# Patient Record
Sex: Female | Born: 1985 | Race: Black or African American | Hispanic: No | Marital: Single | Smoking: Current every day smoker
Health system: Southern US, Community
[De-identification: ages and names within clinical notes are randomized; demographics above are authoritative.]

## PROBLEM LIST (undated history)

## (undated) ENCOUNTER — Inpatient Hospital Stay (HOSPITAL_COMMUNITY): Payer: Self-pay

## (undated) DIAGNOSIS — B9689 Other specified bacterial agents as the cause of diseases classified elsewhere: Secondary | ICD-10-CM

## (undated) DIAGNOSIS — N760 Acute vaginitis: Secondary | ICD-10-CM

## (undated) DIAGNOSIS — R51 Headache: Secondary | ICD-10-CM

## (undated) DIAGNOSIS — A599 Trichomoniasis, unspecified: Secondary | ICD-10-CM

## (undated) DIAGNOSIS — D649 Anemia, unspecified: Secondary | ICD-10-CM

## (undated) DIAGNOSIS — A749 Chlamydial infection, unspecified: Secondary | ICD-10-CM

## (undated) DIAGNOSIS — B999 Unspecified infectious disease: Secondary | ICD-10-CM

## (undated) DIAGNOSIS — J309 Allergic rhinitis, unspecified: Secondary | ICD-10-CM

## (undated) HISTORY — PX: WISDOM TOOTH EXTRACTION: SHX21

---
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
## 2005-03-05 ENCOUNTER — Ambulatory Visit: Payer: Self-pay | Admitting: Family Medicine

## 2005-05-24 ENCOUNTER — Ambulatory Visit: Payer: Self-pay | Admitting: Family Medicine

## 2005-08-30 ENCOUNTER — Ambulatory Visit: Payer: Self-pay | Admitting: Family Medicine

## 2005-11-29 ENCOUNTER — Ambulatory Visit: Payer: Self-pay | Admitting: Family Medicine

## 2006-01-31 ENCOUNTER — Ambulatory Visit: Payer: Self-pay | Admitting: Family Medicine

## 2006-02-18 ENCOUNTER — Ambulatory Visit: Payer: Self-pay | Admitting: Sports Medicine

## 2006-04-19 ENCOUNTER — Encounter (INDEPENDENT_AMBULATORY_CARE_PROVIDER_SITE_OTHER): Payer: Self-pay | Admitting: *Deleted

## 2006-04-19 LAB — CONVERTED CEMR LAB

## 2006-05-05 ENCOUNTER — Ambulatory Visit: Payer: Self-pay | Admitting: Family Medicine

## 2006-05-06 ENCOUNTER — Ambulatory Visit: Payer: Self-pay | Admitting: Family Medicine

## 2006-07-18 ENCOUNTER — Encounter (INDEPENDENT_AMBULATORY_CARE_PROVIDER_SITE_OTHER): Payer: Self-pay | Admitting: *Deleted

## 2006-07-23 ENCOUNTER — Ambulatory Visit: Payer: Self-pay | Admitting: Family Medicine

## 2006-07-29 ENCOUNTER — Telehealth: Payer: Self-pay | Admitting: *Deleted

## 2006-08-06 ENCOUNTER — Telehealth: Payer: Self-pay | Admitting: *Deleted

## 2006-08-27 ENCOUNTER — Encounter (INDEPENDENT_AMBULATORY_CARE_PROVIDER_SITE_OTHER): Payer: Self-pay | Admitting: Family Medicine

## 2006-08-27 ENCOUNTER — Ambulatory Visit: Payer: Self-pay | Admitting: Family Medicine

## 2006-08-27 LAB — CONVERTED CEMR LAB
Chlamydia, DNA Probe: NEGATIVE
Hepatitis B Surface Ag: NEGATIVE
Whiff Test: POSITIVE

## 2006-10-16 ENCOUNTER — Ambulatory Visit: Payer: Self-pay | Admitting: Family Medicine

## 2006-10-17 ENCOUNTER — Telehealth: Payer: Self-pay | Admitting: *Deleted

## 2006-10-17 ENCOUNTER — Emergency Department (HOSPITAL_COMMUNITY): Admission: AC | Admit: 2006-10-17 | Discharge: 2006-10-17 | Payer: Self-pay

## 2007-01-07 ENCOUNTER — Ambulatory Visit: Payer: Self-pay | Admitting: Family Medicine

## 2007-01-16 ENCOUNTER — Telehealth (INDEPENDENT_AMBULATORY_CARE_PROVIDER_SITE_OTHER): Payer: Self-pay | Admitting: *Deleted

## 2007-01-16 ENCOUNTER — Ambulatory Visit: Payer: Self-pay | Admitting: Family Medicine

## 2007-01-16 LAB — CONVERTED CEMR LAB: Whiff Test: POSITIVE

## 2007-03-26 ENCOUNTER — Ambulatory Visit: Payer: Self-pay | Admitting: Family Medicine

## 2007-04-07 ENCOUNTER — Encounter (INDEPENDENT_AMBULATORY_CARE_PROVIDER_SITE_OTHER): Payer: Self-pay | Admitting: Family Medicine

## 2007-04-07 ENCOUNTER — Other Ambulatory Visit: Admission: RE | Admit: 2007-04-07 | Discharge: 2007-04-07 | Payer: Self-pay | Admitting: Family Medicine

## 2007-04-07 ENCOUNTER — Ambulatory Visit: Payer: Self-pay | Admitting: Family Medicine

## 2007-04-07 LAB — CONVERTED CEMR LAB: Whiff Test: POSITIVE

## 2007-05-04 ENCOUNTER — Ambulatory Visit: Payer: Self-pay | Admitting: Family Medicine

## 2007-06-08 ENCOUNTER — Ambulatory Visit: Payer: Self-pay | Admitting: Family Medicine

## 2007-06-08 ENCOUNTER — Telehealth: Payer: Self-pay | Admitting: *Deleted

## 2007-06-08 LAB — CONVERTED CEMR LAB: Beta hcg, urine, semiquantitative: NEGATIVE

## 2007-06-12 ENCOUNTER — Ambulatory Visit: Payer: Self-pay | Admitting: Family Medicine

## 2007-06-12 ENCOUNTER — Encounter: Payer: Self-pay | Admitting: Family Medicine

## 2007-06-12 LAB — CONVERTED CEMR LAB
Hepatitis B Surface Ag: NEGATIVE
Whiff Test: POSITIVE

## 2007-07-15 ENCOUNTER — Telehealth: Payer: Self-pay | Admitting: *Deleted

## 2007-07-16 ENCOUNTER — Encounter (INDEPENDENT_AMBULATORY_CARE_PROVIDER_SITE_OTHER): Payer: Self-pay | Admitting: Family Medicine

## 2007-07-16 ENCOUNTER — Ambulatory Visit: Payer: Self-pay | Admitting: Family Medicine

## 2007-07-16 LAB — CONVERTED CEMR LAB: Chlamydia, DNA Probe: POSITIVE — AB

## 2007-07-17 ENCOUNTER — Encounter: Payer: Self-pay | Admitting: *Deleted

## 2007-07-29 ENCOUNTER — Ambulatory Visit: Payer: Self-pay | Admitting: Family Medicine

## 2007-09-21 ENCOUNTER — Encounter (INDEPENDENT_AMBULATORY_CARE_PROVIDER_SITE_OTHER): Payer: Self-pay | Admitting: *Deleted

## 2007-09-29 ENCOUNTER — Telehealth: Payer: Self-pay | Admitting: *Deleted

## 2007-10-01 ENCOUNTER — Ambulatory Visit: Payer: Self-pay | Admitting: Family Medicine

## 2007-10-26 ENCOUNTER — Ambulatory Visit: Payer: Self-pay | Admitting: Sports Medicine

## 2007-10-26 LAB — CONVERTED CEMR LAB: Beta hcg, urine, semiquantitative: NEGATIVE

## 2007-11-16 ENCOUNTER — Encounter: Payer: Self-pay | Admitting: *Deleted

## 2007-11-23 ENCOUNTER — Encounter (INDEPENDENT_AMBULATORY_CARE_PROVIDER_SITE_OTHER): Payer: Self-pay | Admitting: *Deleted

## 2007-12-02 ENCOUNTER — Encounter: Payer: Self-pay | Admitting: Family Medicine

## 2007-12-02 ENCOUNTER — Ambulatory Visit: Payer: Self-pay | Admitting: Family Medicine

## 2007-12-08 ENCOUNTER — Telehealth: Payer: Self-pay | Admitting: Family Medicine

## 2008-01-18 ENCOUNTER — Ambulatory Visit: Payer: Self-pay | Admitting: Family Medicine

## 2008-01-26 ENCOUNTER — Telehealth: Payer: Self-pay | Admitting: *Deleted

## 2008-01-27 ENCOUNTER — Ambulatory Visit: Payer: Self-pay | Admitting: Family Medicine

## 2008-01-27 ENCOUNTER — Encounter (INDEPENDENT_AMBULATORY_CARE_PROVIDER_SITE_OTHER): Payer: Self-pay | Admitting: Family Medicine

## 2008-01-27 LAB — CONVERTED CEMR LAB: Beta hcg, urine, semiquantitative: NEGATIVE

## 2008-01-28 ENCOUNTER — Telehealth: Payer: Self-pay | Admitting: *Deleted

## 2008-01-29 ENCOUNTER — Ambulatory Visit: Payer: Self-pay | Admitting: Family Medicine

## 2008-02-18 ENCOUNTER — Emergency Department (HOSPITAL_COMMUNITY): Admission: EM | Admit: 2008-02-18 | Discharge: 2008-02-18 | Payer: Self-pay | Admitting: Emergency Medicine

## 2008-04-11 ENCOUNTER — Emergency Department (HOSPITAL_COMMUNITY): Admission: EM | Admit: 2008-04-11 | Discharge: 2008-04-11 | Payer: Self-pay | Admitting: Emergency Medicine

## 2008-04-25 ENCOUNTER — Telehealth: Payer: Self-pay | Admitting: *Deleted

## 2008-04-25 ENCOUNTER — Encounter (INDEPENDENT_AMBULATORY_CARE_PROVIDER_SITE_OTHER): Payer: Self-pay | Admitting: *Deleted

## 2008-05-06 ENCOUNTER — Ambulatory Visit: Payer: Self-pay | Admitting: Family Medicine

## 2008-05-06 ENCOUNTER — Encounter: Payer: Self-pay | Admitting: Family Medicine

## 2008-05-06 LAB — CONVERTED CEMR LAB: GC Probe Amp, Genital: NEGATIVE

## 2008-05-09 ENCOUNTER — Encounter: Payer: Self-pay | Admitting: Family Medicine

## 2008-05-11 ENCOUNTER — Emergency Department (HOSPITAL_COMMUNITY): Admission: EM | Admit: 2008-05-11 | Discharge: 2008-05-11 | Payer: Self-pay | Admitting: Emergency Medicine

## 2008-07-15 ENCOUNTER — Telehealth: Payer: Self-pay | Admitting: *Deleted

## 2008-07-26 ENCOUNTER — Emergency Department (HOSPITAL_COMMUNITY): Admission: EM | Admit: 2008-07-26 | Discharge: 2008-07-26 | Payer: Self-pay | Admitting: Emergency Medicine

## 2008-08-05 ENCOUNTER — Encounter (INDEPENDENT_AMBULATORY_CARE_PROVIDER_SITE_OTHER): Payer: Self-pay | Admitting: Family Medicine

## 2008-08-17 ENCOUNTER — Ambulatory Visit: Payer: Self-pay | Admitting: Family Medicine

## 2008-08-17 ENCOUNTER — Encounter: Payer: Self-pay | Admitting: *Deleted

## 2008-08-17 ENCOUNTER — Encounter (INDEPENDENT_AMBULATORY_CARE_PROVIDER_SITE_OTHER): Payer: Self-pay | Admitting: Family Medicine

## 2008-08-17 LAB — CONVERTED CEMR LAB
Basophils Relative: 0 % (ref 0–1)
Eosinophils Absolute: 0.4 10*3/uL (ref 0.0–0.7)
Lymphs Abs: 2.7 10*3/uL (ref 0.7–4.0)
MCHC: 34.9 g/dL (ref 30.0–36.0)
MCV: 78.8 fL (ref 78.0–100.0)
Neutrophils Relative %: 67 % (ref 43–77)
Platelets: 220 10*3/uL (ref 150–400)
RDW: 13.8 % (ref 11.5–15.5)
WBC: 12 10*3/uL — ABNORMAL HIGH (ref 4.0–10.5)

## 2008-08-18 ENCOUNTER — Encounter (INDEPENDENT_AMBULATORY_CARE_PROVIDER_SITE_OTHER): Payer: Self-pay | Admitting: Family Medicine

## 2008-08-18 ENCOUNTER — Inpatient Hospital Stay (HOSPITAL_COMMUNITY): Admission: AD | Admit: 2008-08-18 | Discharge: 2008-08-18 | Payer: Self-pay | Admitting: Obstetrics & Gynecology

## 2008-08-19 ENCOUNTER — Inpatient Hospital Stay (HOSPITAL_COMMUNITY): Admission: AD | Admit: 2008-08-19 | Discharge: 2008-08-19 | Payer: Self-pay | Admitting: Obstetrics & Gynecology

## 2008-08-22 ENCOUNTER — Telehealth: Payer: Self-pay | Admitting: *Deleted

## 2008-08-25 ENCOUNTER — Emergency Department (HOSPITAL_COMMUNITY): Admission: EM | Admit: 2008-08-25 | Discharge: 2008-08-25 | Payer: Self-pay | Admitting: Emergency Medicine

## 2008-08-25 ENCOUNTER — Telehealth: Payer: Self-pay | Admitting: *Deleted

## 2008-08-26 ENCOUNTER — Ambulatory Visit (HOSPITAL_COMMUNITY): Admission: RE | Admit: 2008-08-26 | Discharge: 2008-08-26 | Payer: Self-pay | Admitting: Family Medicine

## 2008-08-26 ENCOUNTER — Encounter (INDEPENDENT_AMBULATORY_CARE_PROVIDER_SITE_OTHER): Payer: Self-pay | Admitting: Family Medicine

## 2008-08-29 ENCOUNTER — Encounter: Payer: Self-pay | Admitting: *Deleted

## 2008-08-30 ENCOUNTER — Ambulatory Visit: Payer: Self-pay | Admitting: Family Medicine

## 2008-09-08 ENCOUNTER — Inpatient Hospital Stay (HOSPITAL_COMMUNITY): Admission: AD | Admit: 2008-09-08 | Discharge: 2008-09-08 | Payer: Self-pay | Admitting: Family Medicine

## 2008-09-08 ENCOUNTER — Ambulatory Visit: Payer: Self-pay | Admitting: Advanced Practice Midwife

## 2008-09-12 ENCOUNTER — Ambulatory Visit: Payer: Self-pay | Admitting: Family Medicine

## 2008-09-12 ENCOUNTER — Encounter: Payer: Self-pay | Admitting: Family Medicine

## 2008-09-12 ENCOUNTER — Other Ambulatory Visit: Admission: RE | Admit: 2008-09-12 | Discharge: 2008-09-12 | Payer: Self-pay | Admitting: Family Medicine

## 2008-09-12 DIAGNOSIS — O99320 Drug use complicating pregnancy, unspecified trimester: Secondary | ICD-10-CM

## 2008-09-12 DIAGNOSIS — F1910 Other psychoactive substance abuse, uncomplicated: Secondary | ICD-10-CM

## 2008-09-12 DIAGNOSIS — F17200 Nicotine dependence, unspecified, uncomplicated: Secondary | ICD-10-CM

## 2008-09-12 HISTORY — DX: Other psychoactive substance abuse, uncomplicated: F19.10

## 2008-09-12 HISTORY — DX: Drug use complicating pregnancy, unspecified trimester: O99.320

## 2008-09-12 LAB — CONVERTED CEMR LAB
BUN: 8 mg/dL (ref 6–23)
Bilirubin Urine: NEGATIVE
Calcium: 9.8 mg/dL (ref 8.4–10.5)
Creatinine, Ser: 0.72 mg/dL (ref 0.40–1.20)
Potassium: 3.8 meq/L (ref 3.5–5.3)
TSH: 0.07 microintl units/mL — ABNORMAL LOW (ref 0.350–4.500)
Urobilinogen, UA: 1
pH: 6

## 2008-09-13 ENCOUNTER — Encounter: Payer: Self-pay | Admitting: Family Medicine

## 2008-09-14 ENCOUNTER — Ambulatory Visit: Payer: Self-pay | Admitting: Family Medicine

## 2008-09-14 ENCOUNTER — Encounter: Payer: Self-pay | Admitting: Family Medicine

## 2008-09-14 LAB — CONVERTED CEMR LAB: Free T4: 1.71 ng/dL (ref 0.80–1.80)

## 2008-09-19 ENCOUNTER — Ambulatory Visit: Payer: Self-pay | Admitting: Family Medicine

## 2008-09-19 ENCOUNTER — Encounter: Payer: Self-pay | Admitting: Family Medicine

## 2008-09-27 ENCOUNTER — Telehealth (INDEPENDENT_AMBULATORY_CARE_PROVIDER_SITE_OTHER): Payer: Self-pay | Admitting: Family Medicine

## 2008-09-30 ENCOUNTER — Inpatient Hospital Stay (HOSPITAL_COMMUNITY): Admission: AD | Admit: 2008-09-30 | Discharge: 2008-10-01 | Payer: Self-pay | Admitting: Obstetrics & Gynecology

## 2008-10-05 ENCOUNTER — Encounter (INDEPENDENT_AMBULATORY_CARE_PROVIDER_SITE_OTHER): Payer: Self-pay | Admitting: *Deleted

## 2008-10-31 ENCOUNTER — Ambulatory Visit: Payer: Self-pay | Admitting: Family Medicine

## 2008-10-31 ENCOUNTER — Encounter: Payer: Self-pay | Admitting: Family Medicine

## 2008-10-31 LAB — CONVERTED CEMR LAB: GC Probe Amp, Genital: NEGATIVE

## 2008-11-02 ENCOUNTER — Encounter: Payer: Self-pay | Admitting: Family Medicine

## 2008-11-07 ENCOUNTER — Telehealth: Payer: Self-pay | Admitting: Family Medicine

## 2008-11-07 ENCOUNTER — Ambulatory Visit: Payer: Self-pay | Admitting: Family Medicine

## 2008-11-16 ENCOUNTER — Telehealth: Payer: Self-pay | Admitting: Family Medicine

## 2008-11-24 ENCOUNTER — Telehealth: Payer: Self-pay | Admitting: Family Medicine

## 2008-11-25 ENCOUNTER — Encounter: Payer: Self-pay | Admitting: Family Medicine

## 2008-11-25 ENCOUNTER — Telehealth: Payer: Self-pay | Admitting: Family Medicine

## 2008-11-25 ENCOUNTER — Ambulatory Visit: Payer: Self-pay | Admitting: Family Medicine

## 2008-11-25 DIAGNOSIS — R109 Unspecified abdominal pain: Secondary | ICD-10-CM

## 2008-12-05 ENCOUNTER — Ambulatory Visit: Payer: Self-pay | Admitting: Family Medicine

## 2008-12-09 ENCOUNTER — Encounter: Payer: Self-pay | Admitting: Family Medicine

## 2008-12-09 ENCOUNTER — Ambulatory Visit (HOSPITAL_COMMUNITY): Admission: RE | Admit: 2008-12-09 | Discharge: 2008-12-09 | Payer: Self-pay | Admitting: Family Medicine

## 2008-12-15 ENCOUNTER — Telehealth: Payer: Self-pay | Admitting: Family Medicine

## 2008-12-19 ENCOUNTER — Ambulatory Visit: Payer: Self-pay | Admitting: Family Medicine

## 2009-01-04 ENCOUNTER — Telehealth (INDEPENDENT_AMBULATORY_CARE_PROVIDER_SITE_OTHER): Payer: Self-pay | Admitting: *Deleted

## 2009-01-13 ENCOUNTER — Encounter: Payer: Self-pay | Admitting: *Deleted

## 2009-01-18 ENCOUNTER — Encounter: Payer: Self-pay | Admitting: *Deleted

## 2009-01-18 ENCOUNTER — Ambulatory Visit: Payer: Self-pay | Admitting: Family Medicine

## 2009-01-18 ENCOUNTER — Encounter: Payer: Self-pay | Admitting: Family Medicine

## 2009-01-18 LAB — CONVERTED CEMR LAB
HCT: 32 % — ABNORMAL LOW (ref 36.0–46.0)
Hemoglobin: 10.1 g/dL — ABNORMAL LOW (ref 12.0–15.0)
RDW: 14.9 % (ref 11.5–15.5)

## 2009-01-19 ENCOUNTER — Ambulatory Visit (HOSPITAL_COMMUNITY): Admission: RE | Admit: 2009-01-19 | Discharge: 2009-01-19 | Payer: Self-pay | Admitting: Family Medicine

## 2009-01-19 ENCOUNTER — Encounter: Payer: Self-pay | Admitting: Family Medicine

## 2009-01-21 ENCOUNTER — Inpatient Hospital Stay (HOSPITAL_COMMUNITY): Admission: AD | Admit: 2009-01-21 | Discharge: 2009-01-22 | Payer: Self-pay | Admitting: Family Medicine

## 2009-01-21 ENCOUNTER — Ambulatory Visit: Payer: Self-pay | Admitting: Advanced Practice Midwife

## 2009-02-03 ENCOUNTER — Telehealth: Payer: Self-pay | Admitting: Family Medicine

## 2009-02-04 ENCOUNTER — Inpatient Hospital Stay (HOSPITAL_COMMUNITY): Admission: AD | Admit: 2009-02-04 | Discharge: 2009-02-05 | Payer: Self-pay | Admitting: Obstetrics & Gynecology

## 2009-02-04 ENCOUNTER — Ambulatory Visit: Payer: Self-pay | Admitting: Obstetrics and Gynecology

## 2009-02-19 ENCOUNTER — Inpatient Hospital Stay (HOSPITAL_COMMUNITY): Admission: AD | Admit: 2009-02-19 | Discharge: 2009-02-19 | Payer: Self-pay | Admitting: Obstetrics and Gynecology

## 2009-02-19 ENCOUNTER — Ambulatory Visit: Payer: Self-pay | Admitting: Obstetrics and Gynecology

## 2009-02-23 ENCOUNTER — Telehealth: Payer: Self-pay | Admitting: Family Medicine

## 2009-02-24 ENCOUNTER — Ambulatory Visit: Payer: Self-pay | Admitting: Family Medicine

## 2009-02-24 ENCOUNTER — Encounter: Payer: Self-pay | Admitting: Family Medicine

## 2009-03-08 ENCOUNTER — Encounter: Payer: Self-pay | Admitting: Family Medicine

## 2009-03-08 ENCOUNTER — Ambulatory Visit: Payer: Self-pay | Admitting: Family Medicine

## 2009-03-10 ENCOUNTER — Inpatient Hospital Stay (HOSPITAL_COMMUNITY): Admission: AD | Admit: 2009-03-10 | Discharge: 2009-03-11 | Payer: Self-pay | Admitting: Obstetrics & Gynecology

## 2009-03-20 ENCOUNTER — Encounter: Payer: Self-pay | Admitting: Family Medicine

## 2009-03-20 ENCOUNTER — Ambulatory Visit: Payer: Self-pay | Admitting: Family Medicine

## 2009-03-20 DIAGNOSIS — A5901 Trichomonal vulvovaginitis: Secondary | ICD-10-CM | POA: Insufficient documentation

## 2009-03-20 LAB — CONVERTED CEMR LAB
ALT: 11 units/L (ref 0–35)
AST: 16 units/L (ref 0–37)
Albumin: 3.7 g/dL (ref 3.5–5.2)
Bilirubin Urine: NEGATIVE
Blood in Urine, dipstick: NEGATIVE
Calcium: 9.3 mg/dL (ref 8.4–10.5)
Chloride: 105 meq/L (ref 96–112)
GC Probe Amp, Genital: NEGATIVE
Glucose, Urine, Semiquant: NEGATIVE
Platelets: 191 10*3/uL (ref 150–400)
Potassium: 4 meq/L (ref 3.5–5.3)
Protein, U semiquant: NEGATIVE
RDW: 15 % (ref 11.5–15.5)
Sodium: 138 meq/L (ref 135–145)
WBC, UA: >20 cells/hpf
pH: 7

## 2009-03-21 ENCOUNTER — Encounter: Payer: Self-pay | Admitting: Family Medicine

## 2009-03-21 ENCOUNTER — Ambulatory Visit: Payer: Self-pay | Admitting: Physician Assistant

## 2009-03-21 ENCOUNTER — Inpatient Hospital Stay (HOSPITAL_COMMUNITY): Admission: AD | Admit: 2009-03-21 | Discharge: 2009-03-22 | Payer: Self-pay | Admitting: Obstetrics & Gynecology

## 2009-03-27 ENCOUNTER — Encounter: Payer: Self-pay | Admitting: Family Medicine

## 2009-03-27 ENCOUNTER — Ambulatory Visit: Payer: Self-pay | Admitting: Family Medicine

## 2009-04-03 ENCOUNTER — Inpatient Hospital Stay (HOSPITAL_COMMUNITY): Admission: AD | Admit: 2009-04-03 | Discharge: 2009-04-03 | Payer: Self-pay | Admitting: Obstetrics & Gynecology

## 2009-04-03 ENCOUNTER — Telehealth: Payer: Self-pay | Admitting: *Deleted

## 2009-04-04 ENCOUNTER — Encounter: Payer: Self-pay | Admitting: Family Medicine

## 2009-04-04 ENCOUNTER — Inpatient Hospital Stay (HOSPITAL_COMMUNITY): Admission: AD | Admit: 2009-04-04 | Discharge: 2009-04-06 | Payer: Self-pay | Admitting: Family Medicine

## 2009-04-04 ENCOUNTER — Ambulatory Visit: Payer: Self-pay | Admitting: Family Medicine

## 2009-04-17 ENCOUNTER — Inpatient Hospital Stay (HOSPITAL_COMMUNITY): Admission: AD | Admit: 2009-04-17 | Discharge: 2009-04-17 | Payer: Self-pay | Admitting: Obstetrics & Gynecology

## 2009-07-02 ENCOUNTER — Emergency Department (HOSPITAL_COMMUNITY): Admission: EM | Admit: 2009-07-02 | Discharge: 2009-07-02 | Payer: Self-pay | Admitting: Family Medicine

## 2009-07-10 ENCOUNTER — Ambulatory Visit: Payer: Self-pay | Admitting: Family Medicine

## 2009-07-19 ENCOUNTER — Telehealth: Payer: Self-pay | Admitting: *Deleted

## 2009-08-19 IMAGING — US US OB COMP +14 WK
2 of 3 series · 14 of 28 positions shown · non-contrast
Comparison: none

OBSTETRICAL ULTRASOUND:
 This ultrasound exam was performed in the [HOSPITAL] Ultrasound Department.  The OB US report was generated in the AS system, and faxed to the ordering physician.  This report is also available in [REDACTED] PACS.

[Series 1: us ob comp +14 wk · 0.21mm/px · 76 acquisitions, 13 frames shown (1 of 2)]
[im 4/76]
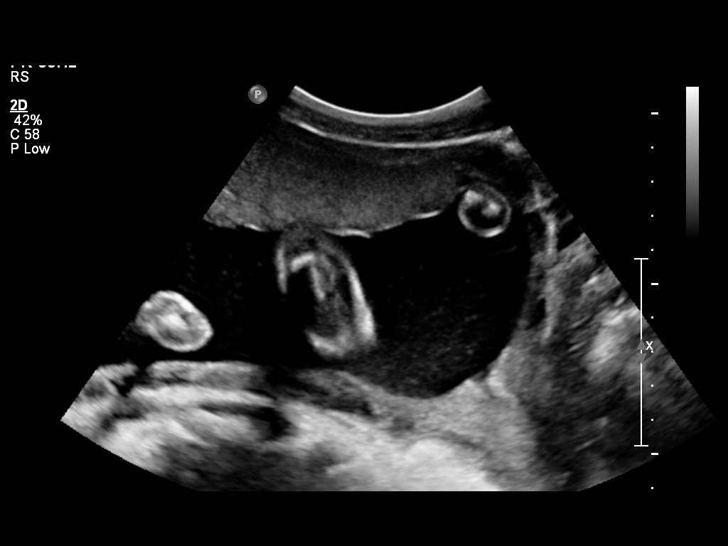
[im 10/76]
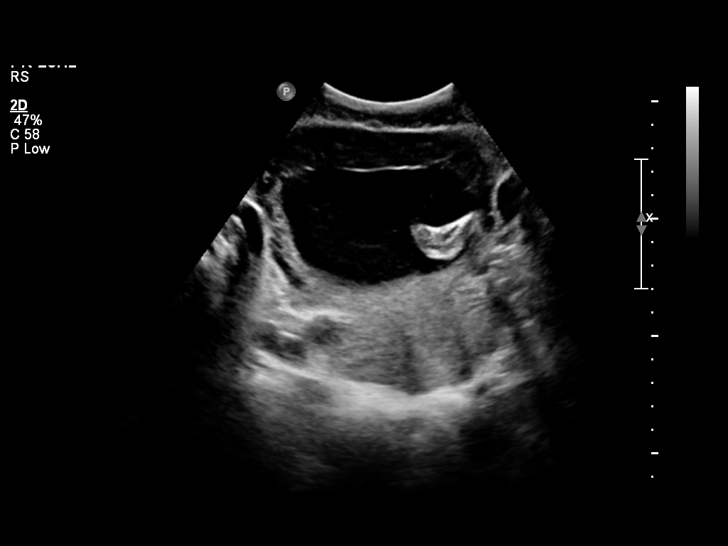
[im 16/76]
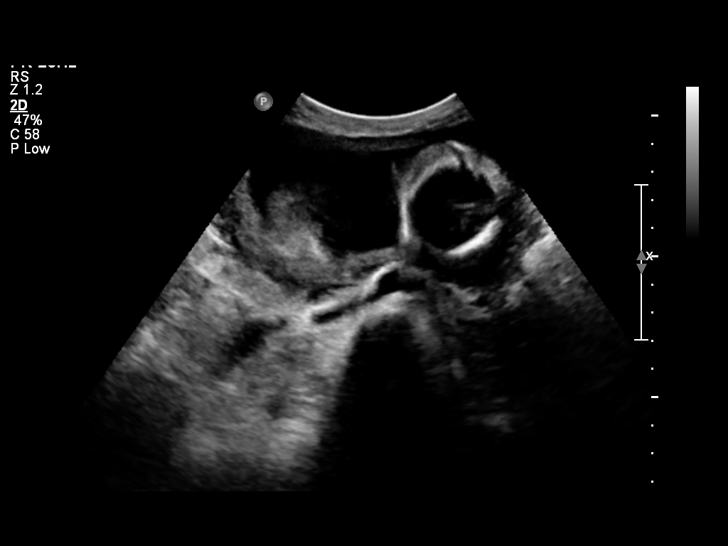
[im 22/76]
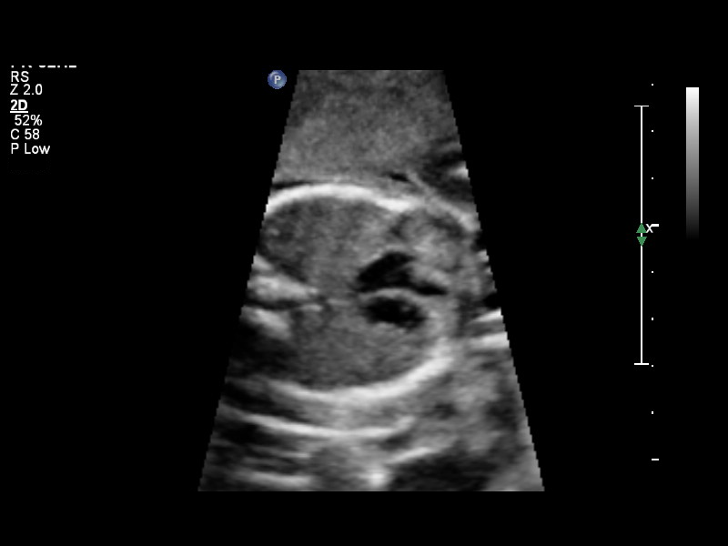
[im 28/76]
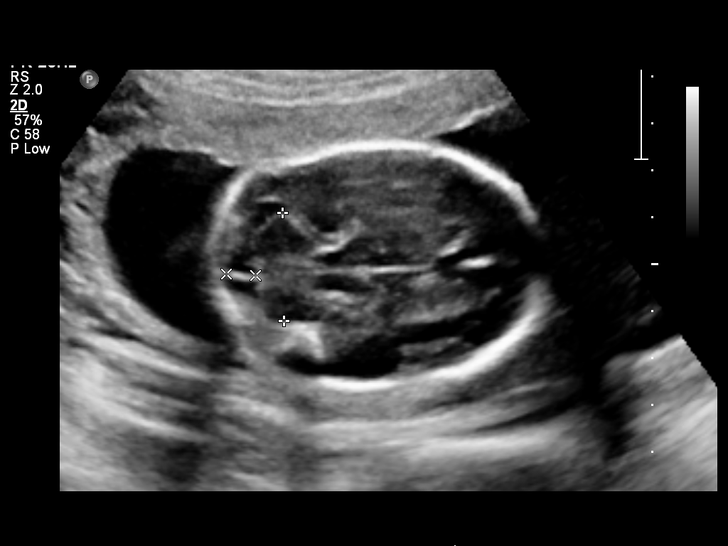
[im 34/76]
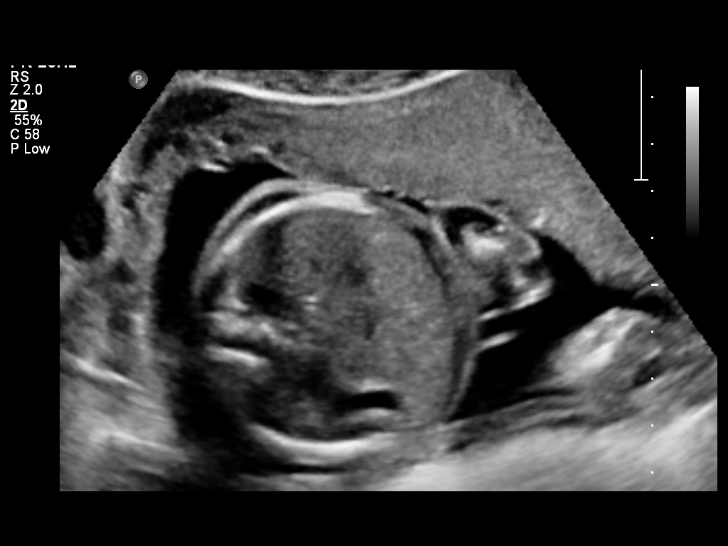
[im 40/76]
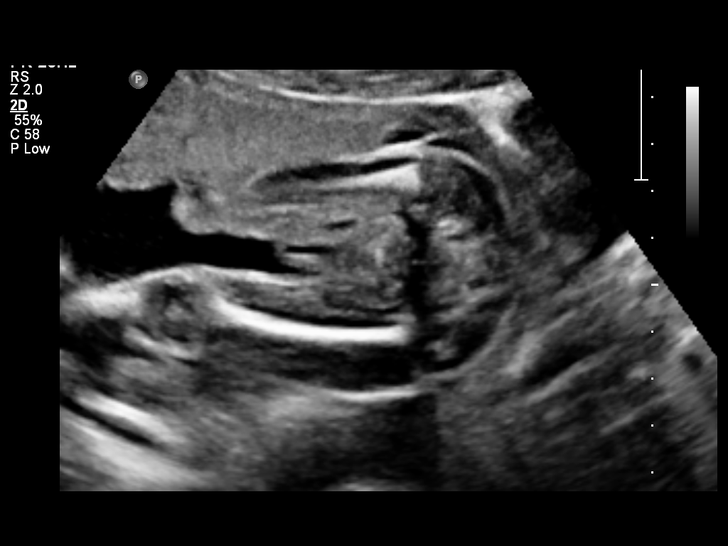
[im 46/76]
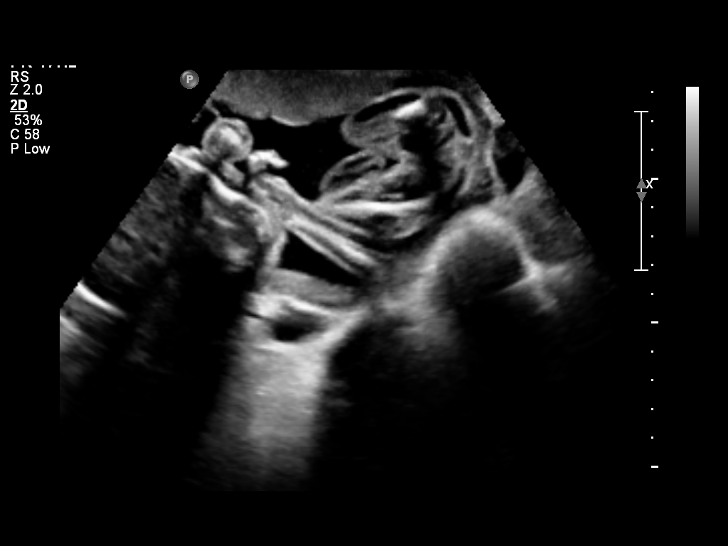
[im 52/76]
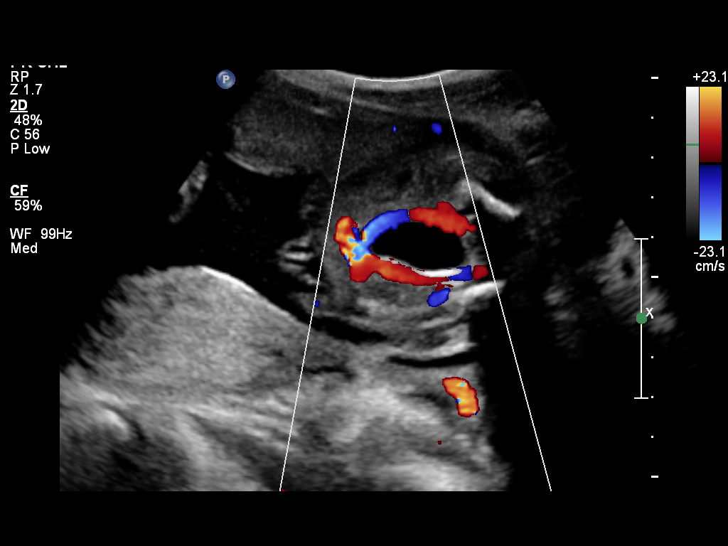
[im 58/76]
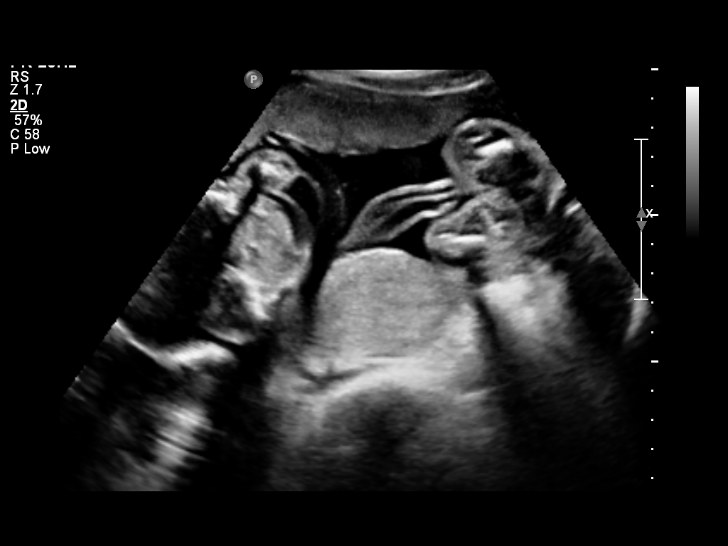
[im 64/76]
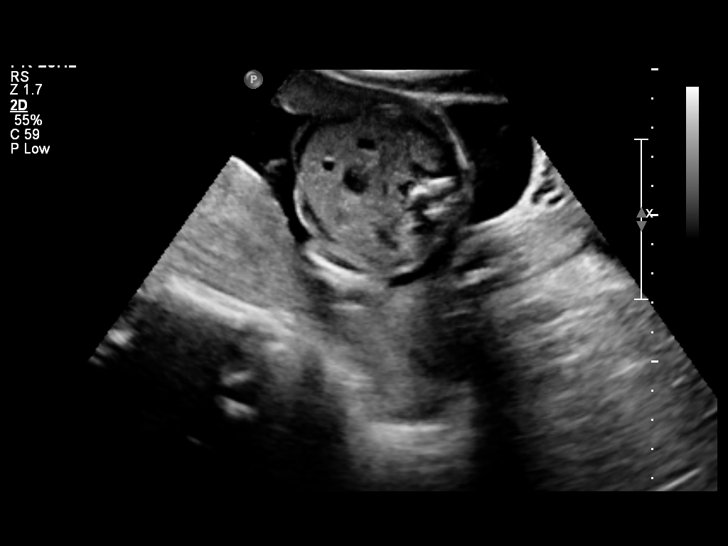
[im 70/76]
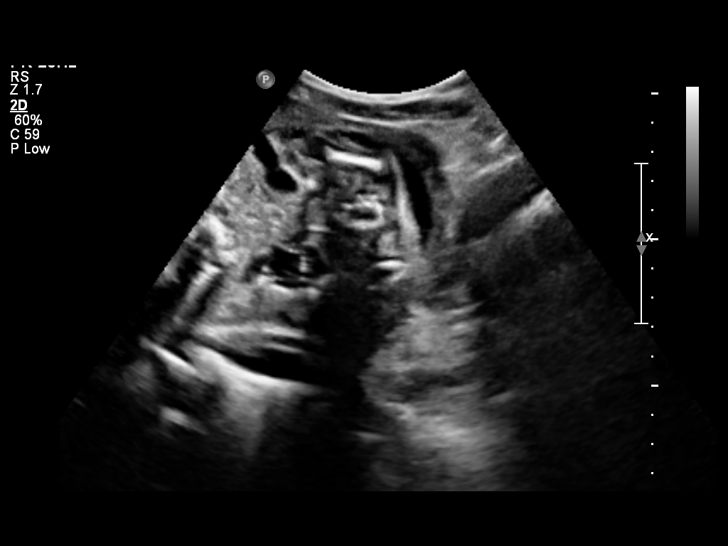
[im 76/76]
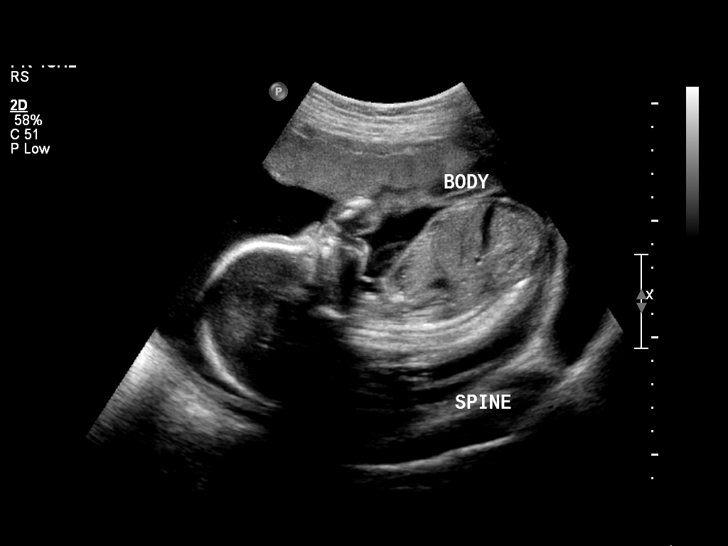

[Series 1: us ob comp +14 wk · 0.21mm/px · 1 of 3 slices shown (2 of 2)]
[im 1/3]
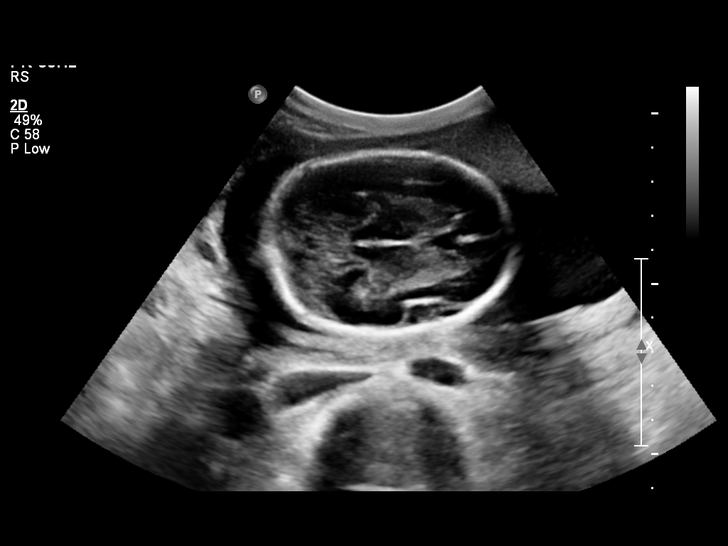

[14 of 28 positions shown; findings below may reference images not displayed]

IMPRESSION: See AS Obstetric US report.

## 2009-10-02 IMAGING — CR DG FOOT 2V*L*
2 series · 2 of 2 positions shown · non-contrast
Comparison: 09/02/2004.  Left ankle series from the same day.

CLINICAL DATA: 22-year-old female status post fall with pain and
swelling at the top the left foot.  29 weeks pregnant.

LEFT FOOT - 2 VIEW

[view not recorded (1 of 2)]
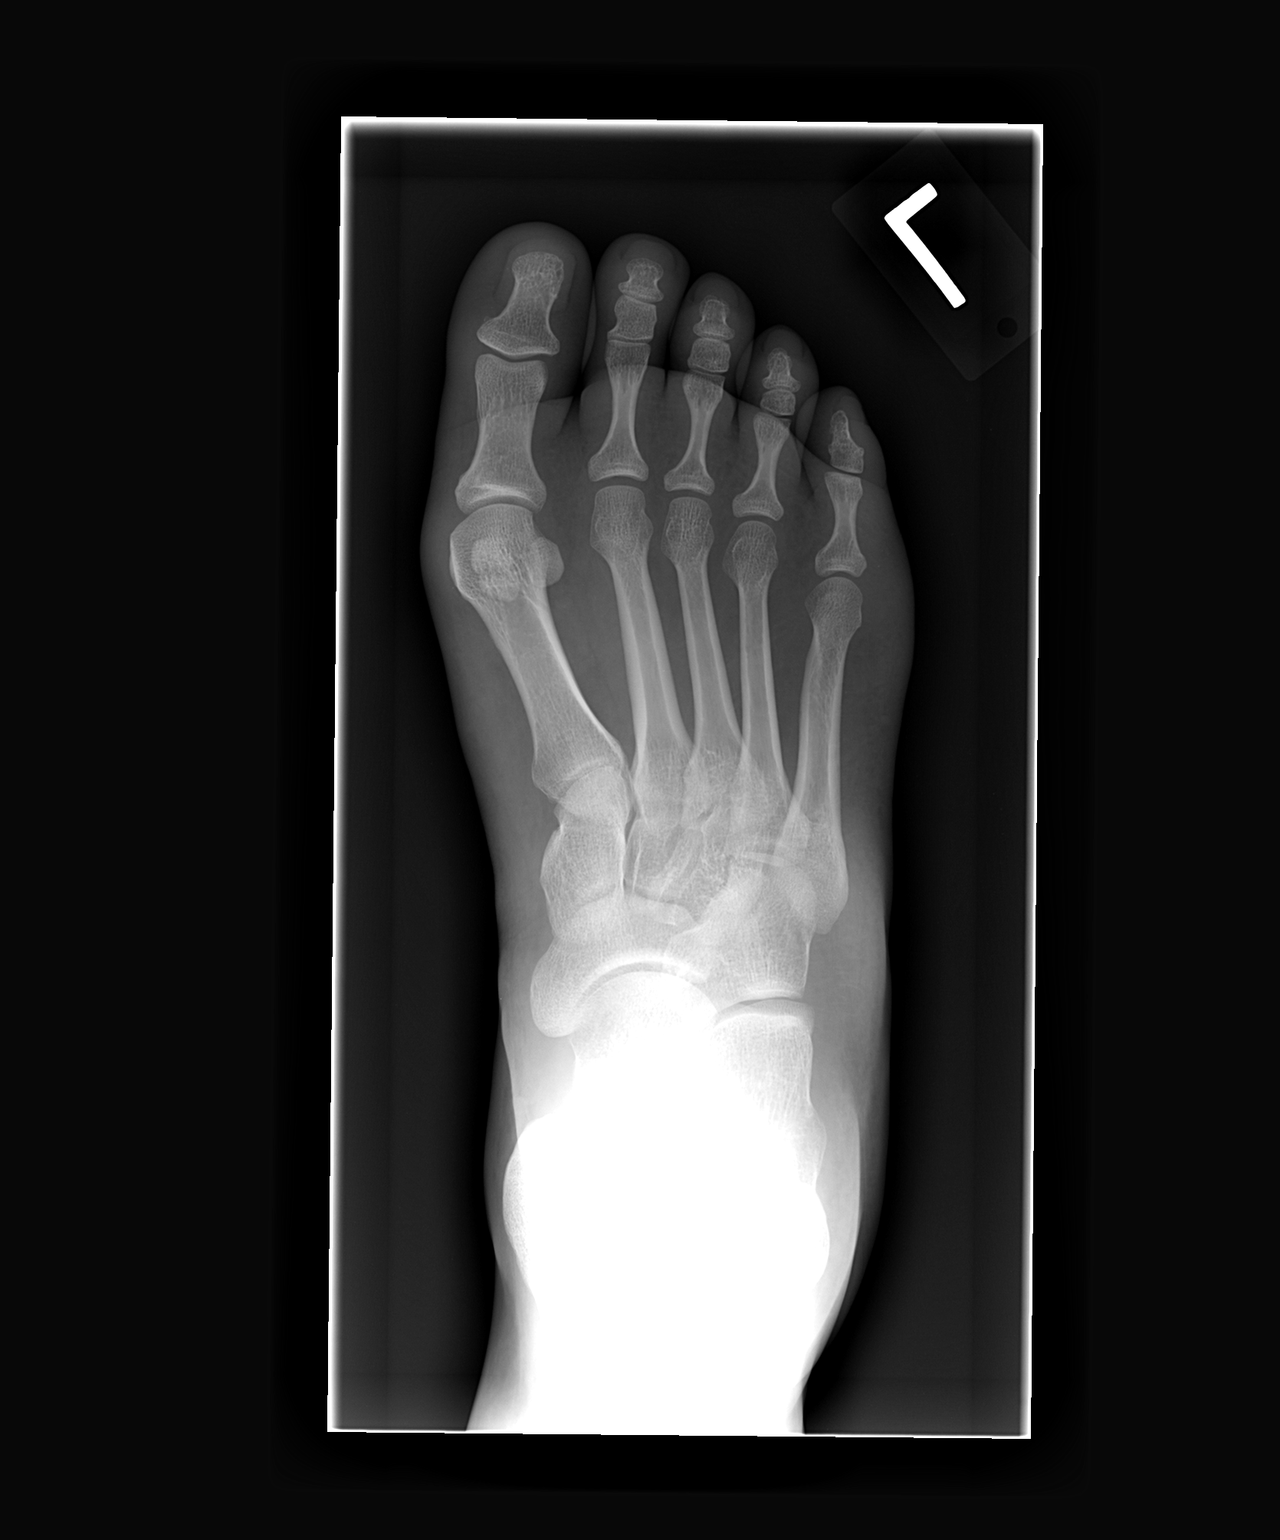

[view not recorded (2 of 2)]
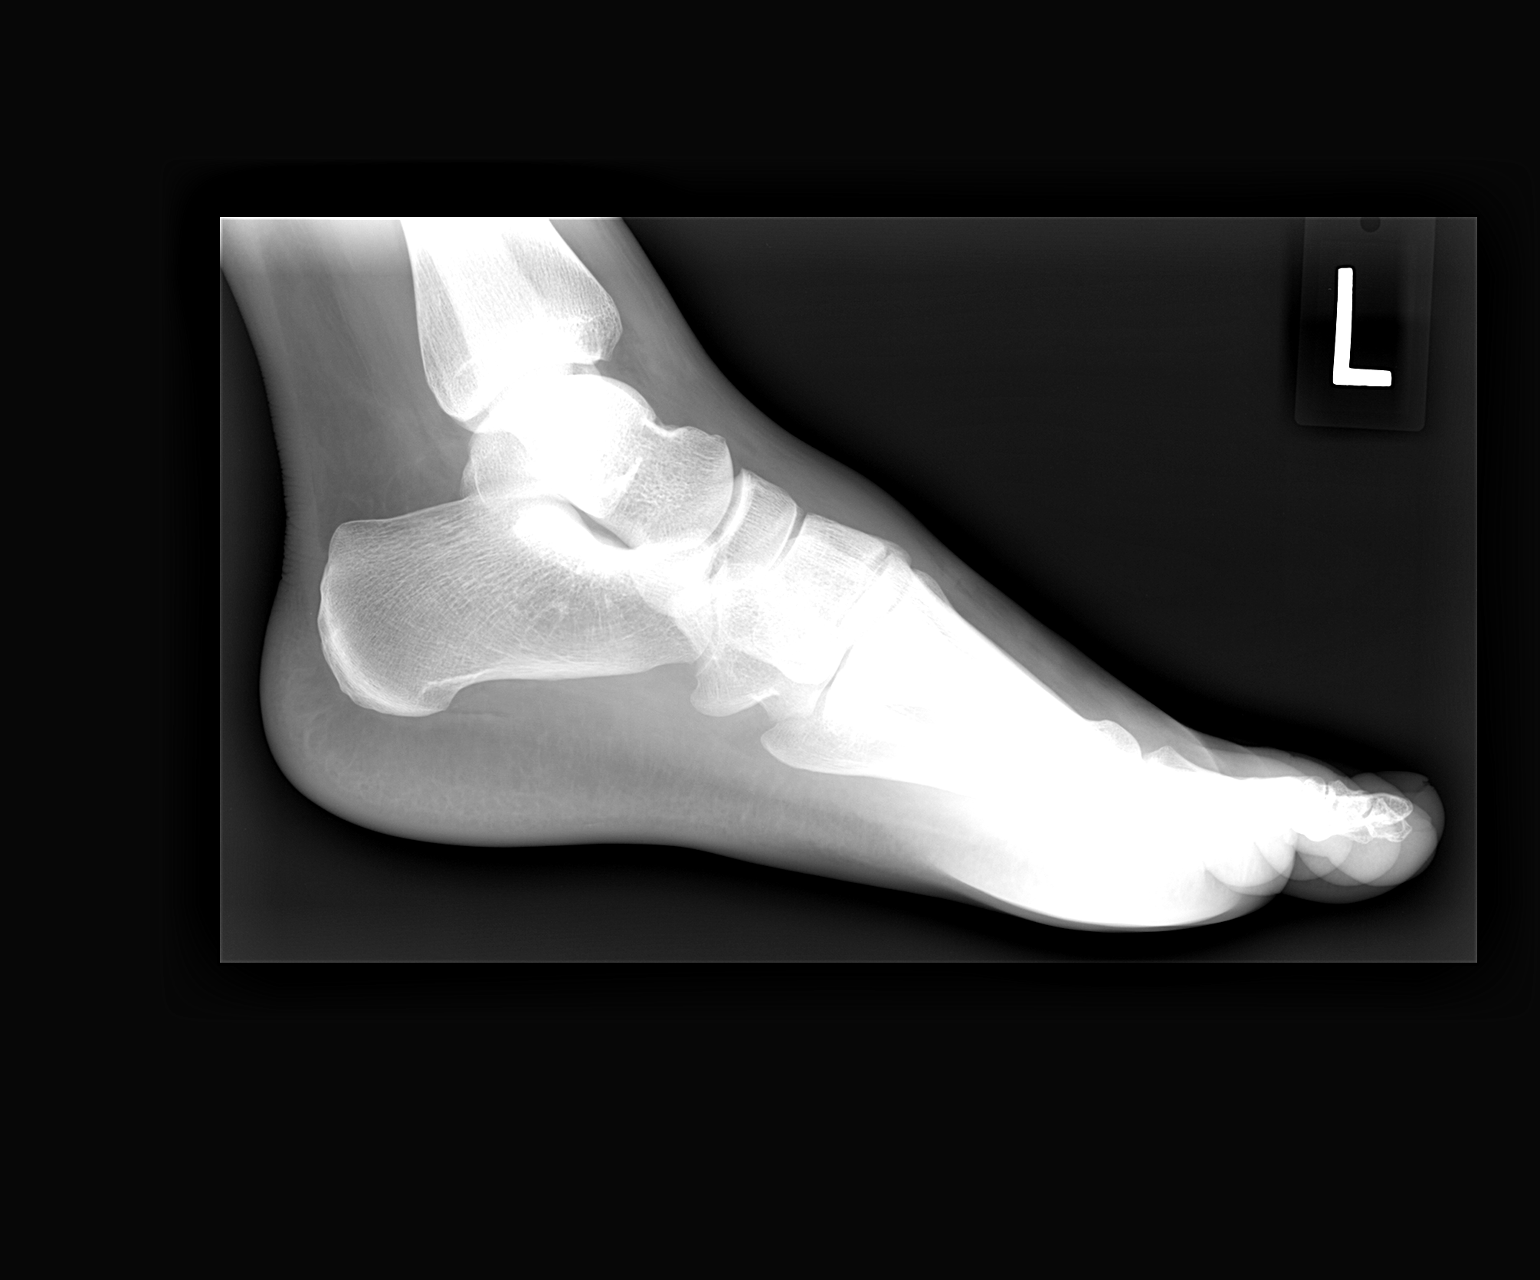

[2 of 2 positions shown; findings below may reference images not displayed]

FINDINGS: Bone mineralization is within normal limits.  Joint
spaces are normal.  There is soft tissue swelling at the dorsal
foot overlying the level of the tarsal bones.  No acute fracture
identified.  Calcaneus appears intact.
IMPRESSION: Soft tissue swelling. No acute fracture or dislocation identified
about the left foot.

## 2009-10-13 ENCOUNTER — Ambulatory Visit: Payer: Self-pay | Admitting: Family Medicine

## 2009-12-15 ENCOUNTER — Encounter: Payer: Self-pay | Admitting: *Deleted

## 2009-12-18 ENCOUNTER — Encounter: Payer: Self-pay | Admitting: *Deleted

## 2009-12-27 ENCOUNTER — Emergency Department (HOSPITAL_COMMUNITY): Admission: EM | Admit: 2009-12-27 | Discharge: 2009-12-28 | Payer: Self-pay | Admitting: Emergency Medicine

## 2009-12-28 ENCOUNTER — Ambulatory Visit: Payer: Self-pay | Admitting: Family Medicine

## 2010-03-26 ENCOUNTER — Ambulatory Visit: Payer: Self-pay | Admitting: Family Medicine

## 2010-03-26 ENCOUNTER — Encounter: Payer: Self-pay | Admitting: *Deleted

## 2010-04-18 ENCOUNTER — Encounter: Payer: Self-pay | Admitting: Family Medicine

## 2010-04-18 ENCOUNTER — Other Ambulatory Visit
Admission: RE | Admit: 2010-04-18 | Discharge: 2010-04-18 | Payer: Self-pay | Source: Home / Self Care | Admitting: Family Medicine

## 2010-04-18 ENCOUNTER — Ambulatory Visit: Payer: Self-pay | Admitting: Family Medicine

## 2010-04-18 DIAGNOSIS — N760 Acute vaginitis: Secondary | ICD-10-CM | POA: Insufficient documentation

## 2010-04-18 LAB — CONVERTED CEMR LAB
Chlamydia, DNA Probe: NEGATIVE
HIV: NONREACTIVE
Whiff Test: POSITIVE

## 2010-04-20 ENCOUNTER — Encounter: Payer: Self-pay | Admitting: Family Medicine

## 2010-06-10 ENCOUNTER — Encounter: Payer: Self-pay | Admitting: Family Medicine

## 2010-06-11 ENCOUNTER — Encounter: Payer: Self-pay | Admitting: Family Medicine

## 2010-06-13 ENCOUNTER — Ambulatory Visit: Admit: 2010-06-13 | Payer: Self-pay

## 2010-06-21 NOTE — Letter (Signed)
Summary: Probation Letter  [facility]  [address]   [place], [place] [address]   Phone: [phone]  Fax: [phone]    12/18/2009  Buffie IYLA BALZARINI [address] [place], [place]  [address]  Dear Ms. Rudin,  With the goal of better serving all our patients the [facility] is following each patient's missed appointments.  You have missed at least 3 appointments with our practice.If you cannot keep your appointment, we expect you to call at least 24 hours before your appointment time.  Missing appointments prevents other patients from seeing [place] and makes it difficult to provide you with the best possible medical care.      1.   If you miss one more appointment, we will only give you limited medical services. This means we will not call in medication refills, complete a form, or make a referral for you except when you are here for a scheduled office visit.    2.   If you miss 2 or more appointments in the next year, we will dismiss you from our practice.    Our office staff can be reached at [phone] Monday through Friday from 8:30 a.m.-5:00 p.m. and will be glad to schedule your appointment as necessary.    Thank you.   [facility]

## 2010-06-21 NOTE — Progress Notes (Signed)
Summary: triage  Phone Note Call from Patient Call back at Home Phone [phone]   Caller: Patient Summary of Call: Pt is pregnant and wondering if she can take Extra Strength Tylenol Initial call taken by: Clydell Hakim,  July 19, 2009 3:33 PM  Follow-up for Phone Call        Told her that ES Tylenol was ok. Follow-up by: Dennison Nancy RN,  July 19, 2009 4:10 PM

## 2010-06-21 NOTE — Letter (Signed)
Summary: Suspension Letter  [facility]  [address]   [place], [place] [address]   Phone: [phone]  Fax: [phone]    03/26/2010  Erika Porter [address] [name], [place]  [address]  Dear Ms. Parthasarathy,  You have missed 4 scheduled appointments with our practice.If you cannot keep your appointment, we expect you to call and cancel at least 24 hours before your appointment time.  As per our policy, we will now only give you limited medical services. means we will not call in a refill for you, or complete a form or make a referral except when you are here for a scheduled office visit.   If you miss 2 more appointments in the next year, we will dismiss you from our practice.  We hope this does not happen.  If you keep your appointments for the next year you will be returned to regular patient status.  We hope these changes will encourage you to keep your appointments so we may provide you the best medical care.   Our office staff can be reached at [phone] Monday through Friday from 8:30 a.m.-5:00 p.m. and will be glad to schedule your appointment as necessary.     Sincerely,   [facility]

## 2010-06-21 NOTE — Miscellaneous (Signed)
Summary: PT NEEDS APPT/TS  Clinical Lists Changes called pt x 3. sounds like a child picks up the phone, but does not talk or respond...will try again later. pt has been missing several appt's and need to be seen asap.Arlyss Repress CMA,  December 15, 2009 10:24 AM called pt again. same situation.Arlyss Repress CMA,  December 15, 2009 12:02 PM

## 2010-06-21 NOTE — Assessment & Plan Note (Signed)
Summary: depo inj,tcb  Nurse Visit   Vitals Entered By: Arlyss Repress CMA, (December 28, 2009 1:57 PM)  Allergies: No Known Drug Allergies  Medication Administration  Injection # 1:    Medication: Depo-Provera 150mg     Diagnosis: CONTRACEPTION COUNSELING/FAMILY PLANNING (ICD-V25.09)    Route: IM    Site: L thigh    Exp Date: 07/18/2012    Lot #: [id number]    Mfr: [email or web address]    Comments: NEXT DEPO DUE OCT 29 THROUGH NOV 01/2010. REMINDER CARD GIVEN TO PT.    Patient tolerated injection without complications    Given by: Arlyss Repress CMA, (December 28, 2009 2:00 PM)  Orders Added: 1)  Depo-Provera 150mg  [J1055] 2)  Est Level 1- [facility] [[id number]]   Medication Administration  Injection # 1:    Medication: Depo-Provera 150mg     Diagnosis: CONTRACEPTION COUNSELING/FAMILY PLANNING (ICD-V25.09)    Route: IM    Site: L thigh    Exp Date: 07/18/2012    Lot #: [id number]    Mfr: [email or web address]    Comments: NEXT DEPO DUE OCT 29 THROUGH NOV 01/2010. REMINDER CARD GIVEN TO PT.    Patient tolerated injection without complications    Given by: Arlyss Repress CMA, (December 28, 2009 2:00 PM)  Orders Added: 1)  Depo-Provera 150mg  [J1055] 2)  Est Level 1- [facility] [[id number]]

## 2010-06-21 NOTE — Assessment & Plan Note (Signed)
Summary: depo,df  Nurse Visit   Allergies: No Known Drug Allergies  Medication Administration  Injection # 1:    Medication: Depo-Provera 150mg     Diagnosis: CONTRACEPTION COUNSELING/FAMILY PLANNING (ICD-V25.09)    Route: IM    Site: L deltoid    Exp Date: 08/2011    Lot #: [id number]    Mfr: Pharmacia    Comments: next depo due August 12 thru January 12, 2010    Patient tolerated injection without complications    Given by: Theresia Lo RN (Oct 13, 2009 8:45 AM)  Orders Added: 1)  Depo-Provera 150mg  [J1055] 2)  Admin of Injection (IM/SQ) [82956]   Medication Administration  Injection # 1:    Medication: Depo-Provera 150mg     Diagnosis: CONTRACEPTION COUNSELING/FAMILY PLANNING (ICD-V25.09)    Route: IM    Site: L deltoid    Exp Date: 08/2011    Lot #: [id number]    Mfr: Pharmacia    Comments: next depo due August 12 thru January 12, 2010    Patient tolerated injection without complications    Given by: Theresia Lo RN (Oct 13, 2009 8:45 AM)  Orders Added: 1)  Depo-Provera 150mg  [J1055] 2)  Admin of Injection (IM/SQ) [[id number]]

## 2010-06-21 NOTE — Letter (Signed)
Summary: Results Follow-up Letter  [facility]  [address]   [place], [place] [address]   Phone: [phone]  Fax: [phone]    04/20/2010  [address] [name], [place]  [address]  Dear Ms. Erika Porter,   I have been trying to call you, but your phone number is disconnected.  The following are the results of your recent test(s):  Test     Result     Pap Smear    Normal  Vaginal fluid smear: you have a bacterial infection, called bacterial vaginosis.  This is not a sexually transmitted disease.  This can be treated with an antibiotic called Metronidazole 500mg  by mouth two times a day for 7 days. I have sent a prescription to your pharmacy.  HIV: negative  Syphilis: negative  Gonorrhea: negative  Chlamydia: negative    Sincerely,  Cat Ta MD [facility]           Appended Document: Results Follow-up Letter mailed

## 2010-06-21 NOTE — Assessment & Plan Note (Signed)
Summary: pp ck,df   Vital Signs:  Patient profile:   25 year old female Height:      66 inches Weight:      147.8 pounds BMI:     23.94 Temp:     98.1 degrees F oral Pulse rate:   123 / minute BP sitting:   113 / 74  (right arm) Cuff size:   regular  Vitals Entered By: Garen Grams LPN (July 10, 2009 3:09 PM) CC: postpartum check Is Patient Diabetic? No Pain Assessment Patient in pain? no        Primary Care Provider:  Lupita Raider MD  CC:  postpartum check.  History of Present Illness: 1. Postpartum check: -Contraception: received depo while in the hospital would like to continue that -Vaginal bleeding: none -Mood: not depressed -Other: diagnosed with trich 1 week ago by UC, hasn't filled Rx yet  Current Problems (verified): 1)  Vaginal Trichomoniasis  (ICD-131.01) 2)  Hypertension, Gestational  (ICD-642.90) 3)  Supervision, Normal Pregnancy, Multiparous  (ICD-V22.1) 4)  Pelvic Pain  (ICD-789.09) 5)  Late Vomiting Pregnancy Unspec As Episode Care  (ICD-643.20) 6)  Tobacco User  (ICD-305.1) 7)  Substance Abuse  (ICD-305.90) 8)  Hyperemesis Gravidarum  (ICD-643.10) 9)  Pregnant State, Incidental  (ICD-V22.2) 10)  Gonococcal Infection Lower Genitourinary Tract  (ICD-098.0) 11)  Bacterial Vaginitis  (ICD-616.10) 12)  Screening For Malignant Neoplasm of The Cervix  (ICD-V76.2) 13)  Communicable Disease, Exposure To  (ICD-V01.9) 14)  Contraception Counseling/family Planning  (ICD-V25.09)  Current Medications (verified): 1)  Zantac 75 75 Mg Tabs (Ranitidine Hcl) .... Take 1 Tab By Mouth Daily 2)  Metronidazole 500 Mg Tabs (Metronidazole) .... 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 Smokes MJ and cig daily.  Sexually active, does not always use condoms.  Denies etoh/other drugs.  Lives w/ mom, sons, and daughter.    Physical Exam  General:  Vital signs reviewed.  Normotensive.  Alert, well-nourished, and well-hydrated.   Head:  normocephalic and atraumatic.   Eyes:  vision grossly intact, pupils equal, pupils round, and pupils reactive to light.   Mouth:  pharynx pink and moist.   Neck:  supple and no masses.   Lungs:  normal respiratory effort, normal breath sounds, no dullness, no crackles, and no wheezes.   Heart:  normal rate, regular rhythm, and no murmur.   Abdomen:  soft and non-tender, non-distended, normal bowel sounds   Extremities:  No lower extremity edema Psych:  not anxious appearing and not depressed appearing.     Impression & Recommendations:  Problem # 1:  POSTPARTUM EXAMINATION (ICD-V24.2) Assessment New  Routine postpartum care.  Continue Depo.  Will need pap in April.  [facility]  Problem # 2:  VAGINAL TRICHOMONIASIS (ICD-131.01) Assessment: New  Encouraged her to be sure to fill Rx. for antibiotics.  Currently asymptomatic.  [facility]  Complete Medication List: 1)  Zantac 75 75 Mg Tabs (Ranitidine hcl) .... Take 1 tab by mouth daily 2)  Metronidazole 500 Mg Tabs (Metronidazole) .... Take 1 tab by mouth twice a day  Other Orders: U Preg-[facility] ([address]) Depo-Provera 150mg  (T2671)  Patient Instructions: 1)  It was good  to see you today 2)  It is important for you to go take the antibiotic that was prescribed for the Trich infection 3)  Continue coming in every 3 months for the Depo injection 4)  Please follow up with me 1 week after your treatment for Trich to make sure that it was cleared  Laboratory Results   Urine Tests  Date/Time Received: July 10, 2009 3:42 PM  Date/Time Reported: July 10, 2009 3:58 PM     Urine HCG: negative Comments: ...............test performed by......Marland KitchenBonnie A. [place], MLS (ASCP)cm      Medication Administration  Injection # 1:    Medication: Depo-Provera 150mg     Diagnosis: CONTRACEPTION COUNSELING/FAMILY PLANNING (ICD-V25.09)    Route: IM    Site: RUOQ gluteus    Exp Date: 09/18/2011    Lot #: [id number]    Mfr: greenstone    Comments: Next depo due May 9 - May 23.    Patient tolerated injection without complications    Given by: Jone Baseman CMA (July 10, 2009 4:02 PM)  Orders Added: 1)  U Preg-[facility] [81025] 2)  Depo-Provera 150mg  [J1055] 3)  Postpartum visit- [facility] [[id number]]

## 2010-06-21 NOTE — Assessment & Plan Note (Signed)
Summary: Vaginal discharge, STD   Vital Signs:  Patient profile:   25 year old female Height:      66 inches Weight:      148 pounds BMI:     23.97 Temp:     98.5 degrees F oral Pulse rate:   108 / minute BP sitting:   129 / 80  (right arm) Cuff size:   regular  Vitals Entered By: Tessie Fass CMA (April 18, 2010 4:11 PM) CC: Vaginal discharge, STD check  Is Patient Diabetic? No Pain Assessment Patient in pain? no        Primary Care Kalese Ensz:  Cat Ta MD  CC:  Vaginal discharge and STD check .  History of Present Illness: 25 y/o F with history of high risk sexual behavior is here for STD check and vaginal discharge.      She has a new sexual partner.  Last sexual intercourse was 5 days ago, Used condom, but it broke.  On Depo for contraception.     She developed vaginal discharge x 1 months now.  Sometimes malodorous. Sometimes she feel pruritis.  She has history of trich and is concerned about another infection.    No fever, chills, abd pain, abdnormal vaginal bleeding, nausea, vomiting, pain with intercourse.  Not on any medications except Depo.   Habits & Providers  Alcohol-Tobacco-Diet     Tobacco Status: current     Tobacco Counseling: to quit use of tobacco products  Allergies (verified): No Known Drug Allergies  Past History:  Past Surgical History: Last updated: 07/17/2006 HPV screening = high risk HPV - 05/15/2006  Family History: Last updated: 08/27/2006 father - unkknown mother- anemia, depression sisters - asthma brothers - healthy  Social History: Last updated: 04/18/2010 Lives in [place].  Lives with her 3 children (2 son, 1 daughter).    Tobacco: sporadically Sexually active, does not always use condoms.   Denies etoh/other drugs.    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  Lives with her 3 children (2 son, 1 daughter).    Tobacco: sporadically Sexually active, does not always use condoms.   Denies etoh/other drugs.    Employement: [employer] as Economist: 9th gradeSmoking Status:  current  Review of Systems       per hpi   Physical Exam  General:  Well-developed,well-nourished,in no acute distress; alert,appropriate and cooperative throughout examination. Vitals reviewed.  Lungs:  Normal respiratory effort, chest expands symmetrically. Lungs are clear to auscultation, no crackles or wheezes. Abdomen:  Bowel sounds positive,abdomen soft and non-tender without masses, organomegaly or hernias noted. Genitalia:  Normal introitus for age, no external lesions, Positive vaginal discharge, + malodor, mucosa pink and moist, no vaginal or cervical lesions, no vaginal atrophy, no friaility or hemorrhage, normal uterus size and position, no adnexal masses or tenderness Inguinal Nodes:  No significant adenopathy   Impression & Recommendations:  Problem # 1:  VAGINITIS (ICD-616.10) Assessment New Wet prep shows clue cells and +whiff.  Will treat with metronidazole 500mg  two times a day x 7 days.   The following medications were removed from the medication list:    Metronidazole 500 Mg Tabs (Metronidazole) .Marland Kitchen... Take 1 tab by mouth twice a day Her updated medication list for this problem includes:  Metronidazole 500 Mg Tabs (Metronidazole) .Marland Kitchen... 1 tab by mouth two times a day x 7 days  Orders: GC/Chlamydia-[facility] (87591/87491) Wet Prep- [facility] (40981) [facility]- Est Level  3 ([address])  Problem # 2:  CONTACT OR EXPOSURE TO OTHER VIRAL DISEASES (ICD-V01.79) Assessment: New Cervical check for GG/Chla.  Will also check for HIV and RPR.    Orders: HIV-[facility] ([id number]) RPR-[facility] [phone]) [facility]- Est Level  3 ([address])  Problem # 3:  Screening  Cervical Cancer (ICD-V76.2) History of high risk HPV 2007.  Negative paps since.  High risk sexual behaviors.   Complete Medication List: 1)  Depo-provera 150 Mg/ml Susp (Medroxyprogesterone acetate) .... As directed 2)  Metronidazole 500 Mg Tabs (Metronidazole) .Marland Kitchen.. 1 tab by mouth two times a day x 7 days  Other Orders: Pap Smear-[facility] ([id number]) Prescriptions: METRONIDAZOLE 500 MG TABS (METRONIDAZOLE) 1 tab by mouth two times a day x 7 days  #14 x 0   Entered and Authorized by:   Angeline Slim MD   Signed by:   Angeline Slim MD on 04/18/2010   Method used:   Electronically to        [facility] [address] [phone]* (retail)       [address]       [place], [place]  [address]       Ph: [phone]       Fax: [phone]   RxID:   [id number]    Orders Added: 1)  Pap Smear-[facility] [35573-22025] 2)  GC/Chlamydia-[facility] [87591/87491] 3)  Wet Prep- [facility] [87210] 4)  HIV-[facility] [42706-23762] 5)  RPR-[facility] [83151-76160] 6)  [facility]- Est Level  3 [73710]    Laboratory Results  Date/Time Received: April 18, 2010 4:38 PM  Date/Time Reported: April 18, 2010 4:43 PM   [employer] Source: vaginal WBC/hpf: 1-5 Bacteria/hpf: 3+  Cocci Clue cells/hpf: moderate  Positive whiff Yeast/hpf: none Trichomonas/hpf: none Comments: ...........test performed by...........Marland KitchenTerese Door, CMA

## 2010-06-21 NOTE — Assessment & Plan Note (Signed)
Summary: depo,df  Nurse Visit   Allergies: No Known Drug Allergies  Medication Administration  Injection # 1:    Medication: Depo-Provera 150mg     Diagnosis: CONTRACEPTION COUNSELING/FAMILY PLANNING (ICD-V25.09)    Route: IM    Site: L deltoid    Exp Date: 04/2012    Lot #: [id number]    Mfr: greenstone    Comments: next depo due Jan 23 thru Jun 25, 2009    Patient tolerated injection without complications    Given by: Theresia Lo RN (March 26, 2010 10:29 AM)  Orders Added: 1)  Depo-Provera 150mg  [J1055] 2)  Admin of Injection (IM/SQ) [04540]   Medication Administration  Injection # 1:    Medication: Depo-Provera 150mg     Diagnosis: CONTRACEPTION COUNSELING/FAMILY PLANNING (ICD-V25.09)    Route: IM    Site: L deltoid    Exp Date: 04/2012    Lot #: [id number]    Mfr: greenstone    Comments: next depo due Jan 23 thru Jun 25, 2009    Patient tolerated injection without complications    Given by: Theresia Lo RN (March 26, 2010 10:29 AM)  Orders Added: 1)  Depo-Provera 150mg  [J1055] 2)  Admin of Injection (IM/SQ) [47829]  patient given suspension letter and explained it to her. appointment scheduled for CPE 04/18/2010.  advised she will need to come in for CPE before next depo is due.Marland Kitchen Theresia Lo RN  March 26, 2010 10:37 AM

## 2010-07-11 ENCOUNTER — Telehealth: Payer: Self-pay | Admitting: Family Medicine

## 2010-07-11 ENCOUNTER — Emergency Department (HOSPITAL_COMMUNITY)
Admission: EM | Admit: 2010-07-11 | Discharge: 2010-07-11 | Disposition: A | Discharge status: Home / Self Care | Payer: Self-pay | Attending: Emergency Medicine | Admitting: Emergency Medicine

## 2010-07-11 DIAGNOSIS — N739 Female pelvic inflammatory disease, unspecified: Secondary | ICD-10-CM | POA: Insufficient documentation

## 2010-07-11 LAB — URINALYSIS, ROUTINE W REFLEX MICROSCOPIC
Protein, ur: NEGATIVE mg/dL
Specific Gravity, Urine: 1.027 (ref 1.005–1.030)
Urine Glucose, Fasting: NEGATIVE mg/dL
Urobilinogen, UA: 0.2 mg/dL (ref 0.0–1.0)

## 2010-07-11 LAB — URINE MICROSCOPIC-ADD ON

## 2010-07-11 NOTE — Telephone Encounter (Signed)
Pt reporting a one week h/o diarrhea/loose BMs and abdominal pain over the past 2 to 3 days.  States that the pain happens quickly and feels sharp, like a gas pain then subsides.  No recent changes in her diet.  The only recent changes are coming off her depo injections.  Advised her that she should be seen for this and she states that she can't afford it, her Medicaid ran out.  She has reapplied and should get her card in about a week.  Told her the only thing I could do at this point was route this note to her PCP for advise and we would call her back.

## 2010-07-11 NOTE — Telephone Encounter (Signed)
Pt can go to ER if she is having intractable abd pain.  I cannot prescribe anything without seeing pt.

## 2010-07-11 NOTE — Telephone Encounter (Signed)
Patient did go to ED after we spoke.  She was tested for STDs, all of which were negative.  They gave her 2 prescriptions for antibiotics and pain medication.  I instructed her to call us once her medicaid was reinstated to make an appt.  Pt agreable.

## 2010-07-11 NOTE — Telephone Encounter (Signed)
Having lower abd pain since 2-3 days - not sure what she needs to do.

## 2010-07-12 LAB — GC/CHLAMYDIA PROBE AMP, GENITAL
Chlamydia, DNA Probe: NEGATIVE
GC Probe Amp, Genital: NEGATIVE

## 2010-08-03 LAB — URINE MICROSCOPIC-ADD ON

## 2010-08-03 LAB — CBC
HCT: 38.7 % (ref 36.0–46.0)
Hemoglobin: 13.2 g/dL (ref 12.0–15.0)
RBC: 4.97 MIL/uL (ref 3.87–5.11)
RDW: 13.7 % (ref 11.5–15.5)
WBC: 9.8 10*3/uL (ref 4.0–10.5)

## 2010-08-03 LAB — URINALYSIS, ROUTINE W REFLEX MICROSCOPIC
Nitrite: NEGATIVE
Specific Gravity, Urine: 1.037 — ABNORMAL HIGH (ref 1.005–1.030)
Urobilinogen, UA: 1 mg/dL (ref 0.0–1.0)
pH: 5.5 (ref 5.0–8.0)

## 2010-08-03 LAB — URINE CULTURE

## 2010-08-03 LAB — DIFFERENTIAL
Basophils Relative: 0 % (ref 0–1)
Eosinophils Absolute: 0.1 10*3/uL (ref 0.0–0.7)
Monocytes Absolute: 0.3 10*3/uL (ref 0.1–1.0)
Monocytes Relative: 3 % (ref 3–12)
Neutrophils Relative %: 83 % — ABNORMAL HIGH (ref 43–77)

## 2010-08-03 LAB — POCT PREGNANCY, URINE: Preg Test, Ur: NEGATIVE

## 2010-08-03 LAB — COMPREHENSIVE METABOLIC PANEL
ALT: 26 U/L (ref 0–35)
Alkaline Phosphatase: 56 U/L (ref 39–117)
Chloride: 101 mEq/L (ref 96–112)
Glucose, Bld: 95 mg/dL (ref 70–99)
Potassium: 3.6 mEq/L (ref 3.5–5.1)
Sodium: 132 mEq/L — ABNORMAL LOW (ref 135–145)
Total Protein: 8 g/dL (ref 6.0–8.3)

## 2010-08-08 LAB — POCT URINALYSIS DIP (DEVICE)
Hgb urine dipstick: NEGATIVE
Protein, ur: NEGATIVE mg/dL
Specific Gravity, Urine: 1.02 (ref 1.005–1.030)
Urobilinogen, UA: 0.2 mg/dL (ref 0.0–1.0)

## 2010-08-08 LAB — GC/CHLAMYDIA PROBE AMP, GENITAL
Chlamydia, DNA Probe: NEGATIVE
GC Probe Amp, Genital: NEGATIVE

## 2010-08-08 LAB — WET PREP, GENITAL

## 2010-08-22 LAB — URINALYSIS, ROUTINE W REFLEX MICROSCOPIC
Bilirubin Urine: NEGATIVE
Hgb urine dipstick: NEGATIVE
Nitrite: NEGATIVE
Specific Gravity, Urine: 1.01 (ref 1.005–1.030)
pH: 7.5 (ref 5.0–8.0)

## 2010-08-22 LAB — URINE MICROSCOPIC-ADD ON

## 2010-08-22 LAB — RPR: RPR Ser Ql: NONREACTIVE

## 2010-08-22 LAB — CBC
HCT: 30 % — ABNORMAL LOW (ref 36.0–46.0)
Hemoglobin: 9.9 g/dL — ABNORMAL LOW (ref 12.0–15.0)
RBC: 4.34 MIL/uL (ref 3.87–5.11)
WBC: 10.4 10*3/uL (ref 4.0–10.5)
WBC: 9 10*3/uL (ref 4.0–10.5)

## 2010-08-23 LAB — URINALYSIS, ROUTINE W REFLEX MICROSCOPIC
Bilirubin Urine: NEGATIVE
Ketones, ur: NEGATIVE mg/dL
Ketones, ur: >80 mg/dL — AB
Nitrite: NEGATIVE
Nitrite: NEGATIVE
Protein, ur: NEGATIVE mg/dL
Specific Gravity, Urine: 1.025 (ref 1.005–1.030)
Urobilinogen, UA: 0.2 mg/dL (ref 0.0–1.0)
Urobilinogen, UA: 1 mg/dL (ref 0.0–1.0)
pH: 6 (ref 5.0–8.0)
pH: 7 (ref 5.0–8.0)

## 2010-08-23 LAB — RAPID URINE DRUG SCREEN, HOSP PERFORMED
Amphetamines: NOT DETECTED
Benzodiazepines: NOT DETECTED
Cocaine: NOT DETECTED
Tetrahydrocannabinol: POSITIVE — AB

## 2010-08-23 LAB — URINE MICROSCOPIC-ADD ON

## 2010-08-24 LAB — RAPID URINE DRUG SCREEN, HOSP PERFORMED
Amphetamines: NOT DETECTED
Barbiturates: NOT DETECTED
Benzodiazepines: NOT DETECTED
Opiates: NOT DETECTED

## 2010-08-26 ENCOUNTER — Inpatient Hospital Stay (INDEPENDENT_AMBULATORY_CARE_PROVIDER_SITE_OTHER)
Admission: RE | Admit: 2010-08-26 | Discharge: 2010-08-26 | Disposition: A | Discharge status: Home / Self Care | Payer: Self-pay | Source: Ambulatory Visit | Attending: Emergency Medicine | Admitting: Emergency Medicine

## 2010-08-26 DIAGNOSIS — N760 Acute vaginitis: Secondary | ICD-10-CM

## 2010-08-26 DIAGNOSIS — R197 Diarrhea, unspecified: Secondary | ICD-10-CM

## 2010-08-26 LAB — POCT URINALYSIS DIP (DEVICE)
Hgb urine dipstick: NEGATIVE
Nitrite: NEGATIVE
Protein, ur: NEGATIVE mg/dL
Urobilinogen, UA: 1 mg/dL (ref 0.0–1.0)
pH: 7 (ref 5.0–8.0)

## 2010-08-27 LAB — GC/CHLAMYDIA PROBE AMP, GENITAL
Chlamydia, DNA Probe: NEGATIVE
GC Probe Amp, Genital: NEGATIVE

## 2010-08-29 LAB — URINALYSIS, ROUTINE W REFLEX MICROSCOPIC
Glucose, UA: NEGATIVE mg/dL
Ketones, ur: 15 mg/dL — AB
Nitrite: NEGATIVE
Specific Gravity, Urine: >1.03 — ABNORMAL HIGH (ref 1.005–1.030)
Urobilinogen, UA: 0.2 mg/dL (ref 0.0–1.0)
pH: 6 (ref 5.0–8.0)

## 2010-08-29 LAB — URINE MICROSCOPIC-ADD ON

## 2010-08-29 LAB — URINE CULTURE: Colony Count: >=100000

## 2010-08-29 LAB — WET PREP, GENITAL

## 2010-08-29 LAB — GC/CHLAMYDIA PROBE AMP, GENITAL: Chlamydia, DNA Probe: POSITIVE — AB

## 2010-08-30 LAB — DIFFERENTIAL
Basophils Relative: 0 % (ref 0–1)
Eosinophils Absolute: 0.2 10*3/uL (ref 0.0–0.7)
Neutrophils Relative %: 56 % (ref 43–77)

## 2010-08-30 LAB — POCT I-STAT, CHEM 8
HCT: 38 % (ref 36.0–46.0)
Hemoglobin: 12.9 g/dL (ref 12.0–15.0)
Potassium: 3.2 mEq/L — ABNORMAL LOW (ref 3.5–5.1)
Sodium: 139 mEq/L (ref 135–145)

## 2010-08-30 LAB — RAPID URINE DRUG SCREEN, HOSP PERFORMED
Amphetamines: NOT DETECTED
Cocaine: POSITIVE — AB
Tetrahydrocannabinol: POSITIVE — AB

## 2010-08-30 LAB — POCT CARDIAC MARKERS
CKMB, poc: <1 ng/mL — ABNORMAL LOW (ref 1.0–8.0)
Myoglobin, poc: 94.2 ng/mL (ref 12–200)

## 2010-08-30 LAB — CBC
MCHC: 33.6 g/dL (ref 30.0–36.0)
MCV: 80.7 fL (ref 78.0–100.0)
Platelets: 160 10*3/uL (ref 150–400)
RDW: 14.2 % (ref 11.5–15.5)

## 2010-10-01 ENCOUNTER — Inpatient Hospital Stay (INDEPENDENT_AMBULATORY_CARE_PROVIDER_SITE_OTHER)
Admission: RE | Admit: 2010-10-01 | Discharge: 2010-10-01 | Disposition: A | Discharge status: Home / Self Care | Payer: Self-pay | Source: Ambulatory Visit | Attending: Family Medicine | Admitting: Family Medicine

## 2010-10-01 DIAGNOSIS — K5289 Other specified noninfective gastroenteritis and colitis: Secondary | ICD-10-CM

## 2010-10-01 LAB — POCT URINALYSIS DIP (DEVICE)
Glucose, UA: NEGATIVE mg/dL
Specific Gravity, Urine: >=1.03 (ref 1.005–1.030)
Urobilinogen, UA: 0.2 mg/dL (ref 0.0–1.0)

## 2010-10-01 LAB — CBC
MCV: 78 fL (ref 78.0–100.0)
Platelets: 228 10*3/uL (ref 150–400)
RBC: 4.55 MIL/uL (ref 3.87–5.11)
RDW: 13.6 % (ref 11.5–15.5)
WBC: 8.5 10*3/uL (ref 4.0–10.5)

## 2010-10-01 LAB — COMPREHENSIVE METABOLIC PANEL
ALT: 17 U/L (ref 0–35)
AST: 19 U/L (ref 0–37)
Albumin: 4.1 g/dL (ref 3.5–5.2)
Alkaline Phosphatase: 57 U/L (ref 39–117)
Chloride: 105 mEq/L (ref 96–112)
GFR calc Af Amer: >60 mL/min (ref >60)
Potassium: 3.6 mEq/L (ref 3.5–5.1)
Sodium: 139 mEq/L (ref 135–145)
Total Bilirubin: 0.3 mg/dL (ref 0.3–1.2)
Total Protein: 7.6 g/dL (ref 6.0–8.3)

## 2010-10-01 LAB — DIFFERENTIAL
Basophils Absolute: 0 10*3/uL (ref 0.0–0.1)
Basophils Relative: 0 % (ref 0–1)
Eosinophils Absolute: 0.1 10*3/uL (ref 0.0–0.7)
Lymphs Abs: 3.5 10*3/uL (ref 0.7–4.0)
Neutrophils Relative %: 50 % (ref 43–77)

## 2010-10-01 LAB — POCT PREGNANCY, URINE: Preg Test, Ur: NEGATIVE

## 2010-10-02 LAB — URINE CULTURE

## 2010-10-02 LAB — HIV ANTIBODY (ROUTINE TESTING W REFLEX): HIV: NONREACTIVE

## 2010-10-05 LAB — STOOL CULTURE

## 2010-10-05 NOTE — Discharge Summary (Signed)
Erika Porter, TOCCO                ACCOUNT NO.:  [id number]   MEDICAL RECORD NO.:  [id number]          PATIENT TYPE:  INP   LOCATION:  9142                          FACILITY:  [facility]   PHYSICIAN:  Conni Elliot, M.D.DATE OF BIRTH:  [birth year]   DATE OF ADMISSION:  01/03/2004  DATE OF DISCHARGE:  01/05/2004                                 DISCHARGE SUMMARY   DISCHARGE DIAGNOSES:  1.  Vaginal delivery of a female.  2.  Postpartum anemia, hemoglobin 9.6.   HOSPITAL COURSE:  Erika Porter is a 25 year old female G2 P1-0-0-1 who presented  at 40 weeks 1 day with contractions.  The patient was eventually admitted in  active labor and routine labs were drawn.  The patient was known GBS  negative.  She delivered at 1840 on January 03, 2004 after three pushes, a  female infant.  There was a nuchal cord x2 that was reduced.  The infant had  DeLee suction on the perineum and given to the NICU team because of thick  meconium fluid.  Apgars were 8 at one minute and 9 at five minutes.  The  placenta was delivered intact at 1850 with a three-vessel cord.  She did  have a small first degree laceration at the 11 o'clock position above the  urethra that did not need repair.  Her estimated blood loss was 300-500 mL.  Her uterus was firm with massage.  Mother and baby were doing well post  delivery and initially the patient wanted to try breastfeeding.  Postpartum  hemoglobin was 9.6 and she decided to use the Depo shot for birth control,  and was given this injection before discharge.  In addition, recommended to  social work that the patient be recommended for the [employer].  I  think this would be extremely helpful for her because she is only 25 years  old.   DISCHARGE MEDICATIONS:  1.  Ibuprofen 600 mg one p.o. q.6h. p.r.n. for pain.  2.  Continue her prenatal vitamins if the patient decides to continue      breastfeeding.   DIET WOUND CARE AND ACTIVITY:  Routine.   FOLLOW-UP CARE:  She is to  call the [facility] to make an  appointment to be seen in 6 weeks postpartum.   Discharge is to home.  The patient was ambulatory and was able to be  discharged with her infant.      CM/MEDQ  D:  02/09/2004  T:  02/10/2004  Job:  [id number]

## 2010-12-15 ENCOUNTER — Inpatient Hospital Stay (INDEPENDENT_AMBULATORY_CARE_PROVIDER_SITE_OTHER)
Admission: RE | Admit: 2010-12-15 | Discharge: 2010-12-15 | Disposition: A | Discharge status: Home / Self Care | Payer: Medicaid Other | Source: Ambulatory Visit | Attending: Family Medicine | Admitting: Family Medicine

## 2010-12-15 DIAGNOSIS — N946 Dysmenorrhea, unspecified: Secondary | ICD-10-CM

## 2010-12-15 DIAGNOSIS — N949 Unspecified condition associated with female genital organs and menstrual cycle: Secondary | ICD-10-CM

## 2010-12-15 LAB — POCT I-STAT, CHEM 8
BUN: 8 mg/dL (ref 6–23)
Creatinine, Ser: 0.7 mg/dL (ref 0.50–1.10)
Hemoglobin: 12.6 g/dL (ref 12.0–15.0)
Potassium: 4.2 mEq/L (ref 3.5–5.1)
Sodium: 140 mEq/L (ref 135–145)
TCO2: 23 mmol/L (ref 0–100)

## 2010-12-15 LAB — POCT URINALYSIS DIP (DEVICE)
Bilirubin Urine: NEGATIVE
Glucose, UA: NEGATIVE mg/dL
Ketones, ur: NEGATIVE mg/dL
Specific Gravity, Urine: 1.025 (ref 1.005–1.030)

## 2010-12-15 LAB — WET PREP, GENITAL
Clue Cells Wet Prep HPF POC: NONE SEEN
Trich, Wet Prep: NONE SEEN
Yeast Wet Prep HPF POC: NONE SEEN

## 2010-12-15 LAB — POCT PREGNANCY, URINE: Preg Test, Ur: NEGATIVE

## 2010-12-17 LAB — GC/CHLAMYDIA PROBE AMP, GENITAL
Chlamydia, DNA Probe: NEGATIVE
GC Probe Amp, Genital: NEGATIVE

## 2011-02-18 LAB — WET PREP, GENITAL: Clue Cells Wet Prep HPF POC: NONE SEEN

## 2011-02-18 LAB — GC/CHLAMYDIA PROBE AMP, GENITAL: Chlamydia, DNA Probe: NEGATIVE

## 2011-02-21 ENCOUNTER — Emergency Department (HOSPITAL_COMMUNITY)
Admission: EM | Admit: 2011-02-21 | Discharge: 2011-02-21 | Disposition: A | Discharge status: Home / Self Care | Payer: Medicaid Other | Attending: Emergency Medicine | Admitting: Emergency Medicine

## 2011-02-21 DIAGNOSIS — N898 Other specified noninflammatory disorders of vagina: Secondary | ICD-10-CM | POA: Insufficient documentation

## 2011-02-21 LAB — CBC
MCV: 77.3 fL — ABNORMAL LOW (ref 78.0–100.0)
Platelets: 224 10*3/uL (ref 150–400)
RBC: 4.4 MIL/uL (ref 3.87–5.11)
RDW: 13.5 % (ref 11.5–15.5)
WBC: 8.6 10*3/uL (ref 4.0–10.5)

## 2011-02-21 LAB — WET PREP, GENITAL
Trich, Wet Prep: NONE SEEN
WBC, Wet Prep HPF POC: NONE SEEN
Yeast Wet Prep HPF POC: NONE SEEN

## 2011-02-21 LAB — POCT I-STAT, CHEM 8
Creatinine, Ser: 0.9 mg/dL (ref 0.50–1.10)
Hemoglobin: 12.6 g/dL (ref 12.0–15.0)
Potassium: 3.8 mEq/L (ref 3.5–5.1)
Sodium: 140 mEq/L (ref 135–145)
TCO2: 19 mmol/L (ref 0–100)

## 2011-02-21 LAB — POCT PREGNANCY, URINE: Preg Test, Ur: NEGATIVE

## 2011-02-22 LAB — URINE MICROSCOPIC-ADD ON

## 2011-02-22 LAB — URINALYSIS, ROUTINE W REFLEX MICROSCOPIC
Bilirubin Urine: NEGATIVE
Glucose, UA: NEGATIVE mg/dL
Hgb urine dipstick: NEGATIVE
Ketones, ur: NEGATIVE mg/dL
Specific Gravity, Urine: 1.024 (ref 1.005–1.030)
pH: 8 (ref 5.0–8.0)

## 2011-02-22 LAB — POCT I-STAT, CHEM 8
BUN: 3 mg/dL — ABNORMAL LOW (ref 6–23)
Calcium, Ion: 1.06 mmol/L — ABNORMAL LOW (ref 1.12–1.32)
Chloride: 107 mEq/L (ref 96–112)
Glucose, Bld: 82 mg/dL (ref 70–99)

## 2011-03-15 ENCOUNTER — Encounter: Payer: Self-pay | Admitting: Family Medicine

## 2011-03-15 ENCOUNTER — Ambulatory Visit (INDEPENDENT_AMBULATORY_CARE_PROVIDER_SITE_OTHER): Payer: Medicaid Other | Admitting: Family Medicine

## 2011-03-15 VITALS — BP 104/67 | HR 73 | Temp 98.0°F | Ht 66.0 in | Wt 137.0 lb

## 2011-03-15 DIAGNOSIS — Z20828 Contact with and (suspected) exposure to other viral communicable diseases: Secondary | ICD-10-CM

## 2011-03-15 DIAGNOSIS — A599 Trichomoniasis, unspecified: Secondary | ICD-10-CM | POA: Insufficient documentation

## 2011-03-15 DIAGNOSIS — Z113 Encounter for screening for infections with a predominantly sexual mode of transmission: Secondary | ICD-10-CM

## 2011-03-15 DIAGNOSIS — Z23 Encounter for immunization: Secondary | ICD-10-CM

## 2011-03-15 DIAGNOSIS — Z309 Encounter for contraceptive management, unspecified: Secondary | ICD-10-CM | POA: Insufficient documentation

## 2011-03-15 LAB — POCT URINE PREGNANCY: Preg Test, Ur: NEGATIVE

## 2011-03-15 MED ORDER — MEDROXYPROGESTERONE ACETATE 150 MG/ML IM SUSP
150.0000 mg | Freq: Once | INTRAMUSCULAR | Status: AC
  Administered 2011-03-15: 150 mg via INTRAMUSCULAR

## 2011-03-15 NOTE — Assessment & Plan Note (Signed)
Patient is wanting to start Depo-Provera today. She has used this in the past and does not have any questions. Her last pregnancy test was 3 weeks ago. Upreg in our office today was negative, and she was given Depo shot. Pt will return in 3 months for next shot.

## 2011-03-15 NOTE — Assessment & Plan Note (Addendum)
Patient is concerned for STD. She has had one partner for the last 4 months. She was screened on 02/21/11 at urgent care, all of which were negative. She is not having any symptoms today and nothing that is concerning for her. We discussed the results from her test a few weeks ago. The only tests that were not done were RPR and HIV. I have offered these to her today, and she is interested in getting these. Will do these tests, but will not do a pelvic exam. If she begins having discharge or pain, she will let us know. I will call her if her RPR or HIV are abnormal.

## 2011-03-15 NOTE — Progress Notes (Signed)
  Subjective:    Patient ID: Erika Porter, female    DOB: 1986/02/03, 25 y.o.   MRN: [id number]  HPI  Pt is here for an std check today. Pt states she has only had one partner since July. She does not have any discharge, odor or abnormal vaginal bleeding. She does have some mild discomfort in her lower abdomen that has been there for >1 year, that has been evaluated by urgent care. She states her LMP was probably the first week of October, but she is unsure when. She is also requesting Depo today. Her last shot was reported to be one year ago. She states she is not pregnant. No N/V, diarrhea, constipation.   Of note, patient was seen on 02/21/11 at urgent care for the same complaint. At that time, she had a pelvic exam as well as GC, chlamydia, wet prep and upreg, all of which were negative. She was tested 6 months ago, 8 months ago and one year ago as well. She has a history of trich reported one year ago.  Review of Systems  Constitutional: Negative for fever and chills.  Respiratory: Negative for shortness of breath.   Cardiovascular: Negative for chest pain.  Gastrointestinal: Positive for abdominal pain. Negative for nausea, vomiting, diarrhea, constipation and blood in stool.  Genitourinary: Negative for urgency, vaginal bleeding, vaginal discharge, difficulty urinating and genital sores.  Skin: Negative for rash.  All other systems reviewed and are negative.        Objective:   Physical Exam  Constitutional: She is oriented to person, place, and time. She appears well-developed and well-nourished. No distress.  HENT:  Head: Normocephalic and atraumatic.  Cardiovascular: Normal rate, regular rhythm and normal heart sounds.   Pulmonary/Chest: Effort normal and breath sounds normal.  Abdominal: Soft. She exhibits no distension and no mass. There is no tenderness.  Musculoskeletal: She exhibits no edema.  Neurological: She is alert and oriented to person, place, and time.           Assessment & Plan:

## 2011-03-15 NOTE — Patient Instructions (Signed)
It was nice to meet you today.  I will call you with the results of your tests if they are abnormal. If you do not hear from me, then assume they are negative.  If you have any additional concerns or anything you want to talk to me about, please come back to the office.   Please remember to get your next Depo shot in 3 months.  Take care! Matricia Begnaud M. Aaidyn San, M.D.

## 2011-03-16 LAB — RPR

## 2011-06-11 ENCOUNTER — Ambulatory Visit (INDEPENDENT_AMBULATORY_CARE_PROVIDER_SITE_OTHER): Payer: Self-pay | Admitting: Family Medicine

## 2011-06-11 DIAGNOSIS — Z309 Encounter for contraceptive management, unspecified: Secondary | ICD-10-CM

## 2011-06-11 DIAGNOSIS — N760 Acute vaginitis: Secondary | ICD-10-CM

## 2011-06-11 DIAGNOSIS — W57XXXA Bitten or stung by nonvenomous insect and other nonvenomous arthropods, initial encounter: Secondary | ICD-10-CM | POA: Insufficient documentation

## 2011-06-11 DIAGNOSIS — Z113 Encounter for screening for infections with a predominantly sexual mode of transmission: Secondary | ICD-10-CM

## 2011-06-11 LAB — POCT WET PREP (WET MOUNT): Yeast Wet Prep HPF POC: NEGATIVE

## 2011-06-11 MED ORDER — MEDROXYPROGESTERONE ACETATE 150 MG/ML IM SUSP
150.0000 mg | Freq: Once | INTRAMUSCULAR | Status: AC
  Administered 2011-06-11: 150 mg via INTRAMUSCULAR

## 2011-06-11 MED ORDER — HYDROXYZINE HCL 25 MG PO TABS: 25.0000 mg | ORAL_TABLET | Freq: Three times a day (TID) | ORAL | Status: AC | PRN

## 2011-06-11 NOTE — Progress Notes (Signed)
Addended by: Garen Grams F on: 06/11/2011 04:43 PM   Modules accepted: Orders

## 2011-06-11 NOTE — Progress Notes (Signed)
  Subjective:    Patient ID: Erika Porter, female    DOB: 03-15-1986, 26 y.o.   MRN: [id number]  HPI Work in visit:  1. Requesting STD testing Concerned about boyfriend's unfaithfulness Also having vaginal irritation/itch for the past few days especially after intercourse Denies abnormal vaginal bleeding and dysuria (does have occasional "tingling" which is not an acute issue)  2. Rash  Very itchy spots over body Husband also has similar rash Not taking anything for it A neighbor's cats has fleas. She has been playing with its kittens recently. ROS: denies fever, feeling ill, nausea  Review of Systems Per HPI    Objective:   Physical Exam Gen: NAD GU: thick white discharge in posterior vault; vulva and cervix normal; no cervical motion tenderness; some tenderness with swab or vagina Skin: pinpoint, hyperpigmented, a few excoriated 1-2 mm papules in thigh, abdomen, and arms    Assessment & Plan:

## 2011-06-11 NOTE — Assessment & Plan Note (Addendum)
Patient requesting due to concern about partner's faithfulness.  Checking GC/Chlamydia and wet prep today. Patient recently tested for HIV and RPR (negative) and does not want to be tested for these today.  UPDATE: positive Trich and BV. Called and informed patient. Rx for metronidazole x 7 days. Will give Rx for partner (boyfriend) as well. Patient upset over the phone to get this diagnosis.

## 2011-06-11 NOTE — Patient Instructions (Signed)
I will call you with your lab results.   I think your bites are due to fleas.  Take the hydroxazine as needed for itch. Put antibiotic ointment over the spots to prevent infection. Avoid itching! Do not let that cat into your house and avoid contact with it.  Follow-up with your PCP as needed or for your next physical.

## 2011-06-11 NOTE — Assessment & Plan Note (Signed)
Rash consistent with flea bites especially given history of neighbor's cat with fleas and which she has been playing with.  Educated, told to avoid cat and kittens, antibiotic ointment and anti-histamine to prevent scratching and infection. Follow-up prn if rash not not improve with these measurements or gets worse or if she has systemic symptoms.

## 2011-06-12 ENCOUNTER — Encounter: Payer: Self-pay | Admitting: Family Medicine

## 2011-06-12 LAB — GC/CHLAMYDIA PROBE AMP, GENITAL: GC Probe Amp, Genital: NEGATIVE

## 2011-06-12 MED ORDER — METRONIDAZOLE 500 MG PO TABS: 500.0000 mg | ORAL_TABLET | Freq: Two times a day (BID) | ORAL | Status: DC

## 2011-06-12 NOTE — Progress Notes (Signed)
Addended by: Madolyn Frieze, Marylene Land J on: 06/12/2011 08:03 AM   Modules accepted: Orders

## 2011-07-03 ENCOUNTER — Ambulatory Visit (INDEPENDENT_AMBULATORY_CARE_PROVIDER_SITE_OTHER): Payer: Medicaid Other | Admitting: Family Medicine

## 2011-07-03 ENCOUNTER — Encounter: Payer: Self-pay | Admitting: Family Medicine

## 2011-07-03 VITALS — BP 119/68 | HR 70 | Ht 68.0 in | Wt 135.0 lb

## 2011-07-03 DIAGNOSIS — B86 Scabies: Secondary | ICD-10-CM

## 2011-07-03 MED ORDER — PERMETHRIN 5 % EX CREA: TOPICAL_CREAM | Freq: Once | CUTANEOUS | Status: AC

## 2011-07-03 NOTE — Patient Instructions (Signed)
Thank you for coming in today. Please apply the permethrin cream from the neck down overnight and wash it off in the morning. Use Benadryl (diphenhydramine) as needed for itching. Make sure to wash all of your clothing and sheets in hot water.  Scabies Scabies are small bugs (mites) that burrow under the skin and cause red bumps and severe itching. These bugs can only be seen with a microscope. Scabies are highly contagious. They can spread easily from person to person by direct contact. They are also spread through sharing clothing or linens that have the scabies mites living in them. It is not unusual for an entire family to become infected through shared towels, clothing, or bedding.   HOME CARE INSTRUCTIONS    Your caregiver may prescribe a cream or lotion to kill the mites. If this cream is prescribed; massage the cream into the entire area of the body from the neck to the bottom of both feet. Also massage the cream into the scalp and face if your child is less than 22 year old. Avoid the eyes and mouth.     Leave the cream on for 8 to12 hours. Do not wash your hands after application. Your child should bathe or shower after the 8 to 12 hour application period. Sometimes it is helpful to apply the cream to your child at right before bedtime.     One treatment is usually effective and will eliminate approximately 95% of infestations. For severe cases, your caregiver may decide to repeat the treatment in 1 week. Everyone in your household should be treated with one application of the cream.     New rashes or burrows should not appear after successful treatment within 24 to 48 hours; however the itching and rash may last for 2 to 4 weeks after successful treatment. If your symptoms persist longer than this, see your caregiver.     Your caregiver also may prescribe a medication to help with the itching or to help the rash go away more quickly.     Scabies can live on clothing or linens for up to 3  days. Your entire child's recently used clothing, towels, stuffed toys, and bed linens should be washed in hot water and then dried in a dryer for at least 20 minutes on high heat. Items that cannot be washed should be enclosed in a plastic bag for at least 3 days.     To help relieve itching, bathe your child in a cool bath or apply cool washcloths to the affected areas.     Your child may return to school after treatment with the prescribed cream.  SEEK MEDICAL CARE IF:    The itching persists longer than 4 weeks after treatment.     The rash spreads or becomes infected (the area has red blisters or yellow-tan crust).  Document Released: 05/06/2005 Document Revised: 01/16/2011 Document Reviewed: 09/14/2008 [facility] Patient Information 2012 [place], [place].

## 2011-07-03 NOTE — Progress Notes (Signed)
Erika Porter is a 26 y.o. female who presents to [facility] today for question scabies. Her boyfriend was recently treated for scabies in urgent care. She's noted itching for approximately one month and papules on her hands and arms. Denies any fevers chills trouble breathing feels well otherwise.   PMH reviewed. Significant for a history BV and trichomonas ROS as above otherwise neg Medications reviewed. Current Outpatient Prescriptions  Medication Sig Dispense Refill  . medroxyPROGESTERone (DEPO-PROVERA) 150 MG/ML injection Inject 150 mg into the muscle every 3 (three) months.        . metroNIDAZOLE (FLAGYL) 500 MG tablet Take 1 tablet (500 mg total) by mouth 2 (two) times daily. Take for 7 days for your infection.  14 tablet  0  . metroNIDAZOLE (FLAGYL) 500 MG tablet Take 1 tablet (500 mg total) by mouth 2 (two) times daily. For 7 days for your infection.  14 tablet  0  . permethrin (ACTICIN) 5 % cream Apply topically once.  60 g  0  . DISCONTD: metroNIDAZOLE (FLAGYL) 500 MG tablet Take 500 mg by mouth 2 (two) times daily. X 7 days         Exam:  BP 119/68  Pulse 70  Ht 5\' 8"  (1.727 m)  Wt 135 lb (61.236 kg)  BMI 20.53 kg/m2  LMP 06/04/2011 Gen: Well NAD SKIN: Multiple erythematous papules and interdigital web space and arms

## 2011-07-15 ENCOUNTER — Encounter (HOSPITAL_COMMUNITY): Payer: Self-pay

## 2011-07-15 ENCOUNTER — Emergency Department (INDEPENDENT_AMBULATORY_CARE_PROVIDER_SITE_OTHER)
Admission: EM | Admit: 2011-07-15 | Discharge: 2011-07-15 | Disposition: A | Discharge status: Home Health | Payer: Medicaid Other | Source: Home / Self Care | Attending: Emergency Medicine | Admitting: Emergency Medicine

## 2011-07-15 DIAGNOSIS — A499 Bacterial infection, unspecified: Secondary | ICD-10-CM

## 2011-07-15 DIAGNOSIS — N760 Acute vaginitis: Secondary | ICD-10-CM

## 2011-07-15 DIAGNOSIS — Z113 Encounter for screening for infections with a predominantly sexual mode of transmission: Secondary | ICD-10-CM

## 2011-07-15 DIAGNOSIS — B9689 Other specified bacterial agents as the cause of diseases classified elsewhere: Secondary | ICD-10-CM

## 2011-07-15 LAB — WET PREP, GENITAL

## 2011-07-15 MED ORDER — METRONIDAZOLE 500 MG PO TABS: 500.0000 mg | ORAL_TABLET | Freq: Two times a day (BID) | ORAL | Status: DC

## 2011-07-15 NOTE — Discharge Instructions (Signed)
Bacterial Vaginosis Bacterial vaginosis (BV) is a vaginal infection where the normal balance of bacteria in the vagina is disrupted. The normal balance is then replaced by an overgrowth of certain bacteria. There are several different kinds of bacteria that can cause BV. BV is the most common vaginal infection in women of childbearing age. CAUSES   The cause of BV is not fully understood. BV develops when there is an increase or imbalance of harmful bacteria.   Some activities or behaviors can upset the normal balance of bacteria in the vagina and put women at increased risk including:   Having a new sex partner or multiple sex partners.   Douching.   Using an intrauterine device (IUD) for contraception.   It is not clear what role sexual activity plays in the development of BV. However, women that have never had sexual intercourse are rarely infected with BV.  Women do not get BV from toilet seats, bedding, swimming pools or from touching objects around them.  SYMPTOMS   Grey vaginal discharge.   A fish-like odor with discharge, especially after sexual intercourse.   Itching or burning of the vagina and vulva.   Burning or pain with urination.   Some women have no signs or symptoms at all.  DIAGNOSIS  Your caregiver must examine the vagina for signs of BV. Your caregiver will perform lab tests and look at the sample of vaginal fluid through a microscope. They will look for bacteria and abnormal cells (clue cells), a pH test higher than 4.5, and a positive amine test all associated with BV.  RISKS AND COMPLICATIONS   Pelvic inflammatory disease (PID).   Infections following gynecology surgery.   Developing HIV.   Developing herpes virus.  TREATMENT  Sometimes BV will clear up without treatment. However, all women with symptoms of BV should be treated to avoid complications, especially if gynecology surgery is planned. Female partners generally do not need to be treated. However,  BV may spread between female sex partners so treatment is helpful in preventing a recurrence of BV.   BV may be treated with antibiotics. The antibiotics come in either pill or vaginal cream forms. Either can be used with nonpregnant or pregnant women, but the recommended dosages differ. These antibiotics are not harmful to the baby.   BV can recur after treatment. If this happens, a second round of antibiotics will often be prescribed.   Treatment is important for pregnant women. If not treated, BV can cause a premature delivery, especially for a pregnant woman who had a premature birth in the past. All pregnant women who have symptoms of BV should be checked and treated.   For chronic reoccurrence of BV, treatment with a type of prescribed gel vaginally twice a week is helpful.  HOME CARE INSTRUCTIONS   Finish all medication as directed by your caregiver.   Do not have sex until treatment is completed.   Tell your sexual partner that you have a vaginal infection. They should see their caregiver and be treated if they have problems, such as a mild rash or itching.   Practice safe sex. Use condoms. Only have 1 sex partner.  PREVENTION  Basic prevention steps can help reduce the risk of upsetting the natural balance of bacteria in the vagina and developing BV:  Do not have sexual intercourse (be abstinent).   Do not douche.   Use all of the medicine prescribed for treatment of BV, even if the signs and symptoms go away.     Tell your sex partner if you have BV. That way, they can be treated, if needed, to prevent reoccurrence.  SEEK MEDICAL CARE IF:   Your symptoms are not improving after 3 days of treatment.   You have increased discharge, pain, or fever.  MAKE SURE YOU:   Understand these instructions.   Will watch your condition.   Will get help right away if you are not doing well or get worse.  FOR MORE INFORMATION  Division of STD Prevention (DSTDP), Centers for Disease  Control and Prevention: www.cdc.gov/std American Social Health Association (ASHA): www.ashastd.org  Document Released: 05/06/2005 Document Revised: 01/16/2011 Document Reviewed: 10/27/2008 [employer] Patient Information 2012 [employer]. 

## 2011-07-15 NOTE — ED Notes (Signed)
Pt states she was seen at [facility] 2 weeks ago and diagnosed with trich.  States unable to get rx filled.  Now has external vaginal swelling and itching.

## 2011-07-15 NOTE — ED Provider Notes (Signed)
History     CSN: [id number]  Arrival date & time 07/15/11  4098   None     Chief Complaint  Patient presents with  . Vaginitis    (Consider location/radiation/quality/duration/timing/severity/associated sxs/prior treatment) Patient is a 26 y.o. female presenting with vaginal itching. The history is provided by the patient. No language interpreter was used.  Vaginal Itching This is a new problem. The problem occurs constantly. She has tried a cold compress for the symptoms. The treatment provided no relief.   Pt complains of swelling to labia.  Pt was diagnosed with trich but did not fill rx.   History reviewed. No pertinent past medical history.  History reviewed. No pertinent past surgical history.  No family history on file.  History  Substance Use Topics  . Smoking status: Current Everyday Smoker  . Smokeless tobacco: Not on file  . Alcohol Use: No    OB History    Grav Para Term Preterm Abortions TAB SAB Ect Mult Living                  Review of Systems  Genitourinary: Positive for vaginal discharge and vaginal pain.  All other systems reviewed and are negative.    Allergies  Review of patient's allergies indicates no known allergies.  Home Medications   Current Outpatient Rx  Name Route Sig Dispense Refill  . MEDROXYPROGESTERONE ACETATE 150 MG/ML IM SUSP Intramuscular Inject 150 mg into the muscle every 3 (three) months.      Marland Kitchen METRONIDAZOLE 500 MG PO TABS Oral Take 1 tablet (500 mg total) by mouth 2 (two) times daily. Take for 7 days for your infection. 14 tablet 0  . METRONIDAZOLE 500 MG PO TABS Oral Take 1 tablet (500 mg total) by mouth 2 (two) times daily. For 7 days for your infection. 14 tablet 0    BP 111/69  Pulse 70  Temp(Src) 98 F (36.7 C) (Oral)  Resp 16  SpO2 100%  Physical Exam  Vitals reviewed. Constitutional: She appears well-developed and well-nourished.  HENT:  Head: Normocephalic and atraumatic.  Right Ear: External ear  normal.  Left Ear: External ear normal.  Nose: Nose normal.  Mouth/Throat: Oropharynx is clear and moist.  Eyes: Pupils are equal, round, and reactive to light.  Neck: Normal range of motion.  Cardiovascular: Normal rate.   Pulmonary/Chest: Effort normal.  Abdominal: Soft.  Genitourinary: Vaginal discharge found.  Musculoskeletal: Normal range of motion.  Neurological: She is alert.  Skin: Skin is warm.  Psychiatric: She has a normal mood and affect.    ED Course  Procedures (including critical care time)   Labs Reviewed  GC/CHLAMYDIA PROBE AMP, GENITAL  WET PREP, GENITAL   No results found.   No diagnosis found.    MDM  Pt given rx for flagyl,          Langston Masker, [place] 07/15/11 1112  [place], [place] 07/15/11 1121

## 2011-07-15 NOTE — ED Provider Notes (Signed)
Medical screening examination/treatment/procedure(s) were performed by non-physician practitioner and as supervising physician I was immediately available for consultation/collaboration.  Leslee Home, M.D.   Roque Lias, MD 07/15/11 1130

## 2011-07-16 ENCOUNTER — Telehealth (HOSPITAL_COMMUNITY): Payer: Self-pay | Admitting: *Deleted

## 2011-07-16 LAB — GC/CHLAMYDIA PROBE AMP, GENITAL
Chlamydia, DNA Probe: NEGATIVE
GC Probe Amp, Genital: NEGATIVE

## 2011-07-16 NOTE — ED Notes (Signed)
GC/Chlamydia neg., Wet prep: Mod. clue cells, many trich, many WBC's. Pt. adequately treated with Flagyl. I called pt. and verified x 2. Pt. given results and told she was adeq. treated for bacterial vaginosis and trich with the Flagyl. Pt. instructed to notify herr partner to be treated, no sex until you have finished your medication and your partner has been treated and to practice safe sex. Pt. voiced understanding. Erika Porter 07/16/2011

## 2011-08-19 ENCOUNTER — Emergency Department (HOSPITAL_COMMUNITY): Admission: EM | Admit: 2011-08-19 | Discharge: 2011-08-19 | Payer: Self-pay

## 2011-08-20 ENCOUNTER — Emergency Department (INDEPENDENT_AMBULATORY_CARE_PROVIDER_SITE_OTHER)
Admission: EM | Admit: 2011-08-20 | Discharge: 2011-08-20 | Disposition: A | Discharge status: Home / Self Care | Payer: Medicaid Other | Source: Home / Self Care | Attending: Emergency Medicine | Admitting: Emergency Medicine

## 2011-08-20 ENCOUNTER — Encounter (HOSPITAL_COMMUNITY): Payer: Self-pay | Admitting: Emergency Medicine

## 2011-08-20 DIAGNOSIS — K2970 Gastritis, unspecified, without bleeding: Secondary | ICD-10-CM

## 2011-08-20 DIAGNOSIS — K2990 Gastroduodenitis, unspecified, without bleeding: Secondary | ICD-10-CM

## 2011-08-20 DIAGNOSIS — A5901 Trichomonal vulvovaginitis: Secondary | ICD-10-CM

## 2011-08-20 LAB — POCT URINALYSIS DIP (DEVICE)
Ketones, ur: NEGATIVE mg/dL
Protein, ur: 30 mg/dL — AB
Specific Gravity, Urine: >=1.03 (ref 1.005–1.030)
pH: 6 (ref 5.0–8.0)

## 2011-08-20 MED ORDER — ONDANSETRON HCL 4 MG/2ML IJ SOLN
INTRAMUSCULAR | Status: AC
  Filled 2011-08-20: qty 2

## 2011-08-20 MED ORDER — ONDANSETRON HCL 4 MG/2ML IJ SOLN
4.0000 mg | Freq: Once | INTRAMUSCULAR | Status: AC
  Administered 2011-08-20: 4 mg via INTRAMUSCULAR

## 2011-08-20 MED ORDER — SUCRALFATE 1 GM/10ML PO SUSP: 1.0000 g | Freq: Four times a day (QID) | ORAL | Status: DC

## 2011-08-20 MED ORDER — ONDANSETRON 8 MG PO TBDP: 8.0000 mg | ORAL_TABLET | Freq: Three times a day (TID) | ORAL | Status: AC | PRN

## 2011-08-20 MED ORDER — METRONIDAZOLE 500 MG PO TABS: 500.0000 mg | ORAL_TABLET | Freq: Two times a day (BID) | ORAL | Status: DC

## 2011-08-20 MED ORDER — OMEPRAZOLE 20 MG PO CPDR: 20.0000 mg | DELAYED_RELEASE_CAPSULE | Freq: Every day | ORAL | Status: DC

## 2011-08-20 NOTE — ED Provider Notes (Signed)
Chief Complaint  Patient presents with  . Emesis    History of Present Illness:   Erika Porter is a 26 year old female who has had nausea and vomiting since last night. Her vomitus is somewhat brownish in color. She denies any bright red blood in the vomitus. She's also had dry heaves. She had generalized abdominal pain for the past month. She denies any localized abdominal pain and her appetite had been good up until last night. Last night she drank 5 drinks in total. She states she has drank this much before not having trouble with it. She denies diarrhea, fever, or chills. She denies any amenorrhea. She is sexually active and does use condoms for birth control although not all the time. She was here 2 weeks ago with some vaginal discharge and some pelvic pain. She was diagnosed as having trichomonas and given metronidazole 500 mg one twice a day for one week. She only took the pills for a couple days. She thinks someone else might take her pills. She did not complete the course. Right now she isn't having any vaginal discharge or itching, although she is requesting another course of metronidazole.  Review of Systems:  Other than noted above, the patient denies any of the following symptoms: Systemic:  No fevers, chills, sweats, weight loss or gain, fatigue, or tiredness. ENT:  No nasal congestion, rhinorrhea, or sore throat. Lungs:  No cough, wheezing, or shortness of breath. Cardiac:  No chest pain, syncope, or presyncope. GI:  No abdominal pain, nausea, vomiting, anorexia, diarrhea, constipation, blood in stool or vomitus. GU:  No dysuria, frequency, or urgency.  PMFSH:  Past medical history, family history, social history, meds, and allergies were reviewed.  Physical Exam:   Vital signs:  BP 128/72  Pulse 121  Temp(Src) 98.4 F (36.9 C) (Oral)  Resp 16  SpO2 97%  LMP 08/18/2011 General:  Alert and oriented.  In no distress.  Skin warm and dry.  Good skin turgor, brisk capillary refill. Time  she appears completely normal and is talking on her cell phone, laughing, and conversant with her boyfriend. Other times she has uncontrollable retching. ENT:  No scleral icterus, moist mucous membranes, no oral lesions, pharynx clear. Lungs:  Breath sounds clear and equal bilaterally.  No wheezes, rales, or rhonchi. Heart:  Rhythm regular, without extrasystoles.  No gallops or murmers. Abdomen:  Abdomen is soft, flat, and nondistended. There is no tenderness to palpation, guarding, or rebound. No organomegaly or mass. Bowel sounds are hyperactive. Skin: Clear, warm, and dry.  Good turgor.  Brisk capillary refill.  Labs:   Results for orders placed during the hospital encounter of 08/20/11  POCT URINALYSIS DIP (DEVICE)      Component Value Range   Glucose, UA NEGATIVE  NEGATIVE (mg/dL)   Bilirubin Urine NEGATIVE  NEGATIVE    Ketones, ur NEGATIVE  NEGATIVE (mg/dL)   Specific Gravity, Urine >=1.030  1.005 - 1.030    Hgb urine dipstick TRACE (*) NEGATIVE    pH 6.0  5.0 - 8.0    Protein, ur 30 (*) NEGATIVE (mg/dL)   Urobilinogen, UA 0.2  0.0 - 1.0 (mg/dL)   Nitrite NEGATIVE  NEGATIVE    Leukocytes, UA TRACE (*) NEGATIVE   POCT PREGNANCY, URINE      Component Value Range   Preg Test, Ur NEGATIVE  NEGATIVE      Course in Urgent Care Center:   She was given Zofran 4 mg IM, and although this helped somewhat, the retching  did not completely go away. I offered her referral to a hospital, but she feels she would be better off at home with medications.  Assessment:   Diagnoses that have been ruled out:  None  Diagnoses that are still under consideration:  None  Final diagnoses:  Gastritis - probably due to heavy alcohol intake.   Trichomonas vaginitis    Plan:   1.  The following meds were prescribed:   New Prescriptions   METRONIDAZOLE (FLAGYL) 500 MG TABLET    Take 1 tablet (500 mg total) by mouth 2 (two) times daily.   OMEPRAZOLE (PRILOSEC) 20 MG CAPSULE    Take 1 capsule (20 mg  total) by mouth daily.   ONDANSETRON (ZOFRAN ODT) 8 MG DISINTEGRATING TABLET    Take 1 tablet (8 mg total) by mouth every 8 (eight) hours as needed for nausea.   SUCRALFATE (CARAFATE) 1 GM/10ML SUSPENSION    Take 10 mLs (1 g total) by mouth 4 (four) times daily.   2.  The patient was instructed in symptomatic care and handouts were given. 3.  The patient was told to return if becoming worse in any way, if no better in 2 or 3 days, and given some red flag symptoms that would indicate earlier return. 4.  The patient was told to take only sips of clear liquids for the next 24 hours and then advance to a b.r.a.t. Diet. 5.  The patient was told not to drink any alcohol for at least a week, and after that only in moderation.      Reuben Likes, MD 08/20/11 1331

## 2011-08-20 NOTE — ED Notes (Signed)
MD at bedside. 

## 2011-08-20 NOTE — Discharge Instructions (Signed)

## 2011-08-20 NOTE — ED Notes (Signed)
Discharge pending urine results

## 2011-08-20 NOTE — ED Notes (Signed)
Patient is vomiting, minimal clear/blood streaked emesis.  Patient begging for water "so i can get this up".  Patient reports vomiting during the night.  Patient admits to going out and drinking many "blue mototrcycles", "they just kept coming, and coming".  Patient reports recently being treated for scabies and recently being treated for trich.  Patient came last night to see if trich was cleared, but lobby full and planned to return this am.

## 2011-08-20 NOTE — ED Notes (Signed)
Family at bedside. 

## 2011-08-28 ENCOUNTER — Ambulatory Visit (INDEPENDENT_AMBULATORY_CARE_PROVIDER_SITE_OTHER): Payer: Medicaid Other | Admitting: *Deleted

## 2011-08-28 DIAGNOSIS — Z309 Encounter for contraceptive management, unspecified: Secondary | ICD-10-CM

## 2011-08-28 MED ORDER — MEDROXYPROGESTERONE ACETATE 150 MG/ML IM SUSP
150.0000 mg | Freq: Once | INTRAMUSCULAR | Status: AC
  Administered 2011-08-28: 150 mg via INTRAMUSCULAR

## 2011-09-10 ENCOUNTER — Emergency Department (INDEPENDENT_AMBULATORY_CARE_PROVIDER_SITE_OTHER)
Admission: EM | Admit: 2011-09-10 | Discharge: 2011-09-10 | Disposition: A | Discharge status: Home / Self Care | Payer: Medicaid Other | Source: Home / Self Care | Attending: Emergency Medicine | Admitting: Emergency Medicine

## 2011-09-10 ENCOUNTER — Encounter (HOSPITAL_COMMUNITY): Payer: Self-pay | Admitting: *Deleted

## 2011-09-10 DIAGNOSIS — Z01419 Encounter for gynecological examination (general) (routine) without abnormal findings: Secondary | ICD-10-CM

## 2011-09-10 HISTORY — DX: Trichomoniasis, unspecified: A59.9

## 2011-09-10 HISTORY — DX: Other specified bacterial agents as the cause of diseases classified elsewhere: B96.89

## 2011-09-10 HISTORY — DX: Chlamydial infection, unspecified: A74.9

## 2011-09-10 HISTORY — DX: Other specified bacterial agents as the cause of diseases classified elsewhere: N76.0

## 2011-09-10 LAB — WET PREP, GENITAL
Clue Cells Wet Prep HPF POC: NONE SEEN
Trich, Wet Prep: NONE SEEN
Yeast Wet Prep HPF POC: NONE SEEN

## 2011-09-10 NOTE — ED Notes (Signed)
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

## 2011-09-10 NOTE — ED Notes (Signed)
Pt  Reports  She  Was  Seen     Recently  For  Trich    She  Reports  She  Was  Treated  But  Wants  To  Be  Checked      -  She  denys  Any  Symptoms

## 2011-09-10 NOTE — ED Provider Notes (Signed)
History     CSN: [id number]  Arrival date & time 09/10/11  1638   First MD Initiated Contact with Patient 09/10/11 1706      Chief Complaint  Patient presents with  . Follow-up    (Consider location/radiation/quality/duration/timing/severity/associated sxs/prior treatment) HPI Comments: Patient is requesting a pelvic exam to make sure that she doesn't have Trichomonas. Patient is reporting intermittent, lower, nonradiating, crampy pain that last several minutes and then resolves. States that she had this pain when she was diagnosed with trichomonas 5 weeks ago. Pain is getting better, but has not completely resolved. Patient did not finish the Flagyl with the first diagnosis. She presented here 3 weeks ago requesting refills of Flagyl, and was sent home with another course. Patient states that she has been taking this intermittently, as she keeps "forgetting to take it" on a regular basis. Patient is currently on menses. Patient reports no vaginal discharge blisters, itching, and he urinary urgency, frequency, dysuria. No back pain. No nausea, vomiting, fevers. Patient's reports having unprotected sexual intercourse with her female partner who gave her the trichomonas after she was evaluated here 3 weeks ago. Denies any other sexual contact. Patient has a history of trichomoniasis, BV, Chlamydia. No history of gonorrhea, herpes, HIV, syphilis.  ROS as noted in HPI. All other ROS negative.   The history is provided by the patient. No language interpreter was used.    Past Medical History  Diagnosis Date  . Chlamydia   . Trichomonas   . BV (bacterial vaginosis)     History reviewed. No pertinent past surgical history.  History reviewed. No pertinent family history.  History  Substance Use Topics  . Smoking status: Current Everyday Smoker  . Smokeless tobacco: Not on file  . Alcohol Use: Yes    OB History    Grav Para Term Preterm Abortions TAB SAB Ect Mult Living                  Review of Systems  Allergies  Review of patient's allergies indicates no known allergies.  Home Medications   Current Outpatient Rx  Name Route Sig Dispense Refill  . MEDROXYPROGESTERONE ACETATE 150 MG/ML IM SUSP Intramuscular Inject 150 mg into the muscle every 3 (three) months.      Marland Kitchen METRONIDAZOLE 500 MG PO TABS Oral Take 1 tablet (500 mg total) by mouth 2 (two) times daily. 14 tablet 0  . OMEPRAZOLE 20 MG PO CPDR Oral Take 1 capsule (20 mg total) by mouth daily. 15 capsule 0  . SUCRALFATE 1 GM/10ML PO SUSP Oral Take 10 mLs (1 g total) by mouth 4 (four) times daily. 420 mL 0    BP 122/83  Pulse 83  Temp(Src) 98.2 F (36.8 C) (Oral)  Resp 16  SpO2 99%  LMP 09/10/2011  Physical Exam  Nursing note and vitals reviewed. Constitutional: She is oriented to person, place, and time. She appears well-developed and well-nourished. No distress.  HENT:  Head: Normocephalic and atraumatic.  Eyes: Conjunctivae and EOM are normal.  Neck: Normal range of motion.  Cardiovascular: Normal rate.   Pulmonary/Chest: Effort normal.  Abdominal: She exhibits no distension.  Genitourinary: Vagina normal and uterus normal. Pelvic exam was performed with patient prone. There is no rash on the right labia. There is no rash on the left labia. Right adnexum displays no mass. Left adnexum displays no mass. No erythema or tenderness around the vagina.       Scant brownish vaginal discharge consistent with  menstrual bleeding. No odor. Vaginal walls, cervical os normal. No adnexal tenderness. No CMT. Chaperone present during exam.  Musculoskeletal: Normal range of motion.  Neurological: She is alert and oriented to person, place, and time.  Skin: Skin is warm and dry.  Psychiatric: She has a normal mood and affect. Her behavior is normal. Judgment and thought content normal.    ED Course  Procedures (including critical care time)   Labs Reviewed  GC/CHLAMYDIA PROBE AMP, GENITAL  WET PREP, GENITAL    No results found.   1. Normal vaginal exam       MDM  Previous labs , records reviewed. Patient was seen 3 weeks ago on 4/2 for nausea and vomiting. Was complaining of generalized abdominal pain for a month. She was requesting another course of Flagyl. Was diagnosed with trichomoniasis 5 weeks ago, started on Flagyl, which she took for only a couple of days. She did not complete the course. She was sent home with another course of Flagyl, Prilosec, Zofran, and Carafate. Patient states that the nausea has improved, and is no longer taking the Prilosec and Carafate.  Will have patient finish the current prescription of Flagyl. as patient is currently asymptomatic, will not prescribed her another course of Flagyl until we know her lab results. Advised patient that she will need to come in for treatment if her gonorrhea comes back positive. Patient will give [place] a working phone number. She is to refrain from sexual intercourse until she knows her lab results, and her partner/partners are treated. Patient agrees with plan.   Luiz Blare, MD 09/10/11 Mikle Bosworth

## 2011-09-10 NOTE — Discharge Instructions (Signed)
Finish the Flagyl. Give [place] a working phone number so that we can contact you if needed. We will call in another prescription for Flagyl or an appropriate antibiotic, based on your lab results. You will have to come in and get treated if you're labs come back positive for gonorrhea. Your exam was normal today, so you may have cleared the trichomonas infection. Refrain from sexual contact until you know your results and your partner(s) are treated. Return if you get worse, have a fever >100.4, or for any concerns.   Go to www.goodrx.com to look up your medications. This will give you a list of where you can find your prescriptions at the most affordable prices.

## 2011-09-11 NOTE — ED Notes (Signed)
Wet prep - WBCs, many.  Pt adequately treated with flagyl.

## 2011-11-18 ENCOUNTER — Encounter: Payer: Self-pay | Admitting: Family Medicine

## 2011-11-18 ENCOUNTER — Other Ambulatory Visit (HOSPITAL_COMMUNITY)
Admission: RE | Admit: 2011-11-18 | Discharge: 2011-11-18 | Disposition: A | Discharge status: Home / Self Care | Payer: Medicaid Other | Source: Ambulatory Visit | Attending: Family Medicine | Admitting: Family Medicine

## 2011-11-18 ENCOUNTER — Ambulatory Visit (INDEPENDENT_AMBULATORY_CARE_PROVIDER_SITE_OTHER): Payer: Medicaid Other | Admitting: Family Medicine

## 2011-11-18 VITALS — BP 110/68 | HR 60 | Temp 98.2°F | Ht 66.0 in | Wt 131.0 lb

## 2011-11-18 DIAGNOSIS — Z113 Encounter for screening for infections with a predominantly sexual mode of transmission: Secondary | ICD-10-CM | POA: Insufficient documentation

## 2011-11-18 DIAGNOSIS — J309 Allergic rhinitis, unspecified: Secondary | ICD-10-CM

## 2011-11-18 DIAGNOSIS — Z7251 High risk heterosexual behavior: Secondary | ICD-10-CM

## 2011-11-18 DIAGNOSIS — Z309 Encounter for contraceptive management, unspecified: Secondary | ICD-10-CM

## 2011-11-18 HISTORY — DX: Allergic rhinitis, unspecified: J30.9

## 2011-11-18 LAB — POCT WET PREP (WET MOUNT): Clue Cells Wet Prep Whiff POC: POSITIVE

## 2011-11-18 MED ORDER — MOMETASONE FUROATE 50 MCG/ACT NA SUSP: 2.0000 | Freq: Every day | NASAL | Status: DC

## 2011-11-18 MED ORDER — MEDROXYPROGESTERONE ACETATE 150 MG/ML IM SUSP
150.0000 mg | Freq: Once | INTRAMUSCULAR | Status: AC
  Administered 2011-11-18: 150 mg via INTRAMUSCULAR

## 2011-11-18 NOTE — Progress Notes (Signed)
  Subjective:    Patient ID: CONTESSA PREUSS, female    DOB: [birth year], 26 y.o.   MRN: [id number]  HPI Patient comes in today for her 3 month Depo-Provera shot. She like to be checked for STDs vaginally only. She reports one partner who she is broken up with. They've recently gone back together and she is unsure her if he has been with anyone else. She desires treatment for her fetal allergies are to been acting up. She has not been on antihistamine and has previously had success with nasal spray.  Review of Systems  Constitutional: Negative for fever and chills.  HENT: Positive for congestion, rhinorrhea and sneezing.   Respiratory: Negative for shortness of breath.   Cardiovascular: Negative for chest pain.  Gastrointestinal: Negative for nausea and vomiting.  Genitourinary: Positive for vaginal bleeding (with Depo-Provera).  Musculoskeletal: Negative for back pain.       Objective:   Physical Exam  Vitals reviewed. Constitutional: She appears well-developed and well-nourished.  HENT:  Head: Normocephalic and atraumatic.  Neck: Normal range of motion.  Cardiovascular: Normal rate.   Pulmonary/Chest: Effort normal.  Abdominal: Soft. There is no tenderness.  Genitourinary: Uterus normal. Vaginal discharge (bloody) found.  Musculoskeletal: She exhibits no edema.       Assessment & Plan:

## 2011-11-18 NOTE — Assessment & Plan Note (Signed)
Check wet prep, GC, chlamydia today.

## 2011-11-18 NOTE — Assessment & Plan Note (Signed)
Continue Depo Provera 

## 2011-11-18 NOTE — Assessment & Plan Note (Signed)
Start Nasonex

## 2011-11-18 NOTE — Patient Instructions (Signed)
Allergic Rhinitis Allergic rhinitis is when the mucous membranes in the nose respond to allergens. Allergens are particles in the air that cause your body to have an allergic reaction. This causes you to release allergic antibodies. Through a chain of events, these eventually cause you to release histamine into the blood stream (hence the use of antihistamines). Although meant to be protective to the body, it is this release that causes your discomfort, such as frequent sneezing, congestion and an itchy runny nose.  CAUSES  The pollen allergens may come from grasses, trees, and weeds. This is seasonal allergic rhinitis, or "hay fever." Other allergens cause year-round allergic rhinitis (perennial allergic rhinitis) such as house dust mite allergen, pet dander and mold spores.  SYMPTOMS   Nasal stuffiness (congestion).   Runny, itchy nose with sneezing and tearing of the eyes.   There is often an itching of the mouth, eyes and ears.  It cannot be cured, but it can be controlled with medications. DIAGNOSIS  If you are unable to determine the offending allergen, skin or blood testing may find it. TREATMENT   Avoid the allergen.   Medications and allergy shots (immunotherapy) can help.   Hay fever may often be treated with antihistamines in pill or nasal spray forms. Antihistamines block the effects of histamine. There are over-the-counter medicines that may help with nasal congestion and swelling around the eyes. Check with your caregiver before taking or giving this medicine.  If the treatment above does not work, there are many new medications your caregiver can prescribe. Stronger medications may be used if initial measures are ineffective. Desensitizing injections can be used if medications and avoidance fails. Desensitization is when a patient is given ongoing shots until the body becomes less sensitive to the allergen. Make sure you follow up with your caregiver if problems continue. SEEK  MEDICAL CARE IF:   You develop fever (more than 100.5 F (38.1 C).   You develop a cough that does not stop easily (persistent).   You have shortness of breath.   You start wheezing.   Symptoms interfere with normal daily activities.  Document Released: 01/29/2001 Document Revised: 04/25/2011 Document Reviewed: 08/10/2008 [facility] Patient Information 2012 [place], [place].Safer Sex Your caregiver wants you to have this information about the infections that can be transmitted from sexual contact and how to prevent them. The idea behind safer sex is that you can be sexually active, and at the same time reduce the risk of giving or getting a sexually transmitted disease (STD). Every person should be aware of how to prevent him or herself and his or her sex partner from getting an STD. CAUSES OF STDS STDs are transmitted by sharing body fluids, which contain viruses and bacteria. The following fluids all transmit infections during sexual intercourse and sex acts:  Semen.   Saliva.   Urine.   Blood.   Vaginal mucus.  Examples of STDs include:  Chlamydia.   Gonorrhea.   Genital herpes.   Hepatitis B.   Human immunodeficiency virus or acquired immunodeficiency syndrome (HIV or AIDS).   Syphilis.   Trichomonas.   Pubic lice.   Human papillomavirus (HPV), which may include:   Genital warts.   Cervical dysplasia.   Cervical cancer (can develop with certain types of HPV).  SYMPTOMS  Sexual diseases often cause few or no symptoms until they are advanced, so a person can be infected and spread the infection without knowing it. Some STDs respond to treatment very well. Others, like  HIV and herpes, cannot be cured, but are treated to reduce their effects. Specific symptoms include:  Abnormal vaginal discharge.   Irritation or itching in and around the vagina, and in the pubic hair.   Pain during sexual intercourse.   Bleeding during sexual intercourse.   Pelvic or  abdominal pain.   Fever.   Growths in and around the vagina.   An ulcer in or around the vagina.   Swollen glands in the groin area.  DIAGNOSIS   Blood tests.   Pap test.   Culture test of abnormal vaginal discharge.   A test that applies a solution and examines the cervix with a lighted magnifying scope (colposcopy).   A test that examines the pelvis with a lighted tube, through a small incision (laparoscopy).  TREATMENT  The treatment will depend on the cause of the STD.  Antibiotic treatment by injection, oral, creams, or suppositories in the vagina.   Over-the-counter medicated shampoo, to get rid of pubic lice.   Removing or treating growths with medicine, freezing, burning (electrocautery), or surgery.   Surgery treatment for HPV of the cervix.   Supportive medicines for herpes, HIV, AIDS, and hepatitis.  Being careful cannot eliminate all risk of infection, but sex can be made much safer. Safe sexual practices include body massage and gentle touching. Masturbation is safe, as long as body fluids do not contact skin that has sores or cuts. Dry kissing and oral sex on a man wearing a latex condom or on a woman wearing a female condom is also safe. Slightly less safe is intercourse while the man wears a latex condom or wet kissing. It is also safer to have one sex partner that you know is not having sex with anyone else. LENGTH OF ILLNESS An STD might be treated and cured in a week, sometimes a month, or more. And it can linger with symptoms for many years. STDs can also cause damage to the female organs. This can cause chronic pain, infertility, and recurrence of the STD, especially herpes, hepatitis, HIV, and HPV. HOME CARE INSTRUCTIONS AND PREVENTION  Alcohol and recreational drugs are often the reason given for not practicing safer sex. These substances affect your judgment. Alcohol and recreational drugs can also impair your immune system, making you more vulnerable to  disease.   Do not engage in risky and dangerous sexual practices, including:   Vaginal or anal sex without a condom.   Oral sex on a man without a condom.   Oral sex on a woman without a female condom.   Using saliva to lubricate a condom.   Any other sexual contact in which body fluids or blood from one partner contact the other partner.   You should use only latex condoms for men and water soluble lubricants. Petroleum based lubricants or oils used to lubricate a condom will weaken the condom and increase the chance that it will break.   Think very carefully before having sex with anyone who is high risk for STDs and HIV. This includes IV drug users, people with multiple sexual partners, or people who have had an STD, or a positive hepatitis or HIV blood test.   Remember that even if your partner has had only one previous partner, their previous partner might have had multiple partners. If so, you are at high risk of being exposed to an STD. You and your sex partner should be the only sex partners with each other, with no one else involved.  A vaccine is available for hepatitis B and HPV through your caregiver or the Public Health Department. Everyone should be vaccinated with these vaccines.   Avoid risky sex practices. Sex acts that can break the skin make you more likely to get an STD.  SEEK MEDICAL CARE IF:   If you think you have an STD, even if you do not have any symptoms. Contact your caregiver for evaluation and treatment, if needed.   You think or know your sex partner has acquired an STD.   You have any of the symptoms mentioned above.  Document Released: 06/13/2004 Document Revised: 04/25/2011 Document Reviewed: 04/05/2009 [facility] Patient Information 2012 [place], [place].

## 2011-11-19 ENCOUNTER — Telehealth: Payer: Self-pay | Admitting: *Deleted

## 2011-11-19 DIAGNOSIS — J309 Allergic rhinitis, unspecified: Secondary | ICD-10-CM

## 2011-11-19 NOTE — Telephone Encounter (Signed)
PA required for Nasonex.  Will send message to Dr. Shawnie Pons.   Form placed in MD box. Will fax to Dr. Shawnie Pons if she wants.

## 2011-11-20 ENCOUNTER — Encounter: Payer: Self-pay | Admitting: *Deleted

## 2011-11-20 MED ORDER — FLUTICASONE PROPIONATE 50 MCG/ACT NA SUSP: 2.0000 | Freq: Every day | NASAL | Status: DC

## 2011-11-27 ENCOUNTER — Ambulatory Visit (INDEPENDENT_AMBULATORY_CARE_PROVIDER_SITE_OTHER): Payer: Medicaid Other | Admitting: Family Medicine

## 2011-11-27 ENCOUNTER — Encounter: Payer: Self-pay | Admitting: Family Medicine

## 2011-11-27 VITALS — BP 122/85 | HR 80 | Temp 98.7°F | Ht 66.0 in | Wt 131.0 lb

## 2011-11-27 DIAGNOSIS — N760 Acute vaginitis: Secondary | ICD-10-CM

## 2011-11-27 DIAGNOSIS — B9689 Other specified bacterial agents as the cause of diseases classified elsewhere: Secondary | ICD-10-CM

## 2011-11-27 DIAGNOSIS — W448XXA Other foreign body entering into or through a natural orifice, initial encounter: Secondary | ICD-10-CM | POA: Insufficient documentation

## 2011-11-27 DIAGNOSIS — T192XXA Foreign body in vulva and vagina, initial encounter: Secondary | ICD-10-CM

## 2011-11-27 DIAGNOSIS — A499 Bacterial infection, unspecified: Secondary | ICD-10-CM

## 2011-11-27 MED ORDER — METRONIDAZOLE 0.75 % VA GEL: 1.0000 | Freq: Two times a day (BID) | VAGINAL | Status: AC

## 2011-11-27 NOTE — Assessment & Plan Note (Signed)
Wet prep from last visit shows BV. Patient has had this in the past. Will give Metrogel daily x5 days. Patient agrees with plan.

## 2011-11-27 NOTE — Patient Instructions (Addendum)
Everything looks completely normal today. I do not have an explanation, but I can tell you that your exam was normal.  At your last visit, you had a bacterial infection. I have sent in a prescription for a gel to use for the next 5 days.  If you develop strong abdominal pain, increased discharge or have any other concerns, please come back to see me.  Take care! Jamala Kohen M. Quintin Hjort, M.D.

## 2011-11-27 NOTE — Progress Notes (Signed)
Subjective:     Patient ID: Erika Porter, female   DOB: 1985-09-16, 26 y.o.   MRN: [id number]  HPI  Patient comes to office today for concern of retained tampon. She states she inserted tampon 3 days ago to go swimming. She realized the following day that she had no removed the tampon. She states she was spotting from her Depo shot, and has continued to bleed since inserting tampon. She has also had intercourse with no problems. She has tried to feel for the string with no success. She denies pain, discharge, discomfort of any type or irritation. She has not had any fevers or rashes. Overall, she is well.  Patient is also requesting the results from her last visit. At that visit she had her depo shot as well as testing including a wet prep.   History reviewed: Current everyday smoker  Review of Systems See HPI above    Objective:   Physical Exam  Constitutional: She appears well-developed and well-nourished. No distress.  Cardiovascular: Normal rate and regular rhythm.   Pulmonary/Chest: Effort normal. She has no wheezes.  Abdominal: Soft. There is no tenderness.  Genitourinary: Vagina normal and uterus normal. No vaginal discharge found.       No foreign body identified on speculum exam. No tampon or string felt on bimanual exam. Scant bloody mucus noted at cervical os.   Musculoskeletal: She exhibits no edema.  Skin: Skin is warm and dry. No rash noted.   Assessment:     26 yo F with r/o retained tampon    Plan:

## 2011-11-27 NOTE — Assessment & Plan Note (Signed)
No tampon seen on speculum exam or bimanual. More than likely tampon either was removed or fell out. Patient given signs/sx of TSS. If she develops pain, fever, rash or fatigue, she will return for evaluation. Reassurance given.

## 2011-12-24 ENCOUNTER — Other Ambulatory Visit: Payer: Self-pay | Admitting: *Deleted

## 2011-12-24 MED ORDER — METRONIDAZOLE 0.75 % VA GEL: 1.0000 | Freq: Every day | VAGINAL | Status: AC

## 2012-02-10 ENCOUNTER — Ambulatory Visit (INDEPENDENT_AMBULATORY_CARE_PROVIDER_SITE_OTHER): Payer: Medicaid Other | Admitting: *Deleted

## 2012-02-10 ENCOUNTER — Emergency Department (HOSPITAL_COMMUNITY)
Admission: EM | Admit: 2012-02-10 | Discharge: 2012-02-10 | Discharge status: Absent without leave | Payer: Medicaid Other | Source: Home / Self Care

## 2012-02-10 DIAGNOSIS — Z309 Encounter for contraceptive management, unspecified: Secondary | ICD-10-CM

## 2012-02-10 MED ORDER — MEDROXYPROGESTERONE ACETATE 150 MG/ML IM SUSP
150.0000 mg | Freq: Once | INTRAMUSCULAR | Status: AC
  Administered 2012-02-10: 150 mg via INTRAMUSCULAR

## 2012-02-10 NOTE — Progress Notes (Signed)
Patient first went to Urgent Care  And they called over for approval. She was advised to come to our office. Patient states she wants to be checked for STD and get Depo. Explained that we cannot make appointment this late but can make appointment tomorrow. Depo was  given today

## 2012-02-11 ENCOUNTER — Encounter: Payer: Self-pay | Admitting: Family Medicine

## 2012-02-11 ENCOUNTER — Other Ambulatory Visit (HOSPITAL_COMMUNITY)
Admission: RE | Admit: 2012-02-11 | Discharge: 2012-02-11 | Disposition: A | Discharge status: Home / Self Care | Payer: Medicaid Other | Source: Ambulatory Visit | Attending: Family Medicine | Admitting: Family Medicine

## 2012-02-11 ENCOUNTER — Ambulatory Visit (INDEPENDENT_AMBULATORY_CARE_PROVIDER_SITE_OTHER): Payer: Medicaid Other | Admitting: Family Medicine

## 2012-02-11 VITALS — BP 123/56 | HR 85 | Temp 98.2°F | Ht 66.0 in | Wt 130.5 lb

## 2012-02-11 DIAGNOSIS — A499 Bacterial infection, unspecified: Secondary | ICD-10-CM

## 2012-02-11 DIAGNOSIS — B9689 Other specified bacterial agents as the cause of diseases classified elsewhere: Secondary | ICD-10-CM

## 2012-02-11 DIAGNOSIS — N760 Acute vaginitis: Secondary | ICD-10-CM

## 2012-02-11 DIAGNOSIS — Z113 Encounter for screening for infections with a predominantly sexual mode of transmission: Secondary | ICD-10-CM | POA: Insufficient documentation

## 2012-02-11 LAB — POCT WET PREP (WET MOUNT): Clue Cells Wet Prep Whiff POC: POSITIVE

## 2012-02-11 MED ORDER — METRONIDAZOLE 500 MG PO TABS: 500.0000 mg | ORAL_TABLET | Freq: Two times a day (BID) | ORAL | Status: DC

## 2012-02-11 NOTE — Assessment & Plan Note (Signed)
Bacterial vaginosis and wet prep. Metronidazole 500 mg twice a day x 7 days.

## 2012-02-11 NOTE — Progress Notes (Signed)
  Subjective:    Patient ID: Erika Porter, female    DOB: [birth year], 26 y.o.   MRN: [id number]  HPI  Two weeks of heavy discharge. Yellowish. 1 sexual partner. Unprotected intercourse. Previous history of trich, gonorrhea, and yeast infection. Denies dysuria and hematuria. Denies fever, nausea, and vomiting.  Had depo shot yesterday.    Review of Systems Negative unless stated in HPI.     Objective:   Physical Exam BP 123/56  Pulse 85  Temp 98.2 F (36.8 C) (Oral)  Ht 5\' 6"  (1.676 m)  Wt 130 lb 8 oz (59.194 kg)  BMI 21.06 kg/m2 Gen: well appearing young [place] American female Cardiovascular: regular rate and rhythm, no murmurs Pulmonary: clear auscultation bilaterally Genitourinary: no inguinal lymphadenopathy, no lesions or sores on external genitalia, internal genitalia noteworthy for yellow discharge from the cervical os  Wet prep: positive whiff, numerous clue cells, no yeast or trichomonas     Assessment & Plan:  26 year old female with bacterial vaginosis.

## 2012-02-12 ENCOUNTER — Telehealth: Payer: Self-pay | Admitting: Family Medicine

## 2012-02-12 NOTE — Telephone Encounter (Signed)
Patient informed that gonorrhea and chlamydia tests both negative.

## 2012-04-29 ENCOUNTER — Ambulatory Visit: Payer: Medicaid Other | Admitting: Family Medicine

## 2012-04-30 ENCOUNTER — Other Ambulatory Visit (HOSPITAL_COMMUNITY)
Admission: RE | Admit: 2012-04-30 | Discharge: 2012-04-30 | Disposition: A | Discharge status: Home / Self Care | Payer: Medicaid Other | Source: Ambulatory Visit | Attending: Family Medicine | Admitting: Family Medicine

## 2012-04-30 ENCOUNTER — Ambulatory Visit (INDEPENDENT_AMBULATORY_CARE_PROVIDER_SITE_OTHER): Payer: Medicaid Other | Admitting: Family Medicine

## 2012-04-30 ENCOUNTER — Ambulatory Visit: Payer: Medicaid Other | Admitting: Family Medicine

## 2012-04-30 ENCOUNTER — Encounter: Payer: Self-pay | Admitting: Family Medicine

## 2012-04-30 VITALS — BP 116/62 | HR 89 | Temp 97.6°F | Ht 67.0 in | Wt 127.0 lb

## 2012-04-30 DIAGNOSIS — Z113 Encounter for screening for infections with a predominantly sexual mode of transmission: Secondary | ICD-10-CM | POA: Insufficient documentation

## 2012-04-30 DIAGNOSIS — Z7251 High risk heterosexual behavior: Secondary | ICD-10-CM

## 2012-04-30 DIAGNOSIS — Z2089 Contact with and (suspected) exposure to other communicable diseases: Secondary | ICD-10-CM

## 2012-04-30 DIAGNOSIS — Z202 Contact with and (suspected) exposure to infections with a predominantly sexual mode of transmission: Secondary | ICD-10-CM

## 2012-04-30 DIAGNOSIS — Z20828 Contact with and (suspected) exposure to other viral communicable diseases: Secondary | ICD-10-CM

## 2012-04-30 DIAGNOSIS — Z8639 Personal history of other endocrine, nutritional and metabolic disease: Secondary | ICD-10-CM

## 2012-04-30 DIAGNOSIS — N760 Acute vaginitis: Secondary | ICD-10-CM

## 2012-04-30 LAB — POCT WET PREP (WET MOUNT)
Clue Cells Wet Prep Whiff POC: POSITIVE
WBC, Wet Prep HPF POC: >20

## 2012-04-30 MED ORDER — METRONIDAZOLE 500 MG PO TABS: 500.0000 mg | ORAL_TABLET | Freq: Two times a day (BID) | ORAL | Status: DC

## 2012-04-30 NOTE — Assessment & Plan Note (Signed)
Repeat TSH today

## 2012-04-30 NOTE — Assessment & Plan Note (Signed)
Wet prep, GC/Chlamydia today.  Also RPR and HIV at patient request.   Wet prep positive for BV, will treat with Flagyl.   Will call patient with results of other testing.

## 2012-04-30 NOTE — Progress Notes (Signed)
  Subjective:    Patient ID: Erika Porter, female    DOB: 06/15/1985, 26 y.o.   MRN: [id number]  HPI  1.  Concern for STD:  Patient in long-term relationship with same man for about 10 years.  Broke up in August, he had another sexual partner at that time.  Had unprotected sexual intercourse two weeks ago.  Denies any symptoms (no vaginal discharge, bleeding, itching, burning, dysuria, abdominal pain, nausea or vomiting) but does want to be checked for STD's since he had another sexual partner.  No fevers or chills.    2.  History of hyperthyroidism:  Mentions this in passing.  Was tested for hyperthyroidism at [facility] about 1 year ago and provided unknown medication.  Never had follow up testing.  Weight stable, but she is unable to gain weight.  Does have occasional night sweats.  No palpitations or shortness of breath.    Review of Systems See HPI above for review of systems.       Objective:   Physical Exam Gen:  Alert, cooperative patient who appears stated age in no acute distress.  Vital signs reviewed. HEENT:  [place]/AT.  No exophthalmus noted. MMM Neck:  No thyromegaly Heart: RRR Abd:  Soft/nondistended/nontender.  Good bowel sounds throughout all four quadrants.  No masses noted.  GYN:  External genitalia within normal limits.  Vaginal mucosa pink, moist, normal rugae.  Nonfriable cervix without lesions, some grayish discharge from cervical os without any bleeding noted on speculum exam.  Bimanual exam revealed normal, nongravid uterus.  No cervical motion tenderness. No adnexal masses bilaterally.          Assessment & Plan:

## 2012-05-01 LAB — HIV ANTIBODY (ROUTINE TESTING W REFLEX): HIV: NONREACTIVE

## 2012-06-03 ENCOUNTER — Ambulatory Visit (INDEPENDENT_AMBULATORY_CARE_PROVIDER_SITE_OTHER): Payer: Medicaid Other | Admitting: Family Medicine

## 2012-06-03 VITALS — BP 115/74 | HR 71 | Temp 98.8°F | Ht 66.0 in | Wt 125.0 lb

## 2012-06-03 DIAGNOSIS — Z309 Encounter for contraceptive management, unspecified: Secondary | ICD-10-CM

## 2012-06-03 DIAGNOSIS — R358 Other polyuria: Secondary | ICD-10-CM

## 2012-06-03 DIAGNOSIS — N390 Urinary tract infection, site not specified: Secondary | ICD-10-CM | POA: Insufficient documentation

## 2012-06-03 DIAGNOSIS — N910 Primary amenorrhea: Secondary | ICD-10-CM

## 2012-06-03 DIAGNOSIS — N912 Amenorrhea, unspecified: Secondary | ICD-10-CM

## 2012-06-03 LAB — POCT URINALYSIS DIPSTICK
Glucose, UA: NEGATIVE
Spec Grav, UA: >=1.03
Urobilinogen, UA: 0.2

## 2012-06-03 LAB — POCT UA - MICROSCOPIC ONLY

## 2012-06-03 MED ORDER — CEPHALEXIN 500 MG PO CAPS: 500.0000 mg | ORAL_CAPSULE | Freq: Two times a day (BID) | ORAL | Status: DC

## 2012-06-03 MED ORDER — MEDROXYPROGESTERONE ACETATE 150 MG/ML IM SUSP
150.0000 mg | Freq: Once | INTRAMUSCULAR | Status: AC
  Administered 2012-06-03: 150 mg via INTRAMUSCULAR

## 2012-06-03 NOTE — Progress Notes (Signed)
  Subjective:    Patient ID: Erika Porter, female    DOB: Nov 30, 1985, 27 y.o.   MRN: [id number]  HPI  Erika Porter comes in for frequent urination that has been going on for about one week.  She says it started in the middle of the night last week, and she had to go many times, but then during the daytime she has had to go about every hour.  She does not have pain with urination. She says it is only a little bit of urine except for when she drinks water.  She denies extreme thirst, abdominal pain, she has had some nausea though.  She is not up to date on her depo shot, she is having unprotected intercourse.  However, she denies vaginal discharge, pain, or itching.   Review of Systems See HPI    Objective:   Physical Exam BP 115/74  Pulse 71  Temp 98.8 F (37.1 C) (Oral)  Ht 5\' 6"  (1.676 m)  Wt 125 lb (56.7 kg)  BMI 20.18 kg/m2 General appearance: alert, cooperative and no distress Abdomen: soft, non-tender; bowel sounds normal; no masses,  no organomegaly      Assessment & Plan:

## 2012-06-03 NOTE — Patient Instructions (Signed)
Urinary Tract Infection  A urinary tract infection (UTI) is often caused by a germ (bacteria). A UTI is usually helped with medicine (antibiotics) that kills germs. Take all the medicine until it is gone. Do this even if you are feeling better. You are usually better in 7 to 10 days.  HOME CARE    Drink enough water and fluids to keep your pee (urine) clear or pale yellow. Drink:   Cranberry juice.   Water.   Avoid:   Caffeine.   Tea.   Bubbly (carbonated) drinks.   Alcohol.   Only take medicine as told by your doctor.   To prevent further infections:   Pee often.   After pooping (bowel movement), women should wipe from front to back. Use each tissue only once.   Pee before and after having sex (intercourse).  Ask your doctor when your test results will be ready. Make sure you follow up and get your test results.   GET HELP RIGHT AWAY IF:    There is very bad back pain or lower belly (abdominal) pain.   You get the chills.   You have a fever.   Your baby is older than 3 months with a rectal temperature of 102 F (38.9 C) or higher.   Your baby is 3 months old or younger with a rectal temperature of 100.4 F (38 C) or higher.   You feel sick to your stomach (nauseous) or throw up (vomit).   There is continued burning with peeing.   Your problems are not better in 3 days. Return sooner if you are getting worse.  MAKE SURE YOU:    Understand these instructions.   Will watch your condition.   Will get help right away if you are not doing well or get worse.  Document Released: 10/23/2007 Document Revised: 07/29/2011 Document Reviewed: 10/23/2007  [employer] Patient Information 2013 [employer]

## 2012-06-03 NOTE — Addendum Note (Signed)
Addended by: Jone Baseman D on: 06/03/2012 09:54 AM   Modules accepted: Orders

## 2012-06-03 NOTE — Assessment & Plan Note (Signed)
U preg neg, UA consistent with UTI, will send for culture.  Rx Keflex 500 PO BID x 7 days.

## 2012-06-03 NOTE — Assessment & Plan Note (Signed)
Depo given today. 

## 2012-06-05 LAB — URINE CULTURE: Colony Count: >=100000

## 2012-06-17 ENCOUNTER — Other Ambulatory Visit: Payer: Medicaid Other

## 2012-07-30 ENCOUNTER — Emergency Department (HOSPITAL_COMMUNITY): Payer: Medicaid Other

## 2012-07-30 ENCOUNTER — Encounter (HOSPITAL_COMMUNITY): Payer: Self-pay | Admitting: Emergency Medicine

## 2012-07-30 ENCOUNTER — Emergency Department (HOSPITAL_COMMUNITY)
Admission: EM | Admit: 2012-07-30 | Discharge: 2012-07-30 | Disposition: A | Discharge status: Home / Self Care | Payer: Medicaid Other | Attending: Emergency Medicine | Admitting: Emergency Medicine

## 2012-07-30 DIAGNOSIS — M549 Dorsalgia, unspecified: Secondary | ICD-10-CM | POA: Insufficient documentation

## 2012-07-30 DIAGNOSIS — F17200 Nicotine dependence, unspecified, uncomplicated: Secondary | ICD-10-CM | POA: Insufficient documentation

## 2012-07-30 DIAGNOSIS — R109 Unspecified abdominal pain: Secondary | ICD-10-CM | POA: Insufficient documentation

## 2012-07-30 DIAGNOSIS — Z8742 Personal history of other diseases of the female genital tract: Secondary | ICD-10-CM | POA: Insufficient documentation

## 2012-07-30 DIAGNOSIS — N949 Unspecified condition associated with female genital organs and menstrual cycle: Secondary | ICD-10-CM | POA: Insufficient documentation

## 2012-07-30 DIAGNOSIS — N898 Other specified noninflammatory disorders of vagina: Secondary | ICD-10-CM | POA: Insufficient documentation

## 2012-07-30 DIAGNOSIS — Z202 Contact with and (suspected) exposure to infections with a predominantly sexual mode of transmission: Secondary | ICD-10-CM | POA: Insufficient documentation

## 2012-07-30 DIAGNOSIS — Z3202 Encounter for pregnancy test, result negative: Secondary | ICD-10-CM | POA: Insufficient documentation

## 2012-07-30 DIAGNOSIS — R42 Dizziness and giddiness: Secondary | ICD-10-CM | POA: Insufficient documentation

## 2012-07-30 DIAGNOSIS — Z8619 Personal history of other infectious and parasitic diseases: Secondary | ICD-10-CM | POA: Insufficient documentation

## 2012-07-30 DIAGNOSIS — R110 Nausea: Secondary | ICD-10-CM | POA: Insufficient documentation

## 2012-07-30 DIAGNOSIS — R51 Headache: Secondary | ICD-10-CM | POA: Insufficient documentation

## 2012-07-30 LAB — BASIC METABOLIC PANEL
CO2: 26 mEq/L (ref 19–32)
Calcium: 9.3 mg/dL (ref 8.4–10.5)
Creatinine, Ser: 0.88 mg/dL (ref 0.50–1.10)
Glucose, Bld: 82 mg/dL (ref 70–99)

## 2012-07-30 LAB — CBC WITH DIFFERENTIAL/PLATELET
Basophils Absolute: 0 10*3/uL (ref 0.0–0.1)
Eosinophils Relative: 3 % (ref 0–5)
HCT: 32.2 % — ABNORMAL LOW (ref 36.0–46.0)
Lymphocytes Relative: 20 % (ref 12–46)
MCV: 77.4 fL — ABNORMAL LOW (ref 78.0–100.0)
Monocytes Absolute: 0.7 10*3/uL (ref 0.1–1.0)
RDW: 14 % (ref 11.5–15.5)
WBC: 10.7 10*3/uL — ABNORMAL HIGH (ref 4.0–10.5)

## 2012-07-30 LAB — POCT I-STAT, CHEM 8
Calcium, Ion: 1.24 mmol/L — ABNORMAL HIGH (ref 1.12–1.23)
Creatinine, Ser: 0.8 mg/dL (ref 0.50–1.10)
Glucose, Bld: 82 mg/dL (ref 70–99)
Hemoglobin: 11.9 g/dL — ABNORMAL LOW (ref 12.0–15.0)
Sodium: 141 mEq/L (ref 135–145)
TCO2: 26 mmol/L (ref 0–100)

## 2012-07-30 LAB — URINALYSIS, ROUTINE W REFLEX MICROSCOPIC
Glucose, UA: NEGATIVE mg/dL
Ketones, ur: 15 mg/dL — AB
Protein, ur: 30 mg/dL — AB

## 2012-07-30 MED ORDER — SODIUM CHLORIDE 0.9 % IV BOLUS (SEPSIS)
250.0000 mL | Freq: Once | INTRAVENOUS | Status: AC
  Administered 2012-07-30: 250 mL via INTRAVENOUS

## 2012-07-30 MED ORDER — LIDOCAINE HCL (PF) 1 % IJ SOLN
INTRAMUSCULAR | Status: AC
  Filled 2012-07-30: qty 5

## 2012-07-30 MED ORDER — AZITHROMYCIN 1 G PO PACK
1.0000 g | PACK | Freq: Once | ORAL | Status: AC
  Administered 2012-07-30: 1 g via ORAL
  Filled 2012-07-30: qty 1

## 2012-07-30 MED ORDER — SODIUM CHLORIDE 0.9 % IV SOLN
INTRAVENOUS | Status: DC
  Administered 2012-07-30: 12:00:00 via INTRAVENOUS

## 2012-07-30 MED ORDER — CEFTRIAXONE SODIUM 250 MG IJ SOLR
250.0000 mg | Freq: Once | INTRAMUSCULAR | Status: AC
  Administered 2012-07-30: 250 mg via INTRAMUSCULAR
  Filled 2012-07-30: qty 250

## 2012-07-30 MED ORDER — IOHEXOL 300 MG/ML  SOLN
25.0000 mL | INTRAMUSCULAR | Status: AC
  Administered 2012-07-30: 25 mL via ORAL

## 2012-07-30 MED ORDER — IOHEXOL 300 MG/ML  SOLN
80.0000 mL | Freq: Once | INTRAMUSCULAR | Status: AC | PRN
  Administered 2012-07-30: 80 mL via INTRAVENOUS

## 2012-07-30 MED ORDER — TRAMADOL HCL 50 MG PO TABS: 50.0000 mg | ORAL_TABLET | Freq: Four times a day (QID) | ORAL | Status: DC | PRN

## 2012-07-30 MED ORDER — HYDROMORPHONE HCL PF 1 MG/ML IJ SOLN
1.0000 mg | Freq: Once | INTRAMUSCULAR | Status: AC
  Administered 2012-07-30: 1 mg via INTRAVENOUS
  Filled 2012-07-30: qty 1

## 2012-07-30 MED ORDER — ONDANSETRON HCL 4 MG/2ML IJ SOLN
4.0000 mg | Freq: Once | INTRAMUSCULAR | Status: AC
  Administered 2012-07-30: 4 mg via INTRAVENOUS
  Filled 2012-07-30: qty 2

## 2012-07-30 NOTE — ED Notes (Signed)
Pt reports vaginal pain and bleeding after having intercourse yesterday. States recently has been having stomach pains causing her to feel light headed and dizzy. Denies any fever or vomiting.

## 2012-07-30 NOTE — ED Provider Notes (Addendum)
History     CSN: [id number]  Arrival date & time 07/30/12  1005   First MD Initiated Contact with Patient 07/30/12 1112      Chief Complaint  Patient presents with  . Abdominal Pain    (Consider location/radiation/quality/duration/timing/severity/associated sxs/prior treatment) Patient is a 27 y.o. female presenting with abdominal pain. The history is provided by the patient.  Abdominal Pain Associated symptoms: nausea and vaginal bleeding   Associated symptoms: no chest pain, no dysuria, no fever, no shortness of breath, no vaginal discharge and no vomiting    [facility]. Presents with a two-day history of bilateral lower quadrant abdominal pain that radiates to her back buttocks and vaginal area. The pain is listed as a 4-6/10. Patient's also had a headache for one month bilateral temple area associated with dizziness and lightheadedness. Patient admits to nausea but no vomiting or diarrhea. Has had vaginal bleeding currently and she is on Depakote. Recent intercourse, with possible STD exposure. The bleeding started after intercourse.   Past Medical History  Diagnosis Date  . Chlamydia   . Trichomonas   . BV (bacterial vaginosis)     History reviewed. No pertinent past surgical history.  History reviewed. No pertinent family history.  History  Substance Use Topics  . Smoking status: Current Every Day Smoker  . Smokeless tobacco: Not on file  . Alcohol Use: Yes    OB History   Grav Para Term Preterm Abortions TAB SAB Ect Mult Living                  Review of Systems  Constitutional: Negative for fever.  HENT: Negative for congestion.   Eyes: Negative for visual disturbance.  Respiratory: Negative for shortness of breath.   Cardiovascular: Negative for chest pain.  Gastrointestinal: Positive for nausea and abdominal pain. Negative for vomiting.  Genitourinary: Positive for vaginal bleeding and vaginal pain. Negative for  dysuria and vaginal discharge.  Musculoskeletal: Positive for back pain.  Skin: Negative for rash.  Neurological: Positive for dizziness, light-headedness and headaches. Negative for syncope, speech difficulty, weakness and numbness.    Allergies  Review of patient's allergies indicates no known allergies.  Home Medications   Current Outpatient Rx  Name  Route  Sig  Dispense  Refill  . medroxyPROGESTERone (DEPO-PROVERA) 150 MG/ML injection   Intramuscular   Inject 150 mg into the muscle every 3 (three) months.           . traMADol (ULTRAM) 50 MG tablet   Oral   Take 1 tablet (50 mg total) by mouth every 6 (six) hours as needed for pain.   15 tablet   0     BP 120/89  Pulse 67  Temp(Src) 98.9 F (37.2 C) (Oral)  Resp 16  SpO2 100%  LMP 07/30/2012  Physical Exam  Constitutional: She is oriented to person, place, and time. She appears well-developed and well-nourished. No distress.  HENT:  Head: Normocephalic and atraumatic.  Mouth/Throat: Oropharynx is clear and moist.  Eyes: Conjunctivae and EOM are normal. Pupils are equal, round, and reactive to light.  Neck: Normal range of motion.  Cardiovascular: Normal rate, regular rhythm and normal heart sounds.   No murmur heard. Pulmonary/Chest: Effort normal and breath sounds normal.  Abdominal: Soft. Bowel sounds are normal. There is tenderness.  Very mild tenderness lower quadrants.  Genitourinary: Vagina normal and uterus normal. No vaginal discharge found.  External genitalia is normal. Menstrual type blood in the  bulb. No clots. Cervix nontender to motion tenderness uterus nontender adnexa nontender. No purulent discharge. No evidence of any significant discharge at all clinically.  Musculoskeletal: Normal range of motion.  Neurological: She is alert and oriented to person, place, and time. No cranial nerve deficit. Coordination normal.  Skin: Skin is warm. No rash noted.    ED Course  Procedures (including  critical care time)  Labs Reviewed  URINALYSIS, ROUTINE W REFLEX MICROSCOPIC - Abnormal; Notable for the following:    Color, Urine AMBER (*)    APPearance TURBID (*)    Hgb urine dipstick LARGE (*)    Bilirubin Urine SMALL (*)    Ketones, ur 15 (*)    Protein, ur 30 (*)    Leukocytes, UA MODERATE (*)    All other components within normal limits  CBC WITH DIFFERENTIAL - Abnormal; Notable for the following:    WBC 10.7 (*)    Hemoglobin 11.0 (*)    HCT 32.2 (*)    MCV 77.4 (*)    All other components within normal limits  BASIC METABOLIC PANEL - Abnormal; Notable for the following:    GFR calc non Af Amer 90 (*)    All other components within normal limits  URINE MICROSCOPIC-ADD ON - Abnormal; Notable for the following:    Squamous Epithelial / LPF MANY (*)    Bacteria, UA FEW (*)    All other components within normal limits  POCT I-STAT, CHEM 8 - Abnormal; Notable for the following:    Calcium, Ion 1.24 (*)    Hemoglobin 11.9 (*)    HCT 35.0 (*)    All other components within normal limits  URINE CULTURE  GC/CHLAMYDIA PROBE AMP  POCT PREGNANCY, URINE   Dg Chest 2 View  07/30/2012  *RADIOLOGY REPORT*  Clinical Data: Smoker with cough.  Abdominal pain.  Headache.  CHEST - 2 VIEW  Comparison: Two-view chest x-ray 07/26/2008, 06/09/2004.  Findings: Cardiomediastinal silhouette unremarkable, unchanged. Lungs clear.  Bronchovascular markings normal.  Pulmonary vascularity normal.  No pneumothorax.  No pleural effusions. Slight thoracic scoliosis convex left, unchanged.  IMPRESSION: No acute cardiopulmonary disease.  Stable examination.   Original Report Authenticated By: Hulan Saas, M.D.    Ct Head Wo Contrast  07/30/2012  *RADIOLOGY REPORT*  Clinical Data: Frontal headache, dizziness, lightheaded  CT HEAD WITHOUT CONTRAST  Technique:  Contiguous axial images were obtained from the base of the skull through the vertex without contrast.  Comparison: None.  Findings: No acute  intracranial hemorrhage, mass lesion, definite acute infarction, focal edema, or mass effect.  No hydrocephalus, midline shift, herniation, or extra-axial fluid collection.  Gray- white matter differentiation maintained.  Cisterns patent.  No definite cerebellar abnormality.  Symmetric orbits.  Mastoids clear.  Minor left ethmoid and sphenoid mucosal thickening.  Other sinuses clear.  IMPRESSION: No acute intracranial finding.   Original Report Authenticated By: Judie Petit. Miles Costain, M.D.    Ct Abdomen Pelvis W Contrast  07/30/2012  *RADIOLOGY REPORT*  Clinical Data: Abdominal pain.  Vaginal bleeding  CT ABDOMEN AND PELVIS WITH CONTRAST  Technique:  Multidetector CT imaging of the abdomen and pelvis was performed following the standard protocol during bolus administration of intravenous contrast.  Contrast: 80mL OMNIPAQUE IOHEXOL 300 MG/ML  SOLN  Comparison: None.  Findings: Lung bases are clear.  No effusions.  Heart is normal size.  Liver, gallbladder, spleen, pancreas, adrenals and kidneys are normal.  Small right ovarian cyst, 2.6 cm.  Uterus and left ovary as well  as a decompressed urinary bladder grossly unremarkable. Bowel grossly unremarkable.  No free fluid, free air, or adenopathy.  Appendix is visualized and is normal.  Stomach unremarkable.  No acute bony abnormality.  IMPRESSION: No acute findings in the abdomen or pelvis.  Small right ovarian cyst.   Original Report Authenticated By: Charlett Nose, M.D.    Results for orders placed during the hospital encounter of 07/30/12  URINALYSIS, ROUTINE W REFLEX MICROSCOPIC      Result Value Range   Color, Urine AMBER (*) YELLOW   APPearance TURBID (*) CLEAR   Specific Gravity, Urine 1.027  1.005 - 1.030   pH 6.0  5.0 - 8.0   Glucose, UA NEGATIVE  NEGATIVE mg/dL   Hgb urine dipstick LARGE (*) NEGATIVE   Bilirubin Urine SMALL (*) NEGATIVE   Ketones, ur 15 (*) NEGATIVE mg/dL   Protein, ur 30 (*) NEGATIVE mg/dL   Urobilinogen, UA 1.0  0.0 - 1.0 mg/dL   Nitrite  NEGATIVE  NEGATIVE   Leukocytes, UA MODERATE (*) NEGATIVE  CBC WITH DIFFERENTIAL      Result Value Range   WBC 10.7 (*) 4.0 - 10.5 K/uL   RBC 4.16  3.87 - 5.11 MIL/uL   Hemoglobin 11.0 (*) 12.0 - 15.0 g/dL   HCT 16.1 (*) 09.6 - 04.5 %   MCV 77.4 (*) 78.0 - 100.0 fL   MCH 26.4  26.0 - 34.0 pg   MCHC 34.2  30.0 - 36.0 g/dL   RDW 40.9  81.1 - 91.4 %   Platelets 207  150 - 400 K/uL   Neutrophils Relative 70  43 - 77 %   Neutro Abs 7.5  1.7 - 7.7 K/uL   Lymphocytes Relative 20  12 - 46 %   Lymphs Abs 2.2  0.7 - 4.0 K/uL   Monocytes Relative 6  3 - 12 %   Monocytes Absolute 0.7  0.1 - 1.0 K/uL   Eosinophils Relative 3  0 - 5 %   Eosinophils Absolute 0.3  0.0 - 0.7 K/uL   Basophils Relative 0  0 - 1 %   Basophils Absolute 0.0  0.0 - 0.1 K/uL  BASIC METABOLIC PANEL      Result Value Range   Sodium 137  135 - 145 mEq/L   Potassium 3.5  3.5 - 5.1 mEq/L   Chloride 103  96 - 112 mEq/L   CO2 26  19 - 32 mEq/L   Glucose, Bld 82  70 - 99 mg/dL   BUN 7  6 - 23 mg/dL   Creatinine, Ser 7.82  0.50 - 1.10 mg/dL   Calcium 9.3  8.4 - 95.6 mg/dL   GFR calc non Af Amer 90 (*) >90 mL/min   GFR calc Af Amer >90  >90 mL/min  URINE MICROSCOPIC-ADD ON      Result Value Range   Squamous Epithelial / LPF MANY (*) RARE   WBC, UA 21-50  <3 WBC/hpf   RBC / HPF TOO NUMEROUS TO COUNT  <3 RBC/hpf   Bacteria, UA FEW (*) RARE  POCT I-STAT, CHEM 8      Result Value Range   Sodium 141  135 - 145 mEq/L   Potassium 3.5  3.5 - 5.1 mEq/L   Chloride 106  96 - 112 mEq/L   BUN 6  6 - 23 mg/dL   Creatinine, Ser 2.13  0.50 - 1.10 mg/dL   Glucose, Bld 82  70 - 99 mg/dL   Calcium, Ion 0.86 (*)  1.12 - 1.23 mmol/L   TCO2 26  0 - 100 mmol/L   Hemoglobin 11.9 (*) 12.0 - 15.0 g/dL   HCT 11.9 (*) 14.7 - 82.9 %  POCT PREGNANCY, URINE      Result Value Range   Preg Test, Ur NEGATIVE  NEGATIVE     1. Abdominal pain   2. Headache   3. Dizzy   4. Exposure to STD       MDM  Patient followed by family practice  care. Patient presented with multiple complaints to include the bilateral lower quadrant abdominal pain it radiates to her back and vaginal area. Also with the complaint of headaches for a month associated with dizziness and lightheadedness. The abdominal pain is rated as a 5/10 described as an ache. Patient also had possible STD exposure. Patient currently with vaginal bleeding she is on Depo-Provera  patient's workup included head CT that was negative CT of the abdomen negative except for small ovarian cyst not sure that it's related to her symptoms but possibly. Also chest x-ray was negative and lab workup was negative   Patient will be treated for STD exposure at her request. Cultures are pending. Patient received Rocephin and the Zithromax in ED.     Shelda Jakes, MD 07/30/12 5621  Shelda Jakes, MD 07/30/12 [phone]

## 2012-08-01 LAB — URINE CULTURE
Colony Count: NO GROWTH
Culture: NO GROWTH

## 2012-08-02 ENCOUNTER — Telehealth (HOSPITAL_COMMUNITY): Payer: Self-pay | Admitting: Emergency Medicine

## 2012-08-02 NOTE — ED Notes (Signed)
+  Gonorrhea. Patient treated with Rocephin and Zithromax. Per protocol MD. [facility] faxed. °

## 2012-08-02 NOTE — ED Notes (Signed)
Patient has +Gonorrhea. Checking to see if treated appropriately.

## 2012-10-01 ENCOUNTER — Other Ambulatory Visit (HOSPITAL_COMMUNITY)
Admission: RE | Admit: 2012-10-01 | Discharge: 2012-10-01 | Disposition: A | Discharge status: Home / Self Care | Payer: Medicaid Other | Source: Ambulatory Visit | Attending: Family Medicine | Admitting: Family Medicine

## 2012-10-01 ENCOUNTER — Encounter: Payer: Self-pay | Admitting: Family Medicine

## 2012-10-01 ENCOUNTER — Ambulatory Visit (INDEPENDENT_AMBULATORY_CARE_PROVIDER_SITE_OTHER): Payer: Medicaid Other | Admitting: Family Medicine

## 2012-10-01 VITALS — BP 106/64 | HR 65 | Temp 97.9°F | Wt 136.6 lb

## 2012-10-01 DIAGNOSIS — L732 Hidradenitis suppurativa: Secondary | ICD-10-CM | POA: Insufficient documentation

## 2012-10-01 DIAGNOSIS — Z113 Encounter for screening for infections with a predominantly sexual mode of transmission: Secondary | ICD-10-CM | POA: Insufficient documentation

## 2012-10-01 DIAGNOSIS — N760 Acute vaginitis: Secondary | ICD-10-CM

## 2012-10-01 DIAGNOSIS — N912 Amenorrhea, unspecified: Secondary | ICD-10-CM

## 2012-10-01 MED ORDER — CLINDAMYCIN PHOSPHATE 1 % EX SOLN: Freq: Two times a day (BID) | CUTANEOUS | Status: DC

## 2012-10-01 NOTE — Assessment & Plan Note (Signed)
We discussed this diagnosis.  -Try clindamycin solution bid for 2-3 months -Given handout on what she can do at home, particularly avoiding tight fitting clothing and smoking cessation; she has not smoked past 2 days; she seems to smoke intermittently particularly more if drinking  -Follow-up in 1 month; may cancel appointment if improved; follow-up sooner if any signs of cellulitis

## 2012-10-01 NOTE — Patient Instructions (Addendum)
Use the clindamycin (antibiotic) solution twice a day for 2-3 months  Follow-up in 1 month with Dr. Mikel Cella  If any signs of infection, come back sooner  Hidradenitis Suppurativa, Sweat Gland Abscess TREATMENT   Topical germ killing medicine applied to the skin (antibiotics) are the treatment of choice. Antibiotics taken by mouth (systemic) are sometimes needed when the condition is getting worse or is severe.  Avoid tight-fitting clothing which traps moisture in.  Dirt does not cause hidradenitis and it is not caused by poor hygiene.  Involved areas should be cleaned daily using an antibacterial soap. Some patients find that the liquid form of Lever 2000, applied to the involved areas as a lotion after bathing, can help reduce the odor related to this condition.  Sometimes surgery is needed to drain infected areas or remove scarred tissue. Removal of large amounts of tissue is used only in severe cases.  Birth control pills may be helpful.  Oral retinoids (vitamin A derivatives) for 6 to 12 months which are effective for acne may also help this condition.  Weight loss will improve but not cure hidradenitis. It is made worse by being overweight. But the condition is not caused by being overweight.  This condition is more common in people who have had acne.  It may become worse under stress.  Stop smoking.

## 2012-10-01 NOTE — Addendum Note (Signed)
Addended by: Jimmy Footman K on: 10/01/2012 10:05 AM   Modules accepted: Orders

## 2012-10-01 NOTE — Progress Notes (Signed)
  Subjective:    Patient ID: Erika Porter, female    DOB: 1986-04-21, 27 y.o.   MRN: [id number]  HPI # SDA bump on her vagina She noticed about a week ago. She thought it was a hair bump. But it has grown larger since then. It then came to a head and she expressed pus yesterday.  Today, it is better.   She does not shave her vaginal area.   She denies history of herpes; she has had "hair bumps" in the past but it has never been this big; she gets them every few weeks  Last sexual intercourse: week ago.   Denies new soaps, detergents, douching   Review of Systems Per HPI Denies fevers, chills, rash, nausea/vomiting, abnormal vaginal discharge, irritation  LMP: she just had 1-2 weeks ago  Allergies, medication, past medical history reviewed.  Smoking status noted. Tobacco, substance abuse High-risk sexual behavior     Objective:   Physical Exam GEN: NAD; well-nourished, -appearing; thin; wears tight pants SKIN: expressed pustule right upper pubic area; pubic area not shaved; several hyperpigmented scars; no erythema, tenderness, discharge      Assessment & Plan:

## 2012-10-02 ENCOUNTER — Encounter: Payer: Self-pay | Admitting: Family Medicine

## 2012-11-02 ENCOUNTER — Ambulatory Visit: Payer: Medicaid Other | Admitting: Family Medicine

## 2012-12-10 ENCOUNTER — Telehealth: Payer: Self-pay | Admitting: Family Medicine

## 2012-12-10 ENCOUNTER — Encounter (HOSPITAL_COMMUNITY): Payer: Self-pay

## 2012-12-10 ENCOUNTER — Emergency Department (HOSPITAL_COMMUNITY)
Admission: EM | Admit: 2012-12-10 | Discharge: 2012-12-10 | Disposition: A | Discharge status: Home / Self Care | Payer: Medicaid Other | Attending: Emergency Medicine | Admitting: Emergency Medicine

## 2012-12-10 DIAGNOSIS — Z8619 Personal history of other infectious and parasitic diseases: Secondary | ICD-10-CM | POA: Insufficient documentation

## 2012-12-10 DIAGNOSIS — Z3490 Encounter for supervision of normal pregnancy, unspecified, unspecified trimester: Secondary | ICD-10-CM

## 2012-12-10 DIAGNOSIS — O210 Mild hyperemesis gravidarum: Secondary | ICD-10-CM | POA: Insufficient documentation

## 2012-12-10 DIAGNOSIS — Z8742 Personal history of other diseases of the female genital tract: Secondary | ICD-10-CM | POA: Insufficient documentation

## 2012-12-10 DIAGNOSIS — O99330 Smoking (tobacco) complicating pregnancy, unspecified trimester: Secondary | ICD-10-CM | POA: Insufficient documentation

## 2012-12-10 LAB — URINE MICROSCOPIC-ADD ON

## 2012-12-10 LAB — URINALYSIS, ROUTINE W REFLEX MICROSCOPIC
Hgb urine dipstick: NEGATIVE
Nitrite: NEGATIVE
Specific Gravity, Urine: 1.034 — ABNORMAL HIGH (ref 1.005–1.030)
pH: 6 (ref 5.0–8.0)

## 2012-12-10 LAB — WET PREP, GENITAL: Trich, Wet Prep: NONE SEEN

## 2012-12-10 LAB — POCT PREGNANCY, URINE: Preg Test, Ur: POSITIVE — AB

## 2012-12-10 MED ORDER — LIDOCAINE HCL (PF) 1 % IJ SOLN
INTRAMUSCULAR | Status: AC
  Filled 2012-12-10: qty 5

## 2012-12-10 MED ORDER — ONDANSETRON 4 MG PO TBDP
4.0000 mg | ORAL_TABLET | Freq: Once | ORAL | Status: AC
  Administered 2012-12-10: 4 mg via ORAL
  Filled 2012-12-10: qty 1

## 2012-12-10 MED ORDER — CEFTRIAXONE SODIUM 250 MG IJ SOLR
250.0000 mg | Freq: Once | INTRAMUSCULAR | Status: DC
  Filled 2012-12-10: qty 250

## 2012-12-10 MED ORDER — ONDANSETRON 8 MG PO TBDP: 8.0000 mg | ORAL_TABLET | Freq: Three times a day (TID) | ORAL | Status: DC | PRN

## 2012-12-10 MED ORDER — AZITHROMYCIN 250 MG PO TABS
1000.0000 mg | ORAL_TABLET | Freq: Once | ORAL | Status: DC
  Filled 2012-12-10: qty 4

## 2012-12-10 NOTE — ED Provider Notes (Signed)
Medical screening examination/treatment/procedure(s) were conducted as a shared visit with non-physician practitioner(s) and myself.  I personally evaluated the patient during the encounter  Patient found to be pregnant today.  She has no lower abdominal complaints to suggest ectopic pregnancy.  Followup with OB/GYN.  Lyanne Co, MD 12/10/12 [phone]

## 2012-12-10 NOTE — Telephone Encounter (Signed)
Left message for Erika Porter with appointment information.  Ileana Ladd

## 2012-12-10 NOTE — ED Provider Notes (Signed)
History    CSN: [id number] Arrival date & time 12/10/12  1223  First MD Initiated Contact with Patient 12/10/12 1341     No chief complaint on file.  (Consider location/radiation/quality/duration/timing/severity/associated sxs/prior Treatment) HPI  27 year old G61 P3 female with prior history of STD presents with multiple symptoms.  Patient states for the last month she has not been feeling herself. Report having headache, occasional nausea and vomiting, having chills, night sweats, sneezing, coughing, and headache. She also report that her abdomen "feels nasty" symptom has been waxing and waning, and for the past 4 or 5 days her nausea and vomiting has increased. Nausea and vomiting is primarily in the morning which improves throughout the day. Her last menstrual period was June 6 she has been sexually active with 5 partners within the past 6 months, using protection. Patient denies fever, chest pain, shortness of breath, dyspnea on exertion, and dysuria, hematuria, or vaginal discharge. She denies any rash.  Past Medical History  Diagnosis Date  . Chlamydia   . Trichomonas   . BV (bacterial vaginosis)    History reviewed. No pertinent past surgical history. History reviewed. No pertinent family history. History  Substance Use Topics  . Smoking status: Current Every Day Smoker  . Smokeless tobacco: Not on file  . Alcohol Use: Yes   OB History   Grav Para Term Preterm Abortions TAB SAB Ect Mult Living                 Review of Systems  All other systems reviewed and are negative.    Allergies  Review of patient's allergies indicates no known allergies.  Home Medications   Current Outpatient Rx  Name  Route  Sig  Dispense  Refill  . clindamycin (CLEOCIN T) 1 % external solution   Topical   Apply topically 2 (two) times daily. For hidradenitis (acne inversa)   30 mL   0   . medroxyPROGESTERone (DEPO-PROVERA) 150 MG/ML injection   Intramuscular   Inject 150 mg into  the muscle every 3 (three) months.           . traMADol (ULTRAM) 50 MG tablet   Oral   Take 1 tablet (50 mg total) by mouth every 6 (six) hours as needed for pain.   15 tablet   0    BP 109/76  Pulse 113  Temp(Src) 98.5 F (36.9 C) (Oral)  Resp 18  Ht 5\' 7"  (1.702 m)  Wt 130 lb (58.968 kg)  BMI 20.36 kg/m2  SpO2 100%  LMP 10/23/2012 Physical Exam  Nursing note and vitals reviewed. Constitutional: She appears well-developed and well-nourished. No distress.  HENT:  Head: Normocephalic and atraumatic.  Eyes: Conjunctivae are normal.  Neck: Normal range of motion. Neck supple.  Cardiovascular: Normal rate and regular rhythm.   Pulmonary/Chest: Effort normal and breath sounds normal. She exhibits no tenderness.  Abdominal: Soft. There is no tenderness.  Genitourinary: Uterus normal. There is no rash or lesion on the right labia. There is no rash or lesion on the left labia. Cervix exhibits no motion tenderness and no discharge. Right adnexum displays no mass and no tenderness. Left adnexum displays no mass and no tenderness. No erythema, tenderness or bleeding around the vagina. Vaginal discharge found.  Chaperone present:  A partially torn condom can be seen lodging in the posterior fornix.  Removal without any complication  No adnexal tenderness.    Lymphadenopathy:       Right: No inguinal  adenopathy present.       Left: No inguinal adenopathy present.    ED Course  Procedures (including critical care time)  2:36 PM Pt with sxs of morning sickness.  Pregnancy test positive.  No abd pain or vaginal bleeding.  Pelvic exam were significant for a partially torn retain condom which i removed.  GC/Ch culture sent, pt will be treated if positive. Otherwise pt stable for discharge.  Recommend OB f/u for further management of her pregnancy.  Zofran for nausea.  Prenatal care explained.  Safe sex practice discussed.  Care discussed with attending.  Labs Reviewed  URINALYSIS,  ROUTINE W REFLEX MICROSCOPIC - Abnormal; Notable for the following:    APPearance CLOUDY (*)    Specific Gravity, Urine 1.034 (*)    Bilirubin Urine SMALL (*)    Ketones, ur 15 (*)    Leukocytes, UA TRACE (*)    All other components within normal limits  URINE MICROSCOPIC-ADD ON - Abnormal; Notable for the following:    Squamous Epithelial / LPF MANY (*)    Bacteria, UA FEW (*)    All other components within normal limits  POCT PREGNANCY, URINE - Abnormal; Notable for the following:    Preg Test, Ur POSITIVE (*)    All other components within normal limits   No results found. 1. Pregnancy     MDM  BP 109/76  Pulse 113  Temp(Src) 98.5 F (36.9 C) (Oral)  Resp 18  Ht 5\' 7"  (1.702 m)  Wt 130 lb (58.968 kg)  BMI 20.36 kg/m2  SpO2 100%  LMP 10/23/2012   Fayrene Helper, PA-C 12/10/12 1440

## 2012-12-10 NOTE — ED Notes (Addendum)
Pt presents with multiple symptoms for 3-4 weeks.  Pt reports LMP was 6/6; reports nausea and vomiting; reports chills/sweats; reports her abdomen "feels nasty"; reports generalized weakness and fatigue.

## 2012-12-10 NOTE — ED Notes (Signed)
Pt c/o nausea X 3 days, sts that its worse in the morning then usually subsides throughout the day. Hasn't been able to eat much the past couple days because of the nausea.sts when she smokes she is able to eat because it seems to help with the nausea, sts she is trying to quick but hasn't been able to yet. Denies abd pain. sts she hasn't gotten her period this month and thinks she is either pregnant or has some sort of virus. Pt in nad, skin warm and dry, resp e/u.

## 2012-12-10 NOTE — Telephone Encounter (Signed)
Pt is calling for a new OB appointment JW °

## 2012-12-28 ENCOUNTER — Other Ambulatory Visit: Payer: Medicaid Other

## 2012-12-28 DIAGNOSIS — Z331 Pregnant state, incidental: Secondary | ICD-10-CM

## 2012-12-28 LAB — OB RESULTS CONSOLE GBS: GBS: NEGATIVE

## 2012-12-28 NOTE — Progress Notes (Signed)
PRENATAL LABS DONE TODAY Erika Porter 

## 2012-12-29 ENCOUNTER — Other Ambulatory Visit: Payer: Medicaid Other

## 2012-12-29 LAB — OBSTETRIC PANEL
Antibody Screen: NEGATIVE
Basophils Absolute: 0 10*3/uL (ref 0.0–0.1)
Basophils Relative: 0 % (ref 0–1)
HCT: 31.9 % — ABNORMAL LOW (ref 36.0–46.0)
Lymphocytes Relative: 30 % (ref 12–46)
MCHC: 32.6 g/dL (ref 30.0–36.0)
Monocytes Absolute: 0.8 10*3/uL (ref 0.1–1.0)
Neutro Abs: 6 10*3/uL (ref 1.7–7.7)
Platelets: 242 10*3/uL (ref 150–400)
RDW: 14.1 % (ref 11.5–15.5)
WBC: 10.1 10*3/uL (ref 4.0–10.5)

## 2012-12-29 LAB — HIV ANTIBODY (ROUTINE TESTING W REFLEX): HIV: NONREACTIVE

## 2012-12-30 LAB — CULTURE, OB URINE
Colony Count: NO GROWTH
Organism ID, Bacteria: NO GROWTH

## 2013-01-05 ENCOUNTER — Encounter: Payer: Medicaid Other | Admitting: Family Medicine

## 2013-01-07 ENCOUNTER — Inpatient Hospital Stay (HOSPITAL_COMMUNITY): Payer: Medicaid Other

## 2013-01-07 ENCOUNTER — Encounter (HOSPITAL_COMMUNITY): Payer: Self-pay

## 2013-01-07 ENCOUNTER — Inpatient Hospital Stay (HOSPITAL_COMMUNITY)
Admission: AD | Admit: 2013-01-07 | Discharge: 2013-01-07 | Disposition: A | Discharge status: Home / Self Care | Payer: Medicaid Other | Source: Ambulatory Visit | Attending: Obstetrics & Gynecology | Admitting: Obstetrics & Gynecology

## 2013-01-07 DIAGNOSIS — R059 Cough, unspecified: Secondary | ICD-10-CM

## 2013-01-07 DIAGNOSIS — R1110 Vomiting, unspecified: Secondary | ICD-10-CM

## 2013-01-07 DIAGNOSIS — R05 Cough: Secondary | ICD-10-CM | POA: Insufficient documentation

## 2013-01-07 DIAGNOSIS — O2341 Unspecified infection of urinary tract in pregnancy, first trimester: Secondary | ICD-10-CM

## 2013-01-07 DIAGNOSIS — N949 Unspecified condition associated with female genital organs and menstrual cycle: Secondary | ICD-10-CM | POA: Insufficient documentation

## 2013-01-07 DIAGNOSIS — O99891 Other specified diseases and conditions complicating pregnancy: Secondary | ICD-10-CM | POA: Insufficient documentation

## 2013-01-07 DIAGNOSIS — R109 Unspecified abdominal pain: Secondary | ICD-10-CM | POA: Insufficient documentation

## 2013-01-07 DIAGNOSIS — M549 Dorsalgia, unspecified: Secondary | ICD-10-CM

## 2013-01-07 HISTORY — DX: Anemia, unspecified: D64.9

## 2013-01-07 LAB — URINALYSIS, ROUTINE W REFLEX MICROSCOPIC
Bilirubin Urine: NEGATIVE
Ketones, ur: NEGATIVE mg/dL
Nitrite: NEGATIVE
Specific Gravity, Urine: >1.03 — ABNORMAL HIGH (ref 1.005–1.030)
Urobilinogen, UA: 0.2 mg/dL (ref 0.0–1.0)

## 2013-01-07 LAB — OB RESULTS CONSOLE GC/CHLAMYDIA
CHLAMYDIA, DNA PROBE: NEGATIVE
Gonorrhea: NEGATIVE

## 2013-01-07 LAB — WET PREP, GENITAL: Clue Cells Wet Prep HPF POC: NONE SEEN

## 2013-01-07 MED ORDER — ONDANSETRON HCL 4 MG PO TABS: 4.0000 mg | ORAL_TABLET | Freq: Four times a day (QID) | ORAL | Status: DC

## 2013-01-07 MED ORDER — PROMETHAZINE HCL 25 MG PO TABS: 25.0000 mg | ORAL_TABLET | Freq: Four times a day (QID) | ORAL | Status: DC | PRN

## 2013-01-07 MED ORDER — ONDANSETRON 8 MG PO TBDP
8.0000 mg | ORAL_TABLET | Freq: Once | ORAL | Status: AC
  Administered 2013-01-07: 8 mg via ORAL
  Filled 2013-01-07: qty 1

## 2013-01-07 MED ORDER — DM-GUAIFENESIN ER 30-600 MG PO TB12: 1.0000 | ORAL_TABLET | Freq: Two times a day (BID) | ORAL | Status: DC

## 2013-01-07 NOTE — MAU Note (Signed)
Patient state she has had a cough for about one week. Is now having abdominal pain with the coughing. Has a vaginal discharge. Has tried OTC medication but not working.

## 2013-01-07 NOTE — MAU Note (Signed)
Coughing, brings on gag reflex and vomiting.  Tired of taking meds, nothing is working.

## 2013-01-07 NOTE — MAU Provider Note (Signed)
History     CSN: [id number]  Arrival date and time: 01/07/13 1128   First Provider Initiated Contact with Patient 01/07/13 1228      Chief Complaint  Patient presents with  . Cough  . Emesis  . Abdominal Pain  . Vaginal Discharge   HPI  Erika Porter is a [id number] who presents today with coughing that leads to emesis.  She is [redacted]w[redacted]d.  Her pregnancy was confirmed today at the MAU.  Her cough began a week ago.  She reports contracting it from her children, one of which still has the cough, but the severity is lessening. She describes it as a dry, non productive cough.  She is in no pain today.  She denies feeling the baby move, and says the nurse was unable to get heart tones.  She denies abdominal pain.  She reports discharge from the vagina but denies blood.      Past Medical History  Diagnosis Date  . Chlamydia   . Trichomonas   . BV (bacterial vaginosis)   . Anemia     Past Surgical History  Procedure Laterality Date  . No past surgeries      Family History  Problem Relation Age of Onset  . Hypertension Mother   . Anxiety disorder Mother   . Asthma Sister   . Asthma Daughter   . Asthma Son   . Cancer Maternal Aunt     History  Substance Use Topics  . Smoking status: Former Smoker    Types: Cigarettes  . Smokeless tobacco: Not on file     Comment: quit with preg  . Alcohol Use: Yes     Comment: not with preg    Allergies: No Known Allergies  Prescriptions prior to admission  Medication Sig Dispense Refill  . flintstones complete (FLINTSTONES) 60 MG chewable tablet Chew 2 tablets by mouth daily.        Review of Systems  Constitutional: Negative for fever, chills, weight loss, malaise/fatigue and diaphoresis.  Eyes: Negative for blurred vision, double vision, photophobia and pain.  Gastrointestinal: Negative for heartburn, nausea, vomiting and abdominal pain.  Genitourinary: Negative for dysuria, urgency and frequency.  Skin: Negative for itching and  rash.  Neurological: Negative for tingling, tremors, sensory change, weakness and headaches.   Physical Exam   Blood pressure 116/69, pulse 84, temperature 98.3 F (36.8 C), temperature source Oral, resp. rate 18, height 5\' 7"  (1.702 m), weight 63.413 kg (139 lb 12.8 oz), last menstrual period 10/23/2012, SpO2 100.00%.  Physical Exam  Constitutional: She is oriented to person, place, and time. Vital signs are normal. She appears well-developed and well-nourished.  Non-toxic appearance. She does not have a sickly appearance. She does not appear ill. No distress.  HENT:  Head: Normocephalic and atraumatic.  Mouth/Throat: No oropharyngeal exudate.  Eyes: Conjunctivae are normal. Right eye exhibits no discharge. Left eye exhibits no discharge. Right conjunctiva has no hemorrhage. Left conjunctiva has no hemorrhage. No scleral icterus. Pupils are equal.  Neck: Neck supple. No mass present.  Cardiovascular: Normal rate and regular rhythm.   Respiratory: Effort normal and breath sounds normal. No respiratory distress. She has no wheezes. She has no rales.  GI: Soft.  Genitourinary: Vagina normal and uterus normal. Pelvic exam was performed with patient supine. No labial fusion. There is no rash, tenderness, lesion or injury on the right labia. There is no rash, tenderness, lesion or injury on the left labia. No erythema, tenderness or bleeding around the  vagina. No foreign body around the vagina. No signs of injury around the vagina. No vaginal discharge found.  Cervix parous, closed; Uterus ULNS, non tender; adnexa without palpable enlargment or tenderness  Musculoskeletal: She exhibits no edema and no tenderness.  Neurological: She is alert and oriented to person, place, and time. She has normal reflexes. No cranial nerve deficit.  Skin: Skin is warm and dry. No rash noted. No erythema. No pallor.  Psychiatric: She has a normal mood and affect. Her behavior is normal. Judgment and thought content  normal.    MAU Course  Procedures  Results for orders placed during the hospital encounter of 01/07/13 (from the past 24 hour(s))  URINALYSIS, ROUTINE W REFLEX MICROSCOPIC     Status: Abnormal   Collection Time    01/07/13 11:55 AM      Result Value Range   Color, Urine YELLOW  YELLOW   APPearance HAZY (*) CLEAR   Specific Gravity, Urine >1.030 (*) 1.005 - 1.030   pH 6.0  5.0 - 8.0   Glucose, UA NEGATIVE  NEGATIVE mg/dL   Hgb urine dipstick NEGATIVE  NEGATIVE   Bilirubin Urine NEGATIVE  NEGATIVE   Ketones, ur NEGATIVE  NEGATIVE mg/dL   Protein, ur NEGATIVE  NEGATIVE mg/dL   Urobilinogen, UA 0.2  0.0 - 1.0 mg/dL   Nitrite NEGATIVE  NEGATIVE   Leukocytes, UA TRACE (*) NEGATIVE  URINE MICROSCOPIC-ADD ON     Status: Abnormal   Collection Time    01/07/13 11:55 AM      Result Value Range   Squamous Epithelial / LPF MANY (*) RARE   WBC, UA 0-2  <3 WBC/hpf   Bacteria, UA FEW (*) RARE  WET PREP, GENITAL     Status: Abnormal   Collection Time    01/07/13  1:30 PM      Result Value Range   Yeast Wet Prep HPF POC NONE SEEN  NONE SEEN   Trich, Wet Prep NONE SEEN  NONE SEEN   Clue Cells Wet Prep HPF POC NONE SEEN  NONE SEEN   WBC, Wet Prep HPF POC FEW (*) NONE SEEN     No results found for this or any previous visit (from the past 24 hour(s)).      Assessment and Plan   1. Cough   2. Emesis   3. Back pain in pregnancy, first trimester     Cough/Dextromethorphan 10-20 mg PO q4h Emesis/Zofran 4mg  tablets, phenergan 25mg , start Diclegis Back Pain/Tylenol OTC 1-2 tabs PO q4-6h   I have seen pt with PA student and agree with note and evaluation and management of pt Pamelia Hoit, WHNP-BC CLARK, MICHAEL L 01/07/2013, 12:36 PM

## 2013-01-07 NOTE — MAU Provider Note (Signed)
Attestation of Attending Supervision of Advanced Practitioner (CNM/NP): Evaluation and management procedures were performed by the Advanced Practitioner under my supervision and collaboration.  I have reviewed the Advanced Practitioner's note and chart, and I agree with the management and plan.  HARRAWAY-SMITH, Wilbur Labuda 11:54 PM     

## 2013-01-08 LAB — GC/CHLAMYDIA PROBE AMP: GC Probe RNA: NEGATIVE

## 2013-03-04 ENCOUNTER — Encounter: Payer: Medicaid Other | Admitting: Family Medicine

## 2013-03-24 ENCOUNTER — Encounter: Payer: Self-pay | Admitting: Family Medicine

## 2013-03-24 ENCOUNTER — Telehealth: Payer: Self-pay | Admitting: Family Medicine

## 2013-03-24 ENCOUNTER — Ambulatory Visit (INDEPENDENT_AMBULATORY_CARE_PROVIDER_SITE_OTHER): Payer: Medicaid Other | Admitting: Family Medicine

## 2013-03-24 ENCOUNTER — Other Ambulatory Visit (HOSPITAL_COMMUNITY)
Admission: RE | Admit: 2013-03-24 | Discharge: 2013-03-24 | Disposition: A | Discharge status: Home / Self Care | Payer: Medicaid Other | Source: Ambulatory Visit | Attending: Family Medicine | Admitting: Family Medicine

## 2013-03-24 VITALS — BP 120/75 | Temp 98.1°F | Wt 140.0 lb

## 2013-03-24 DIAGNOSIS — Z3482 Encounter for supervision of other normal pregnancy, second trimester: Secondary | ICD-10-CM

## 2013-03-24 DIAGNOSIS — Z01419 Encounter for gynecological examination (general) (routine) without abnormal findings: Secondary | ICD-10-CM | POA: Insufficient documentation

## 2013-03-24 DIAGNOSIS — Z3492 Encounter for supervision of normal pregnancy, unspecified, second trimester: Secondary | ICD-10-CM

## 2013-03-24 DIAGNOSIS — Z3480 Encounter for supervision of other normal pregnancy, unspecified trimester: Secondary | ICD-10-CM

## 2013-03-24 DIAGNOSIS — Z113 Encounter for screening for infections with a predominantly sexual mode of transmission: Secondary | ICD-10-CM | POA: Insufficient documentation

## 2013-03-24 LAB — POCT WET PREP (WET MOUNT)

## 2013-03-24 MED ORDER — PRENATAL VITAMINS 28-0.8 MG PO TABS: 1.0000 | ORAL_TABLET | Freq: Every day | ORAL | Status: DC

## 2013-03-24 NOTE — Telephone Encounter (Signed)
Called patient to touch base about her new OB visit. Patient has no showed her previous new OB visits. Wanted to discuss this with the patient and ensure that she makes it to her appointment this morning. I left a message for the patient to call us back.

## 2013-03-24 NOTE — Progress Notes (Signed)
Erika Porter is a 27 y.o. yo [phone] at [redacted]w[redacted]d who presents for her initial prenatal visit. Pregnancy  is not planned She reports no complaints. She  is notTaking PNV. See flow sheet for details.  PMH, POBH, FH, meds, allergies and Social Hx reviewed.  Prenatal exam:Gen: Well nourished, well developed.  No distress.  Vitals noted. HEENT: Normocephalic, atraumatic. Abd: soft, NTND. +BS.  Fundus appreciated at 20.5 cm from pubis. GU: Normal external female genitalia without lesions.  Nl vaginal, well rugated without lesions. Minimal white vaginal discharge.  Bimanual exam: No adnexal mass or TTP. No CMT.  Ext: No clubbing, cyanosis or edema. Psych: Normal grooming and dress.  Not anxious appearing. Affect does not appear depressed, though reports depressed mood. Normal thought content and process without flight of ideas or looseness of associations  PHQ-9 score: 14  Assessment/plan: 1) Pregnancy [redacted]w[redacted]d doing well.  Current pregnancy issues include depression, tobacco abuse, and marijuana abuse. Discussed quitting smoking and marijuana. Discussed depression with the patient and will discuss starting SSRI with Dr Mauricio Po. Dating is reliable Prenatal labs reviewed, notable for anemia. Bleeding and pain precautions reviewed. Importance of prenatal vitamins reviewed and prescribed.  Patient is past dates for genetic screening.  Early glucola is not indicated.    Follow up 4 weeks in OB clinic.

## 2013-03-24 NOTE — Patient Instructions (Addendum)
Nice to meet you. Congratulations on having another baby. I will send a prescription for prenatal vitamins to your pharmacy. Please take these every day. It is very important for the health of your baby that you quit smoking cigarettes and marijuana. Please let us know if your depression worsens. I will discuss this with our OB clinic attending and give you a call regarding the plan. We will schedule your anatomy ultrasound.  Pregnancy - Second Trimester The second trimester is the period between 13 to 27 weeks of your pregnancy. It is important to follow your doctor's instructions. HOME CARE   Do not smoke.  Do not drink alcohol or use drugs.  Only take medicine as told by your doctor.  Take prenatal vitamins as told. The vitamin should contain 1 milligram of folic acid.  Exercise.  Eat healthy foods. Eat regular, well-balanced meals.  You can have sex (intercourse) if there are no other problems with the pregnancy.  Do not use hot tubs, steam rooms, or saunas.  Wear a seat belt while driving.  Avoid raw meat, uncooked cheese, and litter boxes and soil used by cats.  Visit your dentist. Shirlee Limerick are okay. GET HELP RIGHT AWAY IF:   You have a temperature by mouth above 102 F (38.9 C), not controlled by medicine.  Fluid is coming from your vagina.  Blood is coming from your vagina. Light spotting is common, especially after sex (intercourse).  You have a bad smelling fluid (discharge) coming from the vagina. The fluid changes from clear to white.  You still feel sick to your stomach (nauseous).  You throw up (vomit) blood.  You lose or gain more than 2 pounds (0.9 kilograms) of weight in a week, or as suggested by your doctor.  Your face, hands, feet, or legs get puffy (swell).  You get exposed to [place] measles and have never had them.  You get exposed to fifth disease or chickenpox.  You have belly (abdominal) pain.  You have a bad headache that will not go  away.  You have watery poop (diarrhea), pain when you pee (urinate), or have shortness of breath.  You start to have problems seeing (blurry or double vision).  You fall, are in a car accident, or have any kind of trauma.  There is mental or physical violence at home.  You have any concerns or worries during your pregnancy. MAKE SURE YOU:   Understand these instructions.  Will watch your condition.  Will get help right away if you are not doing well or get worse. Document Released: 07/31/2009 Document Revised: 07/29/2011 Document Reviewed: 07/31/2009 [facility] Patient Information 2014 [place], [place].

## 2013-03-26 ENCOUNTER — Ambulatory Visit (HOSPITAL_COMMUNITY)
Admission: RE | Admit: 2013-03-26 | Discharge: 2013-03-26 | Disposition: A | Discharge status: Home / Self Care | Payer: Medicaid Other | Source: Ambulatory Visit | Attending: Family Medicine | Admitting: Family Medicine

## 2013-03-26 ENCOUNTER — Other Ambulatory Visit: Payer: Self-pay | Admitting: Family Medicine

## 2013-03-26 ENCOUNTER — Telehealth: Payer: Self-pay | Admitting: Family Medicine

## 2013-03-26 DIAGNOSIS — Z3689 Encounter for other specified antenatal screening: Secondary | ICD-10-CM | POA: Insufficient documentation

## 2013-03-26 DIAGNOSIS — Z3492 Encounter for supervision of normal pregnancy, unspecified, second trimester: Secondary | ICD-10-CM

## 2013-03-26 MED ORDER — METRONIDAZOLE 500 MG PO TABS: 500.0000 mg | ORAL_TABLET | Freq: Two times a day (BID) | ORAL | Status: DC

## 2013-03-26 NOTE — Telephone Encounter (Signed)
Spoke with patient regarding BV on wet prep. Will treat with flagyl 500 mg BID for 7 days.

## 2013-04-07 ENCOUNTER — Ambulatory Visit (INDEPENDENT_AMBULATORY_CARE_PROVIDER_SITE_OTHER): Payer: Medicaid Other | Admitting: Family Medicine

## 2013-04-08 ENCOUNTER — Ambulatory Visit (INDEPENDENT_AMBULATORY_CARE_PROVIDER_SITE_OTHER): Payer: Medicaid Other | Admitting: Family Medicine

## 2013-04-08 VITALS — BP 108/65 | Temp 99.5°F | Wt 142.0 lb

## 2013-04-08 DIAGNOSIS — N611 Abscess of the breast and nipple: Secondary | ICD-10-CM | POA: Insufficient documentation

## 2013-04-08 DIAGNOSIS — N61 Mastitis without abscess: Secondary | ICD-10-CM

## 2013-04-08 NOTE — Progress Notes (Signed)
Family Medicine Office Visit Note   Subjective:   Patient ID: Erika Porter, female  DOB: 1986/01/04, 27 y.o.. MRN: [id number]   Pt that comes today for same day appointment complaining of breast "bump". She reports having this for a while but in the past 2 weeks it has increased in size and now is very tender and intermittently draining. Pt reports is painful to the touch but denies any surrounded redness or swelling. Denies fever, chills, or other systemic symptoms. She is pregnant and denies abdominal pain, vaginal bleeding or discharge.  Review of Systems:  Per HPI  Objective:   Physical Exam: Gen:  NAD HEENT: Moist mucous membranes  CV: Regular rate and rhythm, no murmurs rubs or gallops PULM: Clear to auscultation bilaterally.  Left breast: lesion of 2cm in location 12 o clock of her nipple. No surrounded erythema or edema. No axillary adenopathy. Right breast is normal.   Assessment & Plan:    Incision and Drainage Procedure Note  Pre-operative Diagnosis: abscess on left nipple  Post-operative Diagnosis: same s/p drainage.  Anesthesia: topical lidocaine (EMLA)  Procedure Details  The procedure, risks and complications have been discussed in detail (including, but not limited to airway compromise, infection, bleeding) with the patient, and the patient has signed consent to the procedure.  The skin was sterilely prepped and draped over the affected area in the usual fashion. After adequate local anesthesia, I&D with a #11 blade was performed on the left nipple. Purulent drainage: present The patient was observed until stable.  Findings: Encapsulated abscess with thick purulent content.   EBL: 30 ml.  Drains: none  Condition: Tolerated procedure well  Complications: none.

## 2013-04-08 NOTE — Patient Instructions (Addendum)
Abscess Care After An abscess (also called a boil or furuncle) is an infected area that contains a collection of pus. Signs and symptoms of an abscess include pain, tenderness, redness, or hardness, or you may feel a moveable soft area under your skin. An abscess can occur anywhere in the body. The infection may spread to surrounding tissues causing cellulitis. A cut (incision) by the surgeon was made over your abscess and the pus was drained out. Gauze may have been packed into the space to provide a drain that will allow the cavity to heal from the inside outwards. The boil may be painful for 5 to 7 days. Most people with a boil do not have high fevers. Your abscess, if seen early, may not have localized, and may not have been lanced. If not, another appointment may be required for this if it does not get better on its own or with medications. HOME CARE INSTRUCTIONS   Only take over-the-counter or prescription medicines for pain, discomfort, or fever as directed by your caregiver.  When you bathe, soak and then remove gauze or iodoform packs at least daily or as directed by your caregiver. You may then wash the wound gently with mild soapy water. Repack with gauze or do as your caregiver directs. SEEK IMMEDIATE MEDICAL CARE IF:   You develop increased pain, swelling, redness, drainage, or bleeding in the wound site.  You develop signs of generalized infection including muscle aches, chills, fever, or a general ill feeling.  An oral temperature above 102 F (38.9 C) develops, not controlled by medication. See your caregiver for a recheck if you develop any of the symptoms described above. If medications (antibiotics) were prescribed, take them as directed.  Follow up next Monday.

## 2013-04-08 NOTE — Assessment & Plan Note (Signed)
I&D performed on 04/08/2013. No signs of mastitis or systemic symptoms.  Discussed signs of worsening condition that should prompt emergent re-evaluation and recommended f/u next Monday.

## 2013-04-12 ENCOUNTER — Ambulatory Visit (INDEPENDENT_AMBULATORY_CARE_PROVIDER_SITE_OTHER): Payer: Medicaid Other | Admitting: Family Medicine

## 2013-04-12 VITALS — BP 108/69 | Wt 144.0 lb

## 2013-04-12 DIAGNOSIS — N611 Abscess of the breast and nipple: Secondary | ICD-10-CM

## 2013-04-12 DIAGNOSIS — N61 Mastitis without abscess: Secondary | ICD-10-CM

## 2013-04-12 NOTE — Progress Notes (Signed)
Family Medicine Office Visit Note    Subjective:   Patient ID: Erika Porter, female DOB: 06-06-1985, 27 y.o.. MRN: [id number]   HPI: Breast Abscess, Left Presents today for f/u apt after recently I&D Left Breast (Nipple) abscess on 04/08/13. She reports that she tolerated the procedure well, and since has significantly improved. No return of warmth, redness, or swelling at previous abscess site. Denies any tenderness to touch. Had kept site covered with a gauze bandage, without any drainage. No further concerns or complaints at this time.  Note she was not started on any antibiotics. But does have 1-2 days remaining of Flagyl course for BV.  Review of Systems:  Per HPI as above. Additionally, denies any fever/chills.  Objective:   Physical Exam:  BP 108/69  Wt 144 lb (65.318 kg)  LMP 10/23/2012 Gen: well-appearing, NAD  HEENT: pharynx clear, MMM Heart: RRR, no murmurs heard Lungs - CTAB Left breast: healed incision without any residual erythema, warmth, tenderness, or fluctuance. Previously lesion of 2cm in location 12 o clock of her nipple. No axillary adenopathy. Right breast is normal.

## 2013-04-12 NOTE — Patient Instructions (Signed)
Dear Erika Porter, Thank you for coming in to clinic today. It was good to meet you.  Today we discussed your Left Breast Abscess. 1. It looks like your abscess has healed nicely. It is no longer red, swollen, or painful. 2. You no longer need the bandage. 3. Make sure you wash it with soap and water as usual, and it should continue to do well.  If the abscess feels like it is coming back, gets more painful, red or swollen. Please call our clinic and schedule an appointment sooner and we can examine it, and may drain it again. Otherwise, you may need a course of antibiotics.  Please keep your upcoming OB appointment as previously scheduled 04/22/2013 (Thursday 11:30am)  If you have any other questions or concerns, please feel free to call the clinic to contact me. You may also schedule an earlier appointment if necessary.  However, if your symptoms get significantly worse, please go to the Emergency Department to seek immediate medical attention.  Saralyn Pilar, DO [facility]

## 2013-04-12 NOTE — Assessment & Plan Note (Addendum)
Resolved L-breast nipple abscess, s/p I&D 11/20. No recurrence warmth, erythema, tenderness, or fluctuance.  Plan: 1. No further treatment at this time. 2. No indication for further I&D or antibiotics.

## 2013-04-22 ENCOUNTER — Encounter: Payer: Self-pay | Admitting: Family Medicine

## 2013-05-03 ENCOUNTER — Emergency Department (HOSPITAL_COMMUNITY)
Admission: EM | Admit: 2013-05-03 | Discharge: 2013-05-03 | Disposition: A | Discharge status: Home / Self Care | Payer: Medicaid Other | Attending: Emergency Medicine | Admitting: Emergency Medicine

## 2013-05-03 DIAGNOSIS — Z8619 Personal history of other infectious and parasitic diseases: Secondary | ICD-10-CM | POA: Insufficient documentation

## 2013-05-03 DIAGNOSIS — O9989 Other specified diseases and conditions complicating pregnancy, childbirth and the puerperium: Secondary | ICD-10-CM | POA: Insufficient documentation

## 2013-05-03 DIAGNOSIS — Z862 Personal history of diseases of the blood and blood-forming organs and certain disorders involving the immune mechanism: Secondary | ICD-10-CM | POA: Insufficient documentation

## 2013-05-03 DIAGNOSIS — J069 Acute upper respiratory infection, unspecified: Secondary | ICD-10-CM

## 2013-05-03 DIAGNOSIS — Z8742 Personal history of other diseases of the female genital tract: Secondary | ICD-10-CM | POA: Insufficient documentation

## 2013-05-03 DIAGNOSIS — Z87891 Personal history of nicotine dependence: Secondary | ICD-10-CM | POA: Insufficient documentation

## 2013-05-03 NOTE — ED Provider Notes (Addendum)
Medical screening examination/treatment/procedure(s) were conducted as a shared visit with non-physician practitioner(s) and myself.  I personally evaluated the patient during the encounter.  EKG Interpretation   None       Pt c/o nasal congestion, non prod cough. No sob. No abd pain or vag bleeding. Pregnant, states +pnc, normal prior ultrasounds.  Pt denies any complications w pregnancy.   Pt denies headache. No sob. No leg swelling or pain. Chest cta. abd soft nt.   Exam c/w viral uri.   Suzi Roots, MD 05/03/13 [id number]  Suzi Roots, MD 05/03/13 [phone]

## 2013-05-03 NOTE — ED Notes (Addendum)
Pt had intermittent chest and back pain along with headaches, but reports improvement now.  "They are not constant like they were."  Pt also has a cough.  Pt is almost 6 months pregnant.  Pt has had prenatal care and has been taking prenatal vitamins.  Pt denies any fevers.  Pt just recently drove from [place] on Saturday, straight through (10 hours).

## 2013-05-03 NOTE — ED Notes (Signed)
ekg obtained and shown to EDP, pt taken to peds with daughter (also a pt) and she will come back to adult side once daughter is discharged

## 2013-05-03 NOTE — Discharge Instructions (Signed)
Read the instructions below on reasons to return to the emergency department and to learn more about your diagnosis.  Use over the counter medications for symptomatic relief as we discussed (Tylenol for fever/pain)  Followup with your primary care doctor in 4 days if your symptoms persist.  Your more than welcome to return to the emergency department if symptoms worsen or become concerning.  Upper Respiratory Infection, Adult  An upper respiratory infection (URI) is also sometimes known as the common cold. Most people improve within 1 week, but symptoms can last up to 2 weeks. A residual cough may last even longer.   URI is most commonly caused by a virus. Viruses are NOT treated with antibiotics. You can easily spread the virus to others by oral contact. This includes kissing, sharing a glass, coughing, or sneezing. Touching your mouth or nose and then touching a surface, which is then touched by another person, can also spread the virus.   TREATMENT  Treatment is directed at relieving symptoms. There is no cure. Antibiotics are not effective, because the infection is caused by a virus, not by bacteria. Treatment may include:  Increased fluid intake. Sports drinks offer valuable electrolytes, sugars, and fluids.  Breathing heated mist or steam (vaporizer or shower).  Eating chicken soup or other clear broths, and maintaining good nutrition.  Getting plenty of rest.  Using gargles or lozenges for comfort.  Controlling fevers with ibuprofen or acetaminophen as directed by your caregiver.  Increasing usage of your inhaler if you have asthma.  Return to work when your temperature has returned to normal.   SEEK MEDICAL CARE IF:  After the first few days, you feel you are getting worse rather than better.  You develop worsening shortness of breath, or brown or red sputum. These may be signs of pneumonia.  You develop yellow or brown nasal discharge or pain in the face, especially when you bend  forward. These may be signs of sinusitis.  You develop a fever, swollen neck glands, pain with swallowing, or white areas in the back of your throat. These may be signs of strep throat.

## 2013-05-03 NOTE — ED Notes (Signed)
MD and PA at bedside for evaluation.

## 2013-05-03 NOTE — ED Provider Notes (Signed)
CSN: [id number]     Arrival date & time 05/03/13  1125 History   First MD Initiated Contact with Patient 05/03/13 1159     Chief Complaint  Patient presents with  . Cough  . Chest Pain   (Consider location/radiation/quality/duration/timing/severity/associated sxs/prior Treatment) HPI Comments: Patient who is currently pregnant presents to the ED today with a chief complaint of cough and nasal congestion.  She reports that her symptoms have been present for the past three days.  Symptoms gradually worsening.  She has not taken anything for symptoms prior to arrival.  She also reports that she has also had intermittent chest pain over the past 3 days, but denies any chest pain at this time.  She denies any SOB at this time.  Denies lower extremity edema.  Denies dizziness, lightheadedness, or syncope.  She denies any abdominal pain.  No vaginal bleeding or vaginal discharge.  She reports that she is currently approximately [redacted] weeks pregnant.  She reports that she has been getting prenatal care and has had ultrasounds done, which were normal.    Past Medical History  Diagnosis Date  . Chlamydia   . Trichomonas   . BV (bacterial vaginosis)   . Anemia    Past Surgical History  Procedure Laterality Date  . No past surgeries     Family History  Problem Relation Age of Onset  . Hypertension Mother   . Anxiety disorder Mother   . Asthma Sister   . Asthma Daughter   . Asthma Son   . Cancer Maternal Aunt    History  Substance Use Topics  . Smoking status: Former Smoker -- 0.10 packs/day    Types: Cigarettes  . Smokeless tobacco: Not on file     Comment: quit with preg  . Alcohol Use: Yes     Comment: not with preg   OB History   Grav Para Term Preterm Abortions TAB SAB Ect Mult Living   4 3 3  0 0 0 0 0 0 3     Review of Systems  All other systems reviewed and are negative.    Allergies  Review of patient's allergies indicates no known allergies.  Home Medications    Current Outpatient Rx  Name  Route  Sig  Dispense  Refill  . Prenatal Vit-Fe Fumarate-FA (PRENATAL VITAMINS) 28-0.8 MG TABS   Oral   Take 1 tablet by mouth daily.   30 tablet   6    BP 114/84  Pulse 92  Temp(Src) 99 F (37.2 C) (Oral)  Resp 16  Ht 5\' 7"  (1.702 m)  Wt 139 lb 11.2 oz (63.368 kg)  BMI 21.88 kg/m2  SpO2 100%  LMP 10/23/2012 Physical Exam  Nursing note and vitals reviewed. Constitutional: She appears well-developed and well-nourished. No distress.  HENT:  Head: Normocephalic and atraumatic.  Neck: Normal range of motion. Neck supple.  Cardiovascular: Normal rate, regular rhythm and normal heart sounds.   Pulmonary/Chest: Effort normal and breath sounds normal.  Abdominal: Soft. Bowel sounds are normal.  Musculoskeletal: Normal range of motion.  No lower extremity edema bilaterally  Neurological: She is alert.  Skin: Skin is warm and dry. She is not diaphoretic.  Psychiatric: She has a normal mood and affect.    ED Course  Procedures (including critical care time) Labs Review Labs Reviewed - No data to display Imaging Review No results found.  EKG Interpretation   None      Patient discussed with Dr Denton Lank who also evaluated  the patient. MDM  No diagnosis found. Patient presenting with symptoms consistent with URI, most likely viral.  Pulse ox 100 on RA.  Patient afebrile.  No tachycardia.  Lungs CTAB.  Therefore, do not feel that CXR is indicated at this time.  Patient denies any chest pain or SOB at this time.  No lower extremity edema.  Feel that the patient is stable for discharge.  Strict return precautions given.    Santiago Glad, PA-C 05/03/13 [phone]

## 2013-05-07 ENCOUNTER — Ambulatory Visit (INDEPENDENT_AMBULATORY_CARE_PROVIDER_SITE_OTHER): Payer: Medicaid Other | Admitting: Family Medicine

## 2013-05-07 VITALS — BP 120/79 | Temp 98.3°F | Wt 139.0 lb

## 2013-05-07 DIAGNOSIS — F1910 Other psychoactive substance abuse, uncomplicated: Secondary | ICD-10-CM

## 2013-05-07 DIAGNOSIS — F17200 Nicotine dependence, unspecified, uncomplicated: Secondary | ICD-10-CM

## 2013-05-07 DIAGNOSIS — Z3480 Encounter for supervision of other normal pregnancy, unspecified trimester: Secondary | ICD-10-CM

## 2013-05-07 DIAGNOSIS — Z3483 Encounter for supervision of other normal pregnancy, third trimester: Secondary | ICD-10-CM

## 2013-05-07 LAB — CBC
Hemoglobin: 10.4 g/dL — ABNORMAL LOW (ref 12.0–15.0)
MCH: 25.7 pg — ABNORMAL LOW (ref 26.0–34.0)
Platelets: 232 10*3/uL (ref 150–400)
RBC: 4.05 MIL/uL (ref 3.87–5.11)
RDW: 14.5 % (ref 11.5–15.5)

## 2013-05-07 LAB — GLUCOSE, CAPILLARY
Comment 1: 1
Glucose-Capillary: 96 mg/dL (ref 70–99)

## 2013-05-07 LAB — HIV ANTIBODY (ROUTINE TESTING W REFLEX): HIV: NONREACTIVE

## 2013-05-07 MED ORDER — RANITIDINE HCL 150 MG PO TABS: 150.0000 mg | ORAL_TABLET | Freq: Two times a day (BID) | ORAL | Status: DC

## 2013-05-07 NOTE — Patient Instructions (Signed)
Third Trimester of Pregnancy  The third trimester is from week 29 through week 42, months 7 through 9. The third trimester is a time when the fetus is growing rapidly. At the end of the ninth month, the fetus is about 20 inches in length and weighs 6 10 pounds.   BODY CHANGES  Your body goes through many changes during pregnancy. The changes vary from woman to woman.    Your weight will continue to increase. You can expect to gain 25 35 pounds (11 16 kg) by the end of the pregnancy.   You may begin to get stretch marks on your hips, abdomen, and breasts.   You may urinate more often because the fetus is moving lower into your pelvis and pressing on your bladder.   You may develop or continue to have heartburn as a result of your pregnancy.   You may develop constipation because certain hormones are causing the muscles that push waste through your intestines to slow down.   You may develop hemorrhoids or swollen, bulging veins (varicose veins).   You may have pelvic pain because of the weight gain and pregnancy hormones relaxing your joints between the bones in your pelvis. Back aches may result from over exertion of the muscles supporting your posture.   Your breasts will continue to grow and be tender. A yellow discharge may leak from your breasts called colostrum.   Your belly button may stick out.   You may feel short of breath because of your expanding uterus.   You may notice the fetus "dropping," or moving lower in your abdomen.   You may have a bloody mucus discharge. This usually occurs a few days to a week before labor begins.   Your cervix becomes thin and soft (effaced) near your due date.  WHAT TO EXPECT AT YOUR PRENATAL EXAMS   You will have prenatal exams every 2 weeks until week 36. Then, you will have weekly prenatal exams. During a routine prenatal visit:   You will be weighed to make sure you and the fetus are growing normally.   Your blood pressure is taken.   Your abdomen will be  measured to track your baby's growth.   The fetal heartbeat will be listened to.   Any test results from the previous visit will be discussed.   You may have a cervical check near your due date to see if you have effaced.  At around 36 weeks, your caregiver will check your cervix. At the same time, your caregiver will also perform a test on the secretions of the vaginal tissue. This test is to determine if a type of bacteria, Group B streptococcus, is present. Your caregiver will explain this further.  Your caregiver may ask you:   What your birth plan is.   How you are feeling.   If you are feeling the baby move.   If you have had any abnormal symptoms, such as leaking fluid, bleeding, severe headaches, or abdominal cramping.   If you have any questions.  Other tests or screenings that may be performed during your third trimester include:   Blood tests that check for low iron levels (anemia).   Fetal testing to check the health, activity level, and growth of the fetus. Testing is done if you have certain medical conditions or if there are problems during the pregnancy.  FALSE LABOR  You may feel small, irregular contractions that eventually go away. These are called Braxton Hicks contractions, or   false labor. Contractions may last for hours, days, or even weeks before true labor sets in. If contractions come at regular intervals, intensify, or become painful, it is best to be seen by your caregiver.   SIGNS OF LABOR    Menstrual-like cramps.   Contractions that are 5 minutes apart or less.   Contractions that start on the top of the uterus and spread down to the lower abdomen and back.   A sense of increased pelvic pressure or back pain.   A watery or bloody mucus discharge that comes from the vagina.  If you have any of these signs before the 37th week of pregnancy, call your caregiver right away. You need to go to the hospital to get checked immediately.  HOME CARE INSTRUCTIONS    Avoid all  smoking, herbs, alcohol, and unprescribed drugs. These chemicals affect the formation and growth of the baby.   Follow your caregiver's instructions regarding medicine use. There are medicines that are either safe or unsafe to take during pregnancy.   Exercise only as directed by your caregiver. Experiencing uterine cramps is a good sign to stop exercising.   Continue to eat regular, healthy meals.   Wear a good support bra for breast tenderness.   Do not use hot tubs, steam rooms, or saunas.   Wear your seat belt at all times when driving.   Avoid raw meat, uncooked cheese, cat litter boxes, and soil used by cats. These carry germs that can cause birth defects in the baby.   Take your prenatal vitamins.   Try taking a stool softener (if your caregiver approves) if you develop constipation. Eat more high-fiber foods, such as fresh vegetables or fruit and whole grains. Drink plenty of fluids to keep your urine clear or pale yellow.   Take warm sitz baths to soothe any pain or discomfort caused by hemorrhoids. Use hemorrhoid cream if your caregiver approves.   If you develop varicose veins, wear support hose. Elevate your feet for 15 minutes, 3 4 times a day. Limit salt in your diet.   Avoid heavy lifting, wear low heal shoes, and practice good posture.   Rest a lot with your legs elevated if you have leg cramps or low back pain.   Visit your dentist if you have not gone during your pregnancy. Use a soft toothbrush to brush your teeth and be gentle when you floss.   A sexual relationship may be continued unless your caregiver directs you otherwise.   Do not travel far distances unless it is absolutely necessary and only with the approval of your caregiver.   Take prenatal classes to understand, practice, and ask questions about the labor and delivery.   Make a trial run to the hospital.   Pack your hospital bag.   Prepare the baby's nursery.   Continue to go to all your prenatal visits as directed  by your caregiver.  SEEK MEDICAL CARE IF:   You are unsure if you are in labor or if your water has broken.   You have dizziness.   You have mild pelvic cramps, pelvic pressure, or nagging pain in your abdominal area.   You have persistent nausea, vomiting, or diarrhea.   You have a bad smelling vaginal discharge.   You have pain with urination.  SEEK IMMEDIATE MEDICAL CARE IF:    You have a fever.   You are leaking fluid from your vagina.   You have spotting or bleeding from your vagina.     You have severe abdominal cramping or pain.   You have rapid weight loss or gain.   You have shortness of breath with chest pain.   You notice sudden or extreme swelling of your face, hands, ankles, feet, or legs.   You have not felt your baby move in over an hour.   You have severe headaches that do not go away with medicine.   You have vision changes.  Document Released: 04/30/2001 Document Revised: 01/06/2013 Document Reviewed: 07/07/2012  [employer] Patient Information 2014 [employer].

## 2013-05-07 NOTE — Progress Notes (Signed)
S: 27 yo G4P3003 @ [redacted]w[redacted]d here for ROBV - no complaints - no ctx, lof, vb. +FM - still smoking THC but down to 3-4 joints daily - having heartburn.   O: see flowsheet  A/P - 28 week labs today - discussed quiting THC - UDS collected - zantac for heartburn.

## 2013-05-08 LAB — RPR

## 2013-05-08 LAB — DRUG SCREEN URINE W/ALC, NO CONF
Amphetamine Screen, Ur: NEGATIVE
Benzodiazepines.: NEGATIVE
Cocaine Metabolites: NEGATIVE
Marijuana Metabolite: POSITIVE — AB
Phencyclidine (PCP): NEGATIVE

## 2013-05-20 NOTE — L&D Delivery Note (Signed)
Delivery Note At 9:49 PM a viable female was delivered via Vaginal, Spontaneous Delivery (Presentation: ; Occiput Posterior).  APGAR: 8, 9; weight: pending.  Cord around the neck x 1- reduced prior to del. Placenta status: Intact, Spontaneous.  Cord: 3 vessels with the following complications: None.   Anesthesia: Epidural  Episiotomy: None Lacerations: None Suture Repair: none needed Est. Blood Loss (mL): 200  Mom to postpartum.  Baby to Couplet care / Skin to Skin.  [name], Eric 07/16/2013, 10:00 PM  I have seen and examined this patient and I agree with the above. Shortly after delivery we were called back to the room for eval of bldg. Uterus was sl boggy but firmed with massage; cytotec 800mcg inserted PR and bldg slowed shortly thereafter. Total EBL now 400ml. Cam HaiSHAW, Percival Glasheen 1:45 AM 07/17/2013

## 2013-06-07 ENCOUNTER — Encounter: Payer: Self-pay | Admitting: Family Medicine

## 2013-06-08 ENCOUNTER — Inpatient Hospital Stay (HOSPITAL_COMMUNITY)
Admission: AD | Admit: 2013-06-08 | Discharge: 2013-06-08 | Disposition: A | Discharge status: Home / Self Care | Payer: Medicaid Other | Source: Ambulatory Visit | Attending: Obstetrics & Gynecology | Admitting: Obstetrics & Gynecology

## 2013-06-08 ENCOUNTER — Encounter (HOSPITAL_COMMUNITY): Payer: Self-pay | Admitting: *Deleted

## 2013-06-08 DIAGNOSIS — O99330 Smoking (tobacco) complicating pregnancy, unspecified trimester: Secondary | ICD-10-CM | POA: Insufficient documentation

## 2013-06-08 DIAGNOSIS — O4700 False labor before 37 completed weeks of gestation, unspecified trimester: Secondary | ICD-10-CM | POA: Insufficient documentation

## 2013-06-08 DIAGNOSIS — N898 Other specified noninflammatory disorders of vagina: Secondary | ICD-10-CM | POA: Insufficient documentation

## 2013-06-08 DIAGNOSIS — O479 False labor, unspecified: Secondary | ICD-10-CM

## 2013-06-08 HISTORY — DX: Headache: R51

## 2013-06-08 LAB — URINALYSIS, ROUTINE W REFLEX MICROSCOPIC
Bilirubin Urine: NEGATIVE
Glucose, UA: NEGATIVE mg/dL
HGB URINE DIPSTICK: NEGATIVE
KETONES UR: NEGATIVE mg/dL
Nitrite: NEGATIVE
PH: 6.5 (ref 5.0–8.0)
Protein, ur: NEGATIVE mg/dL
Specific Gravity, Urine: 1.015 (ref 1.005–1.030)
Urobilinogen, UA: 0.2 mg/dL (ref 0.0–1.0)

## 2013-06-08 LAB — WET PREP, GENITAL
Clue Cells Wet Prep HPF POC: NONE SEEN
Trich, Wet Prep: NONE SEEN
Yeast Wet Prep HPF POC: NONE SEEN

## 2013-06-08 LAB — URINE MICROSCOPIC-ADD ON

## 2013-06-08 LAB — FETAL FIBRONECTIN: Fetal Fibronectin: NEGATIVE

## 2013-06-08 NOTE — MAU Provider Note (Signed)
None     Chief Complaint:  Labor Eval and Vaginal Discharge   Erika Porter is  28 y.o. [id number] at 2025w4d presents complaining of Labor Eval and Vaginal Discharge .  She states irregular, every 10-30 minutes contractions are associated with none vaginal bleeding, intact membranes, along with active fetal movement. reporst some discharge but otherwise no concerns. [facility]   Obstetrical/Gynecological History: OB History   Grav Para Term Preterm Abortions TAB SAB Ect Mult Living   4 3 3  0 0 0 0 0 0 3     Past Medical History: Past Medical History  Diagnosis Date  . Chlamydia   . Trichomonas   . BV (bacterial vaginosis)   . Anemia   . AVWUJWJX(914.7Headache(784.0)     Past Surgical History: Past Surgical History  Procedure Laterality Date  . No past surgeries      Family History: Family History  Problem Relation Age of Onset  . Hypertension Mother   . Anxiety disorder Mother   . Asthma Sister   . Asthma Daughter   . Asthma Son   . Cancer Maternal Aunt     Social History: History  Substance Use Topics  . Smoking status: Current Every Day Smoker -- 0.10 packs/day    Types: Cigarettes  . Smokeless tobacco: Never Used     Comment: quit with preg  . Alcohol Use: Yes     Comment: not with preg    Allergies: No Known Allergies  Meds:  Prescriptions prior to admission  Medication Sig Dispense Refill  . acetaminophen (TYLENOL) 500 MG tablet Take 1,000 mg by mouth daily as needed for headache.      . Prenatal Vit-Fe Fumarate-FA (PRENATAL MULTIVITAMIN) TABS tablet Take 1 tablet by mouth daily at 12 noon.      . ranitidine (ZANTAC) 150 MG tablet Take 1 tablet (150 mg total) by mouth 2 (two) times daily.  60 tablet  3    Review of Systems -   Review of Systems  Minimal ha, no vision changes, recent URI, mild cough now. No other acute issues.   Physical Exam  Blood pressure 120/79, pulse 73, temperature 98.9 F (37.2 C), temperature source Oral,  resp. rate 18, height 5\' 6"  (1.676 m), weight 65.499 kg (144 lb 6.4 oz), last menstrual period 10/23/2012. GENERAL: Well-developed, well-nourished female in no acute distress.  LUNGS: Clear to auscultation bilaterally.  HEART: Regular rate and rhythm. ABDOMEN: Soft, nontender, nondistended, gravid.  WGN:FAOZHSSE:White discharge. No acute issues, no blood.  Dilation: Closed (Ext os 1.5cm) Effacement (%): Thick Cervical Position: Posterior Station: Ballotable Presentation: Undeterminable Exam by:: Dr. Virl CageyFeeney/S. Gabriel Earingarrera, RNC FHT:  Baseline rate 130 bpm   Variability moderate  Accelerations present   Decelerations none Contractions: Every 10-20irreg mins   Labs: Results for orders placed during the hospital encounter of 06/08/13 (from the past 24 hour(s))  URINALYSIS, ROUTINE W REFLEX MICROSCOPIC   Collection Time    06/08/13  9:52 AM      Result Value Range   Color, Urine YELLOW  YELLOW   APPearance CLEAR  CLEAR   Specific Gravity, Urine 1.015  1.005 - 1.030   pH 6.5  5.0 - 8.0   Glucose, UA NEGATIVE  NEGATIVE mg/dL   Hgb urine dipstick NEGATIVE  NEGATIVE   Bilirubin Urine NEGATIVE  NEGATIVE   Ketones, ur NEGATIVE  NEGATIVE mg/dL   Protein, ur NEGATIVE  NEGATIVE mg/dL   Urobilinogen, UA 0.2  0.0 -  1.0 mg/dL   Nitrite NEGATIVE  NEGATIVE   Leukocytes, UA MODERATE (*) NEGATIVE  URINE MICROSCOPIC-ADD ON   Collection Time    06/08/13  9:52 AM      Result Value Range   Squamous Epithelial / LPF FEW (*) RARE   WBC, UA 3-6  <3 WBC/hpf   RBC / HPF 0-2  <3 RBC/hpf   Bacteria, UA FEW (*) RARE   Imaging Studies:  No results found.  FFN Neg Wet mountneg  Assessment: Erika Porter is  28 y.o. [phone] at [redacted]w[redacted]d presents with neg work up and irregular ctx. Reassured and dischared. Reviewed PTL precaution and fetus well appearing Erika Porter 1/20/201512:27 PM

## 2013-06-08 NOTE — Discharge Instructions (Signed)
Third Trimester of Pregnancy  The third trimester is from week 29 through week 42, months 7 through 9. The third trimester is a time when the fetus is growing rapidly. At the end of the ninth month, the fetus is about 20 inches in length and weighs 6 10 pounds.   BODY CHANGES  Your body goes through many changes during pregnancy. The changes vary from woman to woman.    Your weight will continue to increase. You can expect to gain 25 35 pounds (11 16 kg) by the end of the pregnancy.   You may begin to get stretch marks on your hips, abdomen, and breasts.   You may urinate more often because the fetus is moving lower into your pelvis and pressing on your bladder.   You may develop or continue to have heartburn as a result of your pregnancy.   You may develop constipation because certain hormones are causing the muscles that push waste through your intestines to slow down.   You may develop hemorrhoids or swollen, bulging veins (varicose veins).   You may have pelvic pain because of the weight gain and pregnancy hormones relaxing your joints between the bones in your pelvis. Back aches may result from over exertion of the muscles supporting your posture.   Your breasts will continue to grow and be tender. A yellow discharge may leak from your breasts called colostrum.   Your belly button may stick out.   You may feel short of breath because of your expanding uterus.   You may notice the fetus "dropping," or moving lower in your abdomen.   You may have a bloody mucus discharge. This usually occurs a few days to a week before labor begins.   Your cervix becomes thin and soft (effaced) near your due date.  WHAT TO EXPECT AT YOUR PRENATAL EXAMS   You will have prenatal exams every 2 weeks until week 36. Then, you will have weekly prenatal exams. During a routine prenatal visit:   You will be weighed to make sure you and the fetus are growing normally.   Your blood pressure is taken.   Your abdomen will be  measured to track your baby's growth.   The fetal heartbeat will be listened to.   Any test results from the previous visit will be discussed.   You may have a cervical check near your due date to see if you have effaced.  At around 36 weeks, your caregiver will check your cervix. At the same time, your caregiver will also perform a test on the secretions of the vaginal tissue. This test is to determine if a type of bacteria, Group B streptococcus, is present. Your caregiver will explain this further.  Your caregiver may ask you:   What your birth plan is.   How you are feeling.   If you are feeling the baby move.   If you have had any abnormal symptoms, such as leaking fluid, bleeding, severe headaches, or abdominal cramping.   If you have any questions.  Other tests or screenings that may be performed during your third trimester include:   Blood tests that check for low iron levels (anemia).   Fetal testing to check the health, activity level, and growth of the fetus. Testing is done if you have certain medical conditions or if there are problems during the pregnancy.  FALSE LABOR  You may feel small, irregular contractions that eventually go away. These are called Braxton Hicks contractions, or   false labor. Contractions may last for hours, days, or even weeks before true labor sets in. If contractions come at regular intervals, intensify, or become painful, it is best to be seen by your caregiver.   SIGNS OF LABOR    Menstrual-like cramps.   Contractions that are 5 minutes apart or less.   Contractions that start on the top of the uterus and spread down to the lower abdomen and back.   A sense of increased pelvic pressure or back pain.   A watery or bloody mucus discharge that comes from the vagina.  If you have any of these signs before the 37th week of pregnancy, call your caregiver right away. You need to go to the hospital to get checked immediately.  HOME CARE INSTRUCTIONS    Avoid all  smoking, herbs, alcohol, and unprescribed drugs. These chemicals affect the formation and growth of the baby.   Follow your caregiver's instructions regarding medicine use. There are medicines that are either safe or unsafe to take during pregnancy.   Exercise only as directed by your caregiver. Experiencing uterine cramps is a good sign to stop exercising.   Continue to eat regular, healthy meals.   Wear a good support bra for breast tenderness.   Do not use hot tubs, steam rooms, or saunas.   Wear your seat belt at all times when driving.   Avoid raw meat, uncooked cheese, cat litter boxes, and soil used by cats. These carry germs that can cause birth defects in the baby.   Take your prenatal vitamins.   Try taking a stool softener (if your caregiver approves) if you develop constipation. Eat more high-fiber foods, such as fresh vegetables or fruit and whole grains. Drink plenty of fluids to keep your urine clear or pale yellow.   Take warm sitz baths to soothe any pain or discomfort caused by hemorrhoids. Use hemorrhoid cream if your caregiver approves.   If you develop varicose veins, wear support hose. Elevate your feet for 15 minutes, 3 4 times a day. Limit salt in your diet.   Avoid heavy lifting, wear low heal shoes, and practice good posture.   Rest a lot with your legs elevated if you have leg cramps or low back pain.   Visit your dentist if you have not gone during your pregnancy. Use a soft toothbrush to brush your teeth and be gentle when you floss.   A sexual relationship may be continued unless your caregiver directs you otherwise.   Do not travel far distances unless it is absolutely necessary and only with the approval of your caregiver.   Take prenatal classes to understand, practice, and ask questions about the labor and delivery.   Make a trial run to the hospital.   Pack your hospital bag.   Prepare the baby's nursery.   Continue to go to all your prenatal visits as directed  by your caregiver.  SEEK MEDICAL CARE IF:   You are unsure if you are in labor or if your water has broken.   You have dizziness.   You have mild pelvic cramps, pelvic pressure, or nagging pain in your abdominal area.   You have persistent nausea, vomiting, or diarrhea.   You have a bad smelling vaginal discharge.   You have pain with urination.  SEEK IMMEDIATE MEDICAL CARE IF:    You have a fever.   You are leaking fluid from your vagina.   You have spotting or bleeding from your vagina.     You have severe abdominal cramping or pain.   You have rapid weight loss or gain.   You have shortness of breath with chest pain.   You notice sudden or extreme swelling of your face, hands, ankles, feet, or legs.   You have not felt your baby move in over an hour.   You have severe headaches that do not go away with medicine.   You have vision changes.  Document Released: 04/30/2001 Document Revised: 01/06/2013 Document Reviewed: 07/07/2012  [employer] Patient Information 2014 [employer].

## 2013-06-08 NOTE — MAU Provider Note (Signed)
Attestation of Attending Supervision of Fellow: Evaluation and management procedures were performed by the Fellow under my supervision and collaboration.  I have reviewed the Fellow's note and chart, and I agree with the management and plan.    

## 2013-06-08 NOTE — MAU Note (Addendum)
Not wearing a pad. No fluid leakage noted. Patient states sometimes fluid "squirts out." States she does not think it is urine or her amniotic fluid. Thinks it may just be discharge. HA, "migraine" past couple of days. Relieved by Tylenol.

## 2013-06-08 NOTE — MAU Note (Signed)
  Patient states she has been having a lot of contractions since last night. States they are frequent but not painful. States she has had a lot of vaginal discharge.

## 2013-06-09 LAB — URINE CULTURE
COLONY COUNT: NO GROWTH
CULTURE: NO GROWTH

## 2013-06-18 ENCOUNTER — Telehealth: Payer: Self-pay | Admitting: Family Medicine

## 2013-06-18 NOTE — Telephone Encounter (Signed)
Refill request for prenatal vitamins

## 2013-06-21 MED ORDER — PRENATAL MULTIVITAMIN CH: 1.0000 | ORAL_TABLET | Freq: Every day | ORAL | Status: DC

## 2013-06-21 NOTE — Telephone Encounter (Signed)
Refill sent. Brodie Scovell M. Devery Murgia, M.D.  

## 2013-06-30 ENCOUNTER — Ambulatory Visit (INDEPENDENT_AMBULATORY_CARE_PROVIDER_SITE_OTHER): Payer: Medicaid Other | Admitting: Family Medicine

## 2013-06-30 ENCOUNTER — Encounter: Payer: Self-pay | Admitting: Family Medicine

## 2013-06-30 VITALS — Temp 98.3°F | Wt 144.0 lb

## 2013-06-30 DIAGNOSIS — Z3480 Encounter for supervision of other normal pregnancy, unspecified trimester: Secondary | ICD-10-CM

## 2013-06-30 NOTE — Patient Instructions (Signed)
Please come back after next Tuesday for your next appointment with Dr. Antony HasteSonnenburg.  Good luck, everything looks great!  Amber M. Hairford, M.D.

## 2013-06-30 NOTE — Progress Notes (Signed)
Patient is a 28 yo G4P3003 at 136w5d routine prenatal. No concerns. Good fetal movement. No vaginal bleeding, no gush of fluid. Denies pain or pressure. Went to the MAU on 06/08/13 for possible contractions, but everything was normal. Baby boy, name undecided  O: See Flowsheet  A/P: - Return after 3366w4d for GBS, GC/Ch - Labor precautions discussed - Will need CSW consult after delivery - F/u with Dr. Birdie SonsSonnenberg (delivering MD)

## 2013-07-09 ENCOUNTER — Encounter: Payer: Self-pay | Admitting: Family Medicine

## 2013-07-16 ENCOUNTER — Encounter (HOSPITAL_COMMUNITY): Payer: Medicaid Other | Admitting: Anesthesiology

## 2013-07-16 ENCOUNTER — Inpatient Hospital Stay (HOSPITAL_COMMUNITY)
Admission: AD | Admit: 2013-07-16 | Discharge: 2013-07-16 | Disposition: A | Discharge status: Home / Self Care | Payer: Medicaid Other | Source: Ambulatory Visit | Attending: Obstetrics & Gynecology | Admitting: Obstetrics & Gynecology

## 2013-07-16 ENCOUNTER — Inpatient Hospital Stay (HOSPITAL_COMMUNITY): Payer: Medicaid Other | Admitting: Anesthesiology

## 2013-07-16 ENCOUNTER — Encounter (HOSPITAL_COMMUNITY): Payer: Self-pay | Admitting: *Deleted

## 2013-07-16 ENCOUNTER — Inpatient Hospital Stay (HOSPITAL_COMMUNITY)
Admission: AD | Admit: 2013-07-16 | Discharge: 2013-07-18 | DRG: 775 | Disposition: A | Discharge status: Home / Self Care | Payer: Medicaid Other | Source: Ambulatory Visit | Attending: Obstetrics & Gynecology | Admitting: Obstetrics & Gynecology

## 2013-07-16 DIAGNOSIS — D649 Anemia, unspecified: Secondary | ICD-10-CM | POA: Diagnosis present

## 2013-07-16 DIAGNOSIS — O99334 Smoking (tobacco) complicating childbirth: Secondary | ICD-10-CM | POA: Diagnosis present

## 2013-07-16 DIAGNOSIS — F1210 Cannabis abuse, uncomplicated: Secondary | ICD-10-CM | POA: Diagnosis present

## 2013-07-16 DIAGNOSIS — O9902 Anemia complicating childbirth: Secondary | ICD-10-CM

## 2013-07-16 DIAGNOSIS — O479 False labor, unspecified: Secondary | ICD-10-CM | POA: Insufficient documentation

## 2013-07-16 DIAGNOSIS — Z3490 Encounter for supervision of normal pregnancy, unspecified, unspecified trimester: Secondary | ICD-10-CM

## 2013-07-16 DIAGNOSIS — O99344 Other mental disorders complicating childbirth: Secondary | ICD-10-CM

## 2013-07-16 DIAGNOSIS — IMO0001 Reserved for inherently not codable concepts without codable children: Secondary | ICD-10-CM

## 2013-07-16 LAB — CBC
HEMATOCRIT: 33.3 % — AB (ref 36.0–46.0)
HEMOGLOBIN: 11 g/dL — AB (ref 12.0–15.0)
MCH: 26.5 pg (ref 26.0–34.0)
MCHC: 33 g/dL (ref 30.0–36.0)
MCV: 80.2 fL (ref 78.0–100.0)
Platelets: 162 10*3/uL (ref 150–400)
RBC: 4.15 MIL/uL (ref 3.87–5.11)
RDW: 15 % (ref 11.5–15.5)
WBC: 9.1 10*3/uL (ref 4.0–10.5)

## 2013-07-16 LAB — GROUP B STREP BY PCR: GROUP B STREP BY PCR: NEGATIVE

## 2013-07-16 LAB — RAPID URINE DRUG SCREEN, HOSP PERFORMED
AMPHETAMINES: NOT DETECTED
Barbiturates: NOT DETECTED
Benzodiazepines: NOT DETECTED
Cocaine: POSITIVE — AB
OPIATES: NOT DETECTED
Tetrahydrocannabinol: NOT DETECTED

## 2013-07-16 LAB — RPR: RPR Ser Ql: NONREACTIVE

## 2013-07-16 LAB — ABO/RH: ABO/RH(D): B POS

## 2013-07-16 LAB — TYPE AND SCREEN
ABO/RH(D): B POS
ANTIBODY SCREEN: NEGATIVE

## 2013-07-16 MED ORDER — PHENYLEPHRINE 40 MCG/ML (10ML) SYRINGE FOR IV PUSH (FOR BLOOD PRESSURE SUPPORT)
PREFILLED_SYRINGE | INTRAVENOUS | Status: AC
  Filled 2013-07-16: qty 10

## 2013-07-16 MED ORDER — ONDANSETRON HCL 4 MG/2ML IJ SOLN: 4.0000 mg | Freq: Four times a day (QID) | INTRAMUSCULAR | Status: DC | PRN

## 2013-07-16 MED ORDER — LIDOCAINE HCL (PF) 1 % IJ SOLN
30.0000 mL | INTRAMUSCULAR | Status: AC | PRN
  Administered 2013-07-16 (×2): 5 mL via SUBCUTANEOUS

## 2013-07-16 MED ORDER — EPHEDRINE 5 MG/ML INJ
10.0000 mg | INTRAVENOUS | Status: DC | PRN
  Filled 2013-07-16: qty 2

## 2013-07-16 MED ORDER — PHENYLEPHRINE 40 MCG/ML (10ML) SYRINGE FOR IV PUSH (FOR BLOOD PRESSURE SUPPORT)
80.0000 ug | PREFILLED_SYRINGE | INTRAVENOUS | Status: DC | PRN
  Filled 2013-07-16: qty 2

## 2013-07-16 MED ORDER — PHENYLEPHRINE 40 MCG/ML (10ML) SYRINGE FOR IV PUSH (FOR BLOOD PRESSURE SUPPORT): 80.0000 ug | PREFILLED_SYRINGE | INTRAVENOUS | Status: DC | PRN

## 2013-07-16 MED ORDER — LACTATED RINGERS IV SOLN: 500.0000 mL | Freq: Once | INTRAVENOUS | Status: DC

## 2013-07-16 MED ORDER — ACETAMINOPHEN 325 MG PO TABS: 650.0000 mg | ORAL_TABLET | ORAL | Status: DC | PRN

## 2013-07-16 MED ORDER — EPHEDRINE 5 MG/ML INJ: 10.0000 mg | INTRAVENOUS | Status: DC | PRN

## 2013-07-16 MED ORDER — FENTANYL 2.5 MCG/ML BUPIVACAINE 1/10 % EPIDURAL INFUSION (WH - ANES)
INTRAMUSCULAR | Status: AC
  Filled 2013-07-16: qty 125

## 2013-07-16 MED ORDER — MISOPROSTOL 200 MCG PO TABS
800.0000 ug | ORAL_TABLET | Freq: Once | ORAL | Status: AC
  Administered 2013-07-16: 800 ug via RECTAL

## 2013-07-16 MED ORDER — MISOPROSTOL 200 MCG PO TABS
ORAL_TABLET | ORAL | Status: AC
  Filled 2013-07-16: qty 5

## 2013-07-16 MED ORDER — DIPHENHYDRAMINE HCL 50 MG/ML IJ SOLN
12.5000 mg | INTRAMUSCULAR | Status: DC | PRN
  Administered 2013-07-16: 12.5 mg via INTRAVENOUS
  Filled 2013-07-16: qty 1

## 2013-07-16 MED ORDER — EPHEDRINE 5 MG/ML INJ
INTRAVENOUS | Status: AC
  Filled 2013-07-16: qty 4

## 2013-07-16 MED ORDER — OXYCODONE-ACETAMINOPHEN 5-325 MG PO TABS: 1.0000 | ORAL_TABLET | ORAL | Status: DC | PRN

## 2013-07-16 MED ORDER — CITRIC ACID-SODIUM CITRATE 334-500 MG/5ML PO SOLN: 30.0000 mL | ORAL | Status: DC | PRN

## 2013-07-16 MED ORDER — LIDOCAINE HCL (PF) 1 % IJ SOLN
30.0000 mL | INTRAMUSCULAR | Status: DC | PRN
  Filled 2013-07-16: qty 30

## 2013-07-16 MED ORDER — FENTANYL 2.5 MCG/ML BUPIVACAINE 1/10 % EPIDURAL INFUSION (WH - ANES): 14.0000 mL/h | INTRAMUSCULAR | Status: DC | PRN

## 2013-07-16 MED ORDER — OXYTOCIN BOLUS FROM INFUSION
500.0000 mL | INTRAVENOUS | Status: DC
Start: 2013-07-16 — End: 2013-07-17
  Administered 2013-07-16: 500 mL via INTRAVENOUS

## 2013-07-16 MED ORDER — LACTATED RINGERS IV SOLN: INTRAVENOUS | Status: DC

## 2013-07-16 MED ORDER — LACTATED RINGERS IV SOLN: 500.0000 mL | INTRAVENOUS | Status: DC | PRN

## 2013-07-16 MED ORDER — LACTATED RINGERS IV SOLN
INTRAVENOUS | Status: DC
  Administered 2013-07-16: 18:00:00 via INTRAVENOUS

## 2013-07-16 MED ORDER — FENTANYL 2.5 MCG/ML BUPIVACAINE 1/10 % EPIDURAL INFUSION (WH - ANES)
14.0000 mL/h | INTRAMUSCULAR | Status: DC | PRN
  Administered 2013-07-16: 14 mL/h via EPIDURAL

## 2013-07-16 MED ORDER — FLEET ENEMA 7-19 GM/118ML RE ENEM: 1.0000 | ENEMA | RECTAL | Status: DC | PRN

## 2013-07-16 MED ORDER — OXYTOCIN 40 UNITS IN LACTATED RINGERS INFUSION - SIMPLE MED
62.5000 mL/h | INTRAVENOUS | Status: DC
  Filled 2013-07-16: qty 1000

## 2013-07-16 MED ORDER — IBUPROFEN 600 MG PO TABS
600.0000 mg | ORAL_TABLET | Freq: Four times a day (QID) | ORAL | Status: DC | PRN
  Administered 2013-07-17: 600 mg via ORAL
  Filled 2013-07-16: qty 1

## 2013-07-16 MED ORDER — DIPHENHYDRAMINE HCL 50 MG/ML IJ SOLN: 12.5000 mg | INTRAMUSCULAR | Status: DC | PRN

## 2013-07-16 NOTE — Anesthesia Procedure Notes (Signed)
Epidural Patient location during procedure: OB Start time: 07/16/2013 6:47 PM End time: 07/16/2013 6:55 PM  Staffing Anesthesiologist: Franke Menter, CHRIS Performed by: anesthesiologist   Preanesthetic Checklist Completed: patient identified, surgical consent, pre-op evaluation, timeout performed, IV checked, risks and benefits discussed and monitors and equipment checked  Epidural Patient position: sitting Prep: site prepped and draped and DuraPrep Patient monitoring: heart rate, cardiac monitor, continuous pulse ox and blood pressure Approach: midline Location: L3-L4 Injection technique: LOR saline  Needle:  Needle type: Tuohy  Needle gauge: 17 G Needle length: 9 cm Needle insertion depth: 4 cm Catheter type: closed end flexible Catheter size: 19 Gauge Catheter at skin depth: 11 cm Test dose: Other  Assessment Events: blood not aspirated, injection not painful, no injection resistance, negative IV test and no paresthesia  Additional Notes H+P and labs checked, risks and benefits discussed with the patient, consent obtained, procedure tolerated well and without complications.  Reason for block:procedure for pain

## 2013-07-16 NOTE — MAU Note (Signed)
C/o UC's since 0300. No bleeding or leaking. Feeling FM.

## 2013-07-16 NOTE — Progress Notes (Signed)
Erika Porter is a 28 y.o. [phone] at 2054w0d by LMP admitted for active labor  Subjective: Patient doing well. States contractions dont feel as strong after having the epidural placed.   Objective: BP 135/84  Pulse 96  Temp(Src) 98.3 F (36.8 C) (Oral)  Resp 20  Ht 5\' 6"  (1.676 Porter)  Wt 67.586 kg (149 lb)  BMI 24.06 kg/m2  SpO2 99%  LMP 10/23/2012      FHT:  FHR: 130 bpm, variability: moderate,  accelerations:  Present,  decelerations:  Absent UC:   irregular, every 2-4 minutes SVE:   Dilation: 5 Effacement (%): 90 Station: 0 Exam by:: Dr. Birdie SonsSonnenberg  Labs: Lab Results  Component Value Date   WBC 9.1 07/16/2013   HGB 11.0* 07/16/2013   HCT 33.3* 07/16/2013   MCV 80.2 07/16/2013   PLT 162 07/16/2013    Assessment / Plan: Spontaneous labor, progressing normally  Labor: Progressing normally Fetal Wellbeing:  Category I Pain Control:  Epidural I/D:  n/a Anticipated MOD:  NSVD  [name], [name] 07/16/2013, 7:30 PM

## 2013-07-16 NOTE — Anesthesia Preprocedure Evaluation (Signed)
Anesthesia Evaluation  Patient identified by MRN, date of birth, ID band Patient awake    Reviewed: Allergy & Precautions, H&P , NPO status , Patient's Chart, lab work & pertinent test results  History of Anesthesia Complications Negative for: history of anesthetic complications  Airway Mallampati: II  Neck ROM: Full    Dental  (+) Teeth Intact   Pulmonary neg shortness of breath, Current Smoker,  breath sounds clear to auscultation        Cardiovascular negative cardio ROS  Rhythm:Regular Rate:Normal     Neuro/Psych Anxiety negative neurological ROS     GI/Hepatic negative GI ROS, Neg liver ROS,   Endo/Other  negative endocrine ROS  Renal/GU negative Renal ROS  negative genitourinary   Musculoskeletal negative musculoskeletal ROS (+)   Abdominal   Peds  Hematology  (+) anemia ,   Anesthesia Other Findings   Reproductive/Obstetrics (+) Pregnancy                           Anesthesia Physical Anesthesia Plan  ASA: II  Anesthesia Plan: Epidural   Post-op Pain Management:    Induction:   Airway Management Planned: Natural Airway  Additional Equipment: None  Intra-op Plan:   Post-operative Plan:   Informed Consent: I have reviewed the patients History and Physical, chart, labs and discussed the procedure including the risks, benefits and alternatives for the proposed anesthesia with the patient or authorized representative who has indicated his/her understanding and acceptance.   Dental advisory given  Plan Discussed with: Anesthesiologist  Anesthesia Plan Comments:         Anesthesia Quick Evaluation

## 2013-07-16 NOTE — Discharge Instructions (Signed)
Keep your scheduled appointment for prenatal care. Drink 8-10 glasses of water per day. °

## 2013-07-16 NOTE — MAU Note (Signed)
Was seen in MAU earlier today. States UC's much harder.

## 2013-07-16 NOTE — MAU Note (Signed)
Patient states she is having contractions that are not very frequent but she is not sure how far apart. Denies bleeding or leaking and reports good fetal movement.

## 2013-07-16 NOTE — H&P (Signed)
Erika Porter is a 28 y.o. female [phone] with IUP at 3132w0d presenting for active labor. Pt states she has been having regular, every 3-5 minutes contractions, associated with none vaginal bleeding.  Membranes are intact, with active fetal movement.    [facility]  Prenatal History/Complications: THC during pregnancy. Hx of Cocaine ( NO positive UDS in this pregnancy)  Past Medical History: Past Medical History  Diagnosis Date  . Chlamydia   . Trichomonas   . BV (bacterial vaginosis)   . Anemia   . ZHYQMVHQ(469.6Headache(784.0)    Past Surgical History: Past Surgical History  Procedure Laterality Date  . No past surgeries      Obstetrical History: OB History   Grav Para Term Preterm Abortions TAB SAB Ect Mult Living   4 3 3  0 0 0 0 0 0 3     Social History: History   Social History  . Marital Status: Single    Spouse Name: N/A    Number of Children: N/A  . Years of Education: N/A   Social History Main Topics  . Smoking status: Current Every Day Smoker -- 0.10 packs/day    Types: Cigarettes  . Smokeless tobacco: Never Used     Comment: quit with preg  . Alcohol Use: Yes     Comment: not with preg  . Drug Use: Yes    Special: Marijuana     Comment: Last used yesterday.  Marland Kitchen. Sexual Activity: Yes     Comment: last sexual contact 3 weeks ago   Other Topics Concern  . None   Social History Narrative  . None    Family History: Family History  Problem Relation Age of Onset  . Hypertension Mother   . Anxiety disorder Mother   . Asthma Sister   . Asthma Daughter   . Asthma Son   . Cancer Maternal Aunt     Allergies: No Known Allergies  Prescriptions prior to admission  Medication Sig Dispense Refill  . acetaminophen (TYLENOL) 500 MG tablet Take 1,000 mg by mouth daily as needed for headache.      . clindamycin (CLEOCIN T) 1 % external solution Apply 1 application topically 2 (two) times daily.      . Prenatal Vit-Fe Fumarate-FA (PRENATAL MULTIVITAMIN)  TABS tablet Take 1 tablet by mouth daily at 12 noon.  90 tablet  3  . ranitidine (ZANTAC) 150 MG tablet Take 1 tablet (150 mg total) by mouth 2 (two) times daily.  60 tablet  3    Review of Systems: Negative unless otherwise stated in History above  Physicial Blood pressure 140/85, pulse 73, temperature 99.1 F (37.3 C), temperature source Oral, resp. rate 20, height 5\' 6"  (1.676 m), weight 67.586 kg (149 lb), last menstrual period 10/23/2012. General appearance: alert, cooperative and moderate distress Lungs: clear to auscultation bilaterally Heart: regular rate and rhythm Abdomen: soft, non-tender; bowel sounds normal Extremities: Homans sign is negative, no sign of DVT DTR's 2+ Fetal monitoringBaseline: 150 bpm, Variability: Good {> 6 bpm), Accelerations: Reactive and Decelerations: Absent Uterine activityFrequency: Every 5-8 minutes Dilation: 4 Effacement (%): 80 Station: -2;-1 Exam by:: Sarajane MarekS. Carrera, RNC/Dr. Gayla DossJoyner  Prenatal labs: ABO, Rh: B/POS/-- (08/11 1356) Antibody: NEG (08/11 1356) Rubella:   RPR: NON REAC (12/19 1549)  HBsAg: NEGATIVE (08/11 1356)  HIV: NON REACTIVE (12/19 1549)  GBS:   unknown GTT: 1hr - 96  Prenatal Transfer Tool  Maternal Diabetes: No Genetic Screening: Declined Maternal Ultrasounds/Referrals: Normal Fetal  Ultrasounds or other Referrals:  None Maternal Substance Abuse:  Yes:  Type: Marijuana Significant Maternal Medications:  None Significant Maternal Lab Results: Lab values include: Other: GBS unknown  No results found for this or any previous visit (from the past 24 hour(s)).  Assessment: Erika Porter is a 28 y.o. [phone] at [redacted]w[redacted]d by LMP here for active labor  #Labor: progressing normally, membrane intact; expectant management #Pain: Epidural on request #FWB: Cat 1 #ID: GBS unknown- send PCR #Feeding: Will discuss #MOC:Unknown  Wenda Low MD [facility] PGY-1 07/16/2013, 5:31 PM   I have seen and examined this patient and  agree with above documentation in the resident's note.   Rulon Abide, M.D. [facility] Fellow 07/16/2013 7:45 PM

## 2013-07-16 NOTE — Progress Notes (Signed)
Marcelle SmilingJessica M Stenner is a 28 y.o. [phone] at 7672w0d by LMP admitted for active labor  Subjective: Feels as though the baby has come down.   Objective: BP 119/94  Pulse 78  Temp(Src) 98.1 F (36.7 C) (Oral)  Resp 18  Ht 5\' 6"  (1.676 m)  Wt 67.586 kg (149 lb)  BMI 24.06 kg/m2  SpO2 78%  LMP 10/23/2012      FHT:  FHR: 135 bpm, variability: moderate,  accelerations:  Present,  decelerations:  Absent UC:   regular, every 2 minutes SVE:   Dilation: 10 Effacement (%): 100 Station: +2 Exam by:: Dr. Birdie SonsSonnenberg, J Lopez,RN  Labs: Lab Results  Component Value Date   WBC 9.1 07/16/2013   HGB 11.0* 07/16/2013   HCT 33.3* 07/16/2013   MCV 80.2 07/16/2013   PLT 162 07/16/2013    Assessment / Plan: Spontaneous labor, progressing normally  Labor: Progressing normally Fetal Wellbeing:  Category I Pain Control:  Epidural I/D:  n/a Anticipated MOD:  NSVD  [name], Eric 07/16/2013, 9:33 PM

## 2013-07-17 DIAGNOSIS — O9902 Anemia complicating childbirth: Secondary | ICD-10-CM | POA: Diagnosis not present

## 2013-07-17 DIAGNOSIS — O99334 Smoking (tobacco) complicating childbirth: Secondary | ICD-10-CM | POA: Diagnosis not present

## 2013-07-17 DIAGNOSIS — O99344 Other mental disorders complicating childbirth: Secondary | ICD-10-CM | POA: Diagnosis not present

## 2013-07-17 DIAGNOSIS — D649 Anemia, unspecified: Secondary | ICD-10-CM | POA: Diagnosis not present

## 2013-07-17 MED ORDER — ZOLPIDEM TARTRATE 5 MG PO TABS: 5.0000 mg | ORAL_TABLET | Freq: Every evening | ORAL | Status: DC | PRN

## 2013-07-17 MED ORDER — DIPHENHYDRAMINE HCL 25 MG PO CAPS: 25.0000 mg | ORAL_CAPSULE | Freq: Four times a day (QID) | ORAL | Status: DC | PRN

## 2013-07-17 MED ORDER — OXYCODONE-ACETAMINOPHEN 5-325 MG PO TABS
1.0000 | ORAL_TABLET | ORAL | Status: DC | PRN
  Administered 2013-07-17: 2 via ORAL
  Administered 2013-07-17 (×2): 1 via ORAL
  Administered 2013-07-17 – 2013-07-18 (×4): 2 via ORAL
  Filled 2013-07-17: qty 2
  Filled 2013-07-17: qty 1
  Filled 2013-07-17: qty 2
  Filled 2013-07-17: qty 1
  Filled 2013-07-17 (×3): qty 2

## 2013-07-17 MED ORDER — ONDANSETRON HCL 4 MG/2ML IJ SOLN: 4.0000 mg | INTRAMUSCULAR | Status: DC | PRN

## 2013-07-17 MED ORDER — BENZOCAINE-MENTHOL 20-0.5 % EX AERO: 1.0000 "application " | INHALATION_SPRAY | CUTANEOUS | Status: DC | PRN

## 2013-07-17 MED ORDER — IBUPROFEN 600 MG PO TABS
600.0000 mg | ORAL_TABLET | Freq: Four times a day (QID) | ORAL | Status: DC
  Administered 2013-07-17 – 2013-07-18 (×6): 600 mg via ORAL
  Filled 2013-07-17 (×6): qty 1

## 2013-07-17 MED ORDER — TETANUS-DIPHTH-ACELL PERTUSSIS 5-2.5-18.5 LF-MCG/0.5 IM SUSP
0.5000 mL | Freq: Once | INTRAMUSCULAR | Status: AC
  Administered 2013-07-17: 0.5 mL via INTRAMUSCULAR
  Filled 2013-07-17: qty 0.5

## 2013-07-17 MED ORDER — PRENATAL MULTIVITAMIN CH
1.0000 | ORAL_TABLET | Freq: Every day | ORAL | Status: DC
  Administered 2013-07-17 – 2013-07-18 (×2): 1 via ORAL
  Filled 2013-07-17 (×2): qty 1

## 2013-07-17 MED ORDER — SENNOSIDES-DOCUSATE SODIUM 8.6-50 MG PO TABS
2.0000 | ORAL_TABLET | ORAL | Status: DC
  Administered 2013-07-17 (×2): 2 via ORAL
  Filled 2013-07-17 (×2): qty 2

## 2013-07-17 MED ORDER — WITCH HAZEL-GLYCERIN EX PADS: 1.0000 "application " | MEDICATED_PAD | CUTANEOUS | Status: DC | PRN

## 2013-07-17 MED ORDER — LANOLIN HYDROUS EX OINT: TOPICAL_OINTMENT | CUTANEOUS | Status: DC | PRN

## 2013-07-17 MED ORDER — SIMETHICONE 80 MG PO CHEW: 80.0000 mg | CHEWABLE_TABLET | ORAL | Status: DC | PRN

## 2013-07-17 MED ORDER — DIBUCAINE 1 % RE OINT: 1.0000 "application " | TOPICAL_OINTMENT | RECTAL | Status: DC | PRN

## 2013-07-17 MED ORDER — ONDANSETRON HCL 4 MG PO TABS: 4.0000 mg | ORAL_TABLET | ORAL | Status: DC | PRN

## 2013-07-17 NOTE — Anesthesia Postprocedure Evaluation (Signed)
Anesthesia Post Note  Patient: Erika SmilingJessica M Dettmann  Procedure(s) Performed: * No procedures listed *  Anesthesia type: Epidural  Patient location: Mother/Baby  Post pain: Pain level controlled  Post assessment: Post-op Vital signs reviewed  Last Vitals:  Filed Vitals:   07/17/13 0510  BP: 112/69  Pulse: 55  Temp: 36.9 C  Resp: 18    Post vital signs: Reviewed  Level of consciousness:alert  Complications: No apparent anesthesia complications

## 2013-07-17 NOTE — Progress Notes (Signed)
Post Partum Day 1 Subjective: no complaints, up ad lib, voiding and tolerating PO  Objective: Blood pressure 112/69, pulse 55, temperature 98.4 F (36.9 C), temperature source Oral, resp. rate 18, height 5\' 6"  (1.676 m), weight 67.586 kg (149 lb), last menstrual period 10/23/2012, SpO2 78.00%, unknown if currently breastfeeding.  Physical Exam:  General: alert and cooperative Lochia: appropriate Uterine Fundus: firm DVT Evaluation: No evidence of DVT seen on physical exam.   Recent Labs  07/16/13 1740  HGB 11.0*  HCT 33.3*    Assessment/Plan: Plan for discharge tomorrow, Social Work consult and Contraception depo CSW for positive UDS for cocaine   LOS: 1 day   [name], Jermain Curt 07/17/2013, 10:00 AM

## 2013-07-17 NOTE — Progress Notes (Signed)
Clinical Social Work Department PSYCHOSOCIAL ASSESSMENT - MATERNAL/CHILD 07/17/2013  Patient:  Erika Porter,Erika Porter  Account Number:  [id number]  Admit Date:  07/16/2013  Childs Name:   Erika Porter    Clinical Social Worker:  Erika Hsia, LCSW   Date/Time:  07/17/2013 10:30 AM  Date Referred:  07/17/2013   Referral source  CN  Physician     Referred reason  Substance Abuse   Other referral source:    I:  FAMILY / HOME ENVIRONMENT Child's legal guardian:  PARENT  Guardian - Name Guardian - Age Guardian - Address  Erika Porter [address], [place], [place] [address]   Other household support members/support persons Name Relationship DOB   SON 12   SON 9   DAUGHTER 4   Other support:   MOB states her mother, aunt and 2 sisters are her greatest support people.  Her best friend and best friend's sister were here visiting with her this morning.    II  PSYCHOSOCIAL DATA Information Source:  Patient Interview  Financial and Community Resources Employment:   MOB is not currently working, but states a desire to find a job as soon as possible.   Financial resources:  Medicaid If Medicaid - County:  [place]  School / Grade:   Maternity Care Coordinator / Child Services Coordination / Early Interventions:   [facility]  Cultural issues impacting care:   None stated.    III  STRENGTHS Strengths  Adequate Resources  Compliance with medical plan  Home prepared for Child (including basic supplies)  Supportive family/friends   Strength comment:    IV  RISK FACTORS AND CURRENT PROBLEMS Current Problem:  YES   Risk Factor & Current Problem Patient Issue Family Issue Risk Factor / Current Problem Comment  Substance Abuse Y N MOB-hx of Cocaine and Marijuana.  MOB's UDS positive for Cocaine on admission.         V  SOCIAL WORK ASSESSMENT  CSW met with MOB in her first floor room/116 to complete assessment for hx of substance abuse.  MOB had a positive UDS for Cocaine on  admission.  MOB was very pleasant, talkative and welcoming of CSW.  She asked that her visitors leave when CSW arrived so she could talk with CSW privately.  MOB held baby while we talked.  She was aware of her positive drug screen and spoke openly with CSW about her drug use.  She reports her first use of Cocaine was when she was 18.  She states she has never used "regularly," but rather occassionally, when she "goes clubbing."  She admits to marijuana use early in pregnancy to help with nausea, but states she stopped using a few months ago.  She reports she last used Cocaine a couple days ago.  She did not think it would show up on a drug screen by the time she had the baby because she was not due until 07/30/13.  She states that Cocaine use does not affect her ability to be a productive individual and parent.  CSW is glad she feels this way, but asked her how she could make Cocaine not a part of her life any longer.  MOB states this is it and she no longer plans to use drugs.  CSW asked if she has ever been in substance abuse treatment or if she would be willing to go to treatment.  She states she has not and denies the need for treatment at this time.  She states she   really wants to get a job and understands that she will not be able to get employed if she has positive drug screens.  She also understands that she must be clean having CPS involved in her life.  MOB reports having her own apartment, everything she needs for baby at home, custody of her other children (although admits to CPS involvement in the distant past), and a good support system.  Her children are currently staying with her mother while she is in the hospital.  MOB states the fathers of her children are not supportive.  She is unsure who the father of this baby is, as there are two possibilities.  CSW explained hospital drug screen policy and that CSW would be contacting Child Protective Services due to her drug use during pregnancy.  MOB was  understanding and states she knew CPS would be involved.  CSW thanked MOB for being open with CSW and listed this as a strength when making the CPS report.  CSW feels MOB has other strengths as well, as listed above, and reported these as well.  MOB denies any hx of PPD, but was engaged in the conversation about signs and symptoms and commits to talking with her doctor if she has concerns about her emotions at any time.  CSW provided MOB with CSW's phone number and asked her to call at any time with questions or needs.  MOB was pleasant and thanked CSW.  CPS oncall worker/K. Parham took the report and states she will try to come talk with MOB at the hospital today or tomorrow morning.  CSW will continue to follow to ensure plans are made for infants safe discharge.   VI SOCIAL WORK PLAN Social Work Plan  Child Protective Services Report  Patient/Family Education   Type of pt/family education:   Hospital drug screen policy  PPD signs and symptoms   If child protective services report - county:  [place] If child protective services report - date:  07/17/2013 Information/referral to community resources comment:   CSW will make CC4C referral on Monday   Other social work plan:   CSW will monitor MDS results.   

## 2013-07-17 NOTE — Progress Notes (Signed)
I spoke with and examined patient and agree with resident/PA/SNM's note and plan of care. Bottlefeeding. Desires op circumcision- info given. SW consult for +cocaine still pending.   Cheral MarkerKimberly R. Cherokee Boccio, CNM, [facility]NP-BC 07/17/2013 2:04 PM

## 2013-07-18 MED ORDER — IBUPROFEN 600 MG PO TABS: 600.0000 mg | ORAL_TABLET | Freq: Four times a day (QID) | ORAL | Status: DC | PRN

## 2013-07-18 NOTE — Discharge Summary (Signed)
Obstetric Discharge Summary Reason for Admission: onset of labor Prenatal Procedures: ultrasound Intrapartum Procedures: spontaneous vaginal delivery Postpartum Procedures: none Complications-Operative and Postpartum: 800mcg cytotec pr for brisk lochia/boggy fundus immediately pp. Total EBL 400 Eating, drinking, voiding, ambulating well.  +flatus.  Lochia and pain wnl.  Denies dizziness, lightheadedness, or sob. No complaints.  Hemoglobin  Date Value Ref Range Status  07/16/2013 11.0* 12.0 - 15.0 g/dL Final     HCT  Date Value Ref Range Status  07/16/2013 33.3* 36.0 - 46.0 % Final    Physical Exam:  General: alert, cooperative and no distress Lochia: appropriate Uterine Fundus: firm Incision: n/a DVT Evaluation: No evidence of DVT seen on physical exam. Negative Homan's sign. No cords or calf tenderness. No significant calf/ankle edema.  Discharge Diagnoses: Term Pregnancy-delivered, +cocaine on uds on admission  Discharge Information: Date: 07/18/2013 Activity: pelvic rest Diet: routine Medications: PNV and Ibuprofen Condition: stable Instructions: refer to practice specific booklet Discharge to: room-in status until CPS visit Follow-up Information   Schedule an appointment as soon as possible for a visit with [name], Eric, MD. (in 4-6 weeks for your postpartum visit)    Specialty:  Family Medicine   Contact information:   [address] [place] [address] [phone]       Newborn Data: Live born female  Birth Weight: 6 lb 10 oz (3005 g) APGAR: 8, 9  Had SW consult yesterday, awaiting CPS consult before infant d/c'd Mom can room-in w/ infant until CPS consult Bottlefeeding Plans for depo Desires outpatient circumcision, info given  Erika Porter, Erika Porter 07/18/2013, 7:11 AM

## 2013-07-18 NOTE — Progress Notes (Signed)
Spoke with on-call CPS worker Thomasenia Bottomsharles Keys and informed that the case will not be investigated at this time, because newborn's UDS was negative.  CSW to refer case again if the meconium drug screen is positive.

## 2013-07-18 NOTE — Discharge Instructions (Signed)

## 2013-07-19 NOTE — Progress Notes (Signed)
Post discharge chart review completed.  

## 2013-07-19 NOTE — Discharge Summary (Signed)
`````  Attestation of Attending Supervision of Advanced Practitioner: Evaluation and management procedures were performed by the PA/NP/CNM/OB Fellow under my supervision/collaboration. Chart reviewed and agree with management and plan.  [place] Lebeau V 07/19/2013 6:42 PM

## 2013-07-20 ENCOUNTER — Encounter: Payer: Self-pay | Admitting: Family Medicine

## 2013-07-20 ENCOUNTER — Ambulatory Visit (INDEPENDENT_AMBULATORY_CARE_PROVIDER_SITE_OTHER): Payer: Medicaid Other | Admitting: Family Medicine

## 2013-07-20 DIAGNOSIS — Z309 Encounter for contraceptive management, unspecified: Secondary | ICD-10-CM

## 2013-07-20 LAB — POCT HEMOGLOBIN: Hemoglobin: 10.7 g/dL — AB (ref 12.2–16.2)

## 2013-07-20 MED ORDER — MEDROXYPROGESTERONE ACETATE 150 MG/ML IM SUSP
150.0000 mg | Freq: Once | INTRAMUSCULAR | Status: AC
  Administered 2013-07-20: 150 mg via INTRAMUSCULAR

## 2013-07-20 NOTE — Progress Notes (Signed)
Erika Porter is a 28 y.o. female who [facility] for passed blood clot  Passed blood clot on Sunday vaginally. Very large per pt and even clogged the toilet (though stool was also in toilet). Slight trickle vaginally per pt. Going through 2-3 day pads a day regardless of how full they are. Formula feeding pt. Has felt lightheaded since having son, but not getting worse. Deneis palpitations, syncope, CP, HA. Endorses dizzy spells even prior to having baby.   Pt not sexually active but desiring contraception. Discussed options   The following portions of the patient's history were reviewed and updated as appropriate: allergies, current medications, past medical history, family and social history, and problem list.  Patient is a smoker  Past Medical History  Diagnosis Date  . Chlamydia   . Trichomonas   . BV (bacterial vaginosis)   . Anemia   . Headache(784.0)     ROS as above otherwise neg.    Medications reviewed. Current Outpatient Prescriptions  Medication Sig Dispense Refill  . acetaminophen (TYLENOL) 500 MG tablet Take 1,000 mg by mouth daily as needed for headache.      . clindamycin (CLEOCIN T) 1 % external solution Apply 1 application topically 2 (two) times daily.      Marland Kitchen. ibuprofen (ADVIL,MOTRIN) 600 MG tablet Take 1 tablet (600 mg total) by mouth every 6 (six) hours as needed for mild pain, moderate pain or cramping.  30 tablet  0  . Prenatal Vit-Fe Fumarate-FA (PRENATAL MULTIVITAMIN) TABS tablet Take 1 tablet by mouth daily at 12 noon.  90 tablet  3  . ranitidine (ZANTAC) 150 MG tablet Take 1 tablet (150 mg total) by mouth 2 (two) times daily.  60 tablet  3   No current facility-administered medications for this visit.    Exam: BP 118/70  Pulse 97  Temp(Src) 98.4 F (36.9 C) (Oral)  Ht 5\' 6"  (1.676 m)  Wt 137 lb (62.143 kg)  BMI 22.12 kg/m2  LMP 10/23/2012 Gen: Well NAD HEENT: EOMI,  MMM Uterus: firm, non-boggy and well below the umbilicus   Results for  orders placed in visit on 07/20/13 (from the past 72 hour(s))  POCT HEMOGLOBIN     Status: Abnormal   Collection Time    07/20/13 10:14 AM      Result Value Ref Range   Hemoglobin 10.7 (*) 12.2 - 16.2 g/dL    A/P (as seen in Problem list)  Contraception management Discussed different BC methods  Depo today (safe for postpartum less than 6 wks)  Postpartum bleeding Pt description of bleeding is nml.  H&H today in office is nml.  Discussed appropriate postpartum bleeding - reassurance.    Cold symptoms unchanged for past week. Continue OTC measures

## 2013-07-20 NOTE — Patient Instructions (Signed)
You are doing well overall The amount of bleeding you are describing is normal Your blood counts were normal.  Please come back for your 6 week post partum appointment with Dr. Mikel CellaHairford

## 2013-07-20 NOTE — Addendum Note (Signed)
Addended by: Jone BasemanFLEEGER, Allexis D on: 07/20/2013 11:06 AM   Modules accepted: Orders

## 2013-07-20 NOTE — Assessment & Plan Note (Signed)
Pt description of bleeding is nml.  H&H today in office is nml.  Discussed appropriate postpartum bleeding - reassurance.

## 2013-07-20 NOTE — Assessment & Plan Note (Addendum)
Discussed different BC methods  Depo today (safe for postpartum less than 6 wks)

## 2013-08-31 ENCOUNTER — Ambulatory Visit (INDEPENDENT_AMBULATORY_CARE_PROVIDER_SITE_OTHER): Payer: Medicaid Other | Admitting: Family Medicine

## 2013-08-31 NOTE — Patient Instructions (Signed)
Make an appointment for Erika Porter's 2 month check up and your IUD on the same day.  Let me know if you need anything else.  Abem Shaddix M. Gelene Recktenwald, M.D.

## 2013-08-31 NOTE — Progress Notes (Signed)
  Subjective:     Erika Porter is a 28 y.o. female who presents for a postpartum visit. She is 6 weeks postpartum following a spontaneous vaginal delivery. I have fully reviewed the prenatal and intrapartum course. The delivery was at 39 gestational weeks. Outcome: spontaneous vaginal delivery. Anesthesia: epidural. Postpartum course has been uneventful. Baby's course has been good. Baby is feeding by bottle Rush Barer- Gerber. Bleeding red and changing a maxi pad and 3-4 times per day 4 times a day. Bowel function is normal. Bladder function is normal. Patient is not sexually active. Contraception method is Depo-Provera injections. Postpartum depression screening: negative.  The following portions of the patient's history were reviewed and updated as appropriate: allergies, current medications, past family history, past medical history, past social history, past surgical history and problem list.  Review of Systems Pertinent items are noted in HPI.   Objective:    Pulse 66  Temp(Src) 98.3 F (36.8 C) (Oral)  Resp 18  Ht 5\' 7"  (1.702 m)  Wt 142 lb (64.411 kg)  BMI 22.24 kg/m2        Gen: Awake, alert. Attentive to baby HEENT: AT, [place]. MMM Heart: RRR no murmur appreciated Lungs: CTAB Abd: Soft, nontender, nondistended Ext: No calf tenderness, no edema  Assessment:     Normal postpartum exam. Pap smear not done at today's visit.   Plan:    1. Contraception: Depo-Provera injections and plans to get IUD at next visit. 2. Bleeding most likely due to her Depo-Provera rather than delivery. Patient declined pelvic exam today. 3. Follow up in: 3 months or as needed.

## 2013-11-18 ENCOUNTER — Encounter (HOSPITAL_COMMUNITY): Payer: Self-pay | Admitting: Emergency Medicine

## 2013-11-18 ENCOUNTER — Emergency Department (INDEPENDENT_AMBULATORY_CARE_PROVIDER_SITE_OTHER)
Admission: EM | Admit: 2013-11-18 | Discharge: 2013-11-18 | Disposition: A | Discharge status: Home / Self Care | Payer: Medicaid Other | Source: Home / Self Care | Attending: Family Medicine | Admitting: Family Medicine

## 2013-11-18 DIAGNOSIS — W57XXXA Bitten or stung by nonvenomous insect and other nonvenomous arthropods, initial encounter: Secondary | ICD-10-CM

## 2013-11-18 DIAGNOSIS — T148 Other injury of unspecified body region: Secondary | ICD-10-CM

## 2013-11-18 MED ORDER — TRIAMCINOLONE ACETONIDE 0.025 % EX OINT: 1.0000 "application " | TOPICAL_OINTMENT | Freq: Two times a day (BID) | CUTANEOUS | Status: DC

## 2013-11-18 NOTE — ED Notes (Signed)
Insect bites.

## 2013-11-18 NOTE — ED Provider Notes (Signed)
CSN: [id number]     Arrival date & time 11/18/13  2001 History   First MD Initiated Contact with Patient 11/18/13 2025     Chief Complaint  Patient presents with  . Insect Bite   (Consider location/radiation/quality/duration/timing/severity/associated sxs/prior Treatment) HPI Comments: Pt complains of bedbug bites on her legs.   Pt reports itching.  Pt reports she needs medical documentaion for her apartment so that it can be treated.  Pt reports her children also have  The history is provided by the patient. No language interpreter was used.    Past Medical History  Diagnosis Date  . Chlamydia   . Trichomonas   . BV (bacterial vaginosis)   . Anemia   . NWGNFAOZ(308.6Headache(784.0)    Past Surgical History  Procedure Laterality Date  . No past surgeries     Family History  Problem Relation Age of Onset  . Hypertension Mother   . Anxiety disorder Mother   . Asthma Sister   . Asthma Daughter   . Asthma Son   . Cancer Maternal Aunt    History  Substance Use Topics  . Smoking status: Current Every Day Smoker -- 0.10 packs/day    Types: Cigarettes  . Smokeless tobacco: Never Used     Comment: quit with preg  . Alcohol Use: Yes     Comment: not with preg   OB History   Grav Para Term Preterm Abortions TAB SAB Ect Mult Living   4 4 4  0 0 0 0 0 0 4     Review of Systems  All other systems reviewed and are negative.   Allergies  Review of patient's allergies indicates no known allergies.  Home Medications   Prior to Admission medications   Medication Sig Start Date End Date Taking? Authorizing Provider  acetaminophen (TYLENOL) 500 MG tablet Take 1,000 mg by mouth daily as needed for headache.    Historical Provider, MD  clindamycin (CLEOCIN T) 1 % external solution Apply 1 application topically 2 (two) times daily.    Historical Provider, MD  ibuprofen (ADVIL,MOTRIN) 600 MG tablet Take 1 tablet (600 mg total) by mouth every 6 (six) hours as needed for mild pain, moderate pain or  cramping. 07/18/13   Marge DuncansKimberly Randall Booker, CNM  Prenatal Vit-Fe Fumarate-FA (PRENATAL MULTIVITAMIN) TABS tablet Take 1 tablet by mouth daily at 12 noon. 06/21/13   Amber Nydia BoutonM Hairford, MD  ranitidine (ZANTAC) 150 MG tablet Take 1 tablet (150 mg total) by mouth 2 (two) times daily. 05/07/13 05/07/14  Vale HavenKeli L Beck, MD  triamcinolone (KENALOG) 0.025 % ointment Apply 1 application topically 2 (two) times daily. 11/18/13   Elson AreasLeslie K Braleigh Massoud, PA-C   BP 127/75  Pulse 82  Temp(Src) 98.5 F (36.9 C) (Oral)  Resp 16  SpO2 100% Physical Exam  Constitutional: She appears well-developed and well-nourished.  HENT:  Head: Normocephalic.  Musculoskeletal: She exhibits tenderness.  Multiple insect bites lower legs.   Neurological: She is alert.  Skin: There is erythema.  Psychiatric: She has a normal mood and affect.    ED Course  Procedures (including critical care time) Labs Review Labs Reviewed - No data to display  Imaging Review No results found.   MDM   1. Insect bites    triamcinalone Pt counseled on bedbugs and treatment   Elson AreasLeslie K Zennie Ayars, PA-C 11/18/13 2046

## 2013-11-18 NOTE — Discharge Instructions (Signed)
Bedbugs  Bedbugs are tiny bugs that live in and around beds. During the day, they hide in mattresses and other places near beds. They come out at night and bite people lying in bed. They need blood to live and grow. Bedbugs can be found in beds anywhere. Usually, they are found in places where many people come and go (hotels, shelters, hospitals). It does not matter whether the place is dirty or clean.  Getting bitten by bedbugs rarely causes a medical problem. The biggest problem can be getting rid of them.  This often takes the work of a pest control expert.  CAUSES  · Less use of pesticides. Bedbugs were common before the 1950s. Then, strong pesticides such as DDT nearly wiped them out. Today, these pesticides are not used because they harm the environment and can cause health problems.  · More travel. Besides mattresses, bedbugs can also live in clothing and luggage. They can come along as people travel from place to place. Bedbugs are more common in certain parts of the world. When people travel to those areas, the bugs can come home with them.  · Presence of birds and bats. Bedbugs often infest birds and bats. If you have these animals in or near your home, bedbugs may infest your house, too.  SYMPTOMS  It does not hurt to be bitten by a bedbug. You will probably not wake up when you are bitten. Bedbugs usually bite areas of the skin that are not covered. Symptoms may show when you wake up, or they may take a day or more to show up. Symptoms may include:  · Small red bumps on the skin. These might be lined up in a row or clustered in a group.  · A darker red dot in the middle of red bumps.  · Blisters on the skin. There may be swelling and very bad itching. These may be signs of an allergic reaction. This does not happen often.  DIAGNOSIS  Bedbug bites might look and feel like other types of insect bites. The bugs do not stay on the body like ticks or lice. They bite, drop off, and crawl away to hide. Your  caregiver will probably:  · Ask about your symptoms.  · Ask about your recent activities and travel.  · Check your skin for bedbug bites.  · Ask you to check at home for signs of bedbugs. You should look for:  ¨ Spots or stains on the bed or nearby. This could be from bedbugs that were crushed or from their eggs or waste.  ¨ Bedbugs themselves. They are reddish-brown, oval, and flat. They do not fly. They are about the size of an apple seed.  · Places to look for bedbugs include:  ¨ Beds. Check mattresses, headboards, box springs, and bed frames.  ¨ On drapes and curtains near the bed.  ¨ Under carpeting in the bedroom.  ¨ Behind electrical outlets.  ¨ Behind any wallpaper that is peeling.  ¨ Inside luggage.  TREATMENT  Most bedbug bites do not need treatment. They usually go away on their own in a few days. The bites are not dangerous. However, treatment may be needed if you have scratched so much that your skin has become infected. You may also need treatment if you are allergic to bedbug bites. Treatment options include:  · A drug that stops swelling and itching (corticosteroid). Usually, a cream is rubbed on the skin. If you have a bad rash, you may be   given a corticosteroid pill.  · Oral antihistamines. These are pills to help control itching.  · Antibiotic medicines. An antibiotic may be prescribed for infected skin.  HOME CARE INSTRUCTIONS   · Take any medicine prescribed by your caregiver for your bites. Follow the directions carefully.  · Consider wearing pajamas with long sleeves and pant legs.  · Your bedroom may need to be treated. A pest control expert should make sure the bedbugs are gone. You may need to throw away mattresses or luggage. Ask the pest control expert what you can do to keep the bedbugs from coming back. Common suggestions include:  ¨ Putting a plastic cover over your mattress.  ¨ Washing and drying your clothes and bedding in hot water and a hot dryer. The temperature should be hotter  than 120° F (48.9° C). Bedbugs are killed by high temperatures.  ¨ Vacuuming carefully all around your bed. Vacuum in all cracks and crevices where the bugs might hide. Do this often.  ¨ Carefully checking all used furniture, bedding, or clothes that you bring into your house.  ¨ Eliminating bird nests and bat roosts.  · If you get bedbug bites when traveling, check all your possessions carefully before bringing them into your house. If you find any bugs on clothes or in your luggage, consider throwing those items away.  SEEK MEDICAL CARE IF:  · You have red bug bites that keep coming back.  · You have red bug bites that itch badly.  · You have bug bites that cause a skin rash.  · You have scratch marks that are red and sore.  SEEK IMMEDIATE MEDICAL CARE IF:  You have a fever.  Document Released: 06/08/2010 Document Revised: 07/29/2011 Document Reviewed: 06/08/2010  ExitCare® Patient Information ©2015 [employer]. This information is not intended to replace advice given to you by your health care provider. Make sure you discuss any questions you have with your health care provider.

## 2013-11-19 NOTE — ED Provider Notes (Signed)
Medical screening examination/treatment/procedure(s) were performed by a resident physician or non-physician practitioner and as the supervising physician I was immediately available for consultation/collaboration.  Philana Younis, MD    Gyselle Matthew S Marlette Curvin, MD 11/19/13 1650 

## 2013-12-15 ENCOUNTER — Other Ambulatory Visit (HOSPITAL_COMMUNITY)
Admission: RE | Admit: 2013-12-15 | Discharge: 2013-12-15 | Disposition: A | Discharge status: Home / Self Care | Payer: Medicaid Other | Source: Ambulatory Visit | Attending: Family Medicine | Admitting: Family Medicine

## 2013-12-15 ENCOUNTER — Emergency Department (HOSPITAL_COMMUNITY)
Admission: EM | Admit: 2013-12-15 | Discharge: 2013-12-15 | Disposition: A | Discharge status: Home / Self Care | Payer: Medicaid Other | Source: Home / Self Care | Attending: Family Medicine | Admitting: Family Medicine

## 2013-12-15 DIAGNOSIS — Z113 Encounter for screening for infections with a predominantly sexual mode of transmission: Secondary | ICD-10-CM | POA: Insufficient documentation

## 2013-12-15 DIAGNOSIS — N760 Acute vaginitis: Secondary | ICD-10-CM

## 2013-12-15 LAB — POCT URINALYSIS DIP (DEVICE)
BILIRUBIN URINE: NEGATIVE
GLUCOSE, UA: NEGATIVE mg/dL
KETONES UR: NEGATIVE mg/dL
Nitrite: NEGATIVE
Protein, ur: NEGATIVE mg/dL
SPECIFIC GRAVITY, URINE: 1.025 (ref 1.005–1.030)
Urobilinogen, UA: 1 mg/dL (ref 0.0–1.0)
pH: 6.5 (ref 5.0–8.0)

## 2013-12-15 LAB — POCT PREGNANCY, URINE: Preg Test, Ur: NEGATIVE

## 2013-12-15 MED ORDER — METRONIDAZOLE 500 MG PO TABS: 500.0000 mg | ORAL_TABLET | Freq: Two times a day (BID) | ORAL | Status: DC

## 2013-12-15 NOTE — ED Provider Notes (Signed)
Medical screening examination/treatment/procedure(s) were performed by resident physician or non-physician practitioner and as supervising physician I was immediately available for consultation/collaboration.   Mynor Witkop DOUGLAS MD.   Dillon Livermore D Akhila Mahnken, MD 12/15/13 2019 

## 2013-12-15 NOTE — Discharge Instructions (Signed)
Vaginitis Vaginitis is an inflammation of the vagina. It is most often caused by a change in the normal balance of the bacteria and yeast that live in the vagina. This change in balance causes an overgrowth of certain bacteria or yeast, which causes the inflammation. There are different types of vaginitis, but the most common types are:  Bacterial vaginosis.  Yeast infection (candidiasis).  Trichomoniasis vaginitis. This is a sexually transmitted infection (STI).  Viral vaginitis.  Atropic vaginitis.  Allergic vaginitis. CAUSES  The cause depends on the type of vaginitis. Vaginitis can be caused by:  Bacteria (bacterial vaginosis).  Yeast (yeast infection).  A parasite (trichomoniasis vaginitis)  A virus (viral vaginitis).  Low hormone levels (atrophic vaginitis). Low hormone levels can occur during pregnancy, breastfeeding, or after menopause.  Irritants, such as bubble baths, scented tampons, and feminine sprays (allergic vaginitis). Other factors can change the normal balance of the yeast and bacteria that live in the vagina. These include:  Antibiotic medicines.  Poor hygiene.  Diaphragms, vaginal sponges, spermicides, birth control pills, and intrauterine devices (IUD).  Sexual intercourse.  Infection.  Uncontrolled diabetes.  A weakened immune system. SYMPTOMS  Symptoms can vary depending on the cause of the vaginitis. Common symptoms include:  Abnormal vaginal discharge.  The discharge is white, gray, or yellow with bacterial vaginosis.  The discharge is thick, white, and cheesy with a yeast infection.  The discharge is frothy and yellow or greenish with trichomoniasis.  A bad vaginal odor.  The odor is fishy with bacterial vaginosis.  Vaginal itching, pain, or swelling.  Painful intercourse.  Pain or burning when urinating. Sometimes, there are no symptoms. TREATMENT  Treatment will vary depending on the type of infection.   Bacterial  vaginosis and trichomoniasis are often treated with antibiotic creams or pills.  Yeast infections are often treated with antifungal medicines, such as vaginal creams or suppositories.  Viral vaginitis has no cure, but symptoms can be treated with medicines that relieve discomfort. Your sexual partner should be treated as well.  Atrophic vaginitis may be treated with an estrogen cream, pill, suppository, or vaginal ring. If vaginal dryness occurs, lubricants and moisturizing creams may help. You may be told to avoid scented soaps, sprays, or douches.  Allergic vaginitis treatment involves quitting the use of the product that is causing the problem. Vaginal creams can be used to treat the symptoms. HOME CARE INSTRUCTIONS   Take all medicines as directed by your caregiver.  Keep your genital area clean and dry. Avoid soap and only rinse the area with water.  Avoid douching. It can remove the healthy bacteria in the vagina.  Do not use tampons or have sexual intercourse until your vaginitis has been treated. Use sanitary pads while you have vaginitis.  Wipe from front to back. This avoids the spread of bacteria from the rectum to the vagina.  Let air reach your genital area.  Wear cotton underwear to decrease moisture buildup.  Avoid wearing underwear while you sleep until your vaginitis is gone.  Avoid tight pants and underwear or nylons without a cotton panel.  Take off wet clothing (especially bathing suits) as soon as possible.  Use mild, non-scented products. Avoid using irritants, such as:  Scented feminine sprays.  Fabric softeners.  Scented detergents.  Scented tampons.  Scented soaps or bubble baths.  Practice safe sex and use condoms. Condoms may prevent the spread of trichomoniasis and viral vaginitis. SEEK MEDICAL CARE IF:   You have abdominal pain.  You   have a fever or persistent symptoms for more than 2-3 days.  You have a fever and your symptoms suddenly  get worse. Document Released: 03/03/2007 Document Revised: 01/29/2012 Document Reviewed: 10/17/2011 ExitCare Patient Information 2015 [employer]. This information is not intended to replace advice given to you by your health care provider. Make sure you discuss any questions you have with your health care provider. Bacterial Vaginosis Bacterial vaginosis is a vaginal infection that occurs when the normal balance of bacteria in the vagina is disrupted. It results from an overgrowth of certain bacteria. This is the most common vaginal infection in women of childbearing age. Treatment is important to prevent complications, especially in pregnant women, as it can cause a premature delivery. CAUSES  Bacterial vaginosis is caused by an increase in harmful bacteria that are normally present in smaller amounts in the vagina. Several different kinds of bacteria can cause bacterial vaginosis. However, the reason that the condition develops is not fully understood. RISK FACTORS Certain activities or behaviors can put you at an increased risk of developing bacterial vaginosis, including:  Having a new sex partner or multiple sex partners.  Douching.  Using an intrauterine device (IUD) for contraception. Women do not get bacterial vaginosis from toilet seats, bedding, swimming pools, or contact with objects around them. SIGNS AND SYMPTOMS  Some women with bacterial vaginosis have no signs or symptoms. Common symptoms include:  Grey vaginal discharge.  A fishlike odor with discharge, especially after sexual intercourse.  Itching or burning of the vagina and vulva.  Burning or pain with urination. DIAGNOSIS  Your health care provider will take a medical history and examine the vagina for signs of bacterial vaginosis. A sample of vaginal fluid may be taken. Your health care provider will look at this sample under a microscope to check for bacteria and abnormal cells. A vaginal pH test may also be  done.  TREATMENT  Bacterial vaginosis may be treated with antibiotic medicines. These may be given in the form of a pill or a vaginal cream. A second round of antibiotics may be prescribed if the condition comes back after treatment.  HOME CARE INSTRUCTIONS   Only take over-the-counter or prescription medicines as directed by your health care provider.  If antibiotic medicine was prescribed, take it as directed. Make sure you finish it even if you start to feel better.  Do not have sex until treatment is completed.  Tell all sexual partners that you have a vaginal infection. They should see their health care provider and be treated if they have problems, such as a mild rash or itching.  Practice safe sex by using condoms and only having one sex partner. SEEK MEDICAL CARE IF:   Your symptoms are not improving after 3 days of treatment.  You have increased discharge or pain.  You have a fever. MAKE SURE YOU:   Understand these instructions.  Will watch your condition.  Will get help right away if you are not doing well or get worse. FOR MORE INFORMATION  Centers for Disease Control and Prevention, Division of STD Prevention: www.cdc.gov/std American Sexual Health Association (ASHA): www.ashastd.org  Document Released: 05/06/2005 Document Revised: 02/24/2013 Document Reviewed: 12/16/2012 ExitCare Patient Information 2015 [employer]. This information is not intended to replace advice given to you by your health care provider. Make sure you discuss any questions you have with your health care provider.  

## 2013-12-15 NOTE — ED Provider Notes (Signed)
CSN: [id number]     Arrival date & time 12/15/13  1850 History   First MD Initiated Contact with Patient 12/15/13 1905     No chief complaint on file.  (Consider location/radiation/quality/duration/timing/severity/associated sxs/prior Treatment) HPI Comments: 28 year old female presents complaining of vaginal irritation. This has been since she finished her period about a week ago. She says she had some sort of brown vaginal discharge at the end of her period and she has had irritation since then. She denies any lower abdominal or pelvic pain, or any further vaginal discharge. She denies any other associated symptoms. She denies risk for STDs.   Past Medical History  Diagnosis Date  . Chlamydia   . Trichomonas   . BV (bacterial vaginosis)   . Anemia   . WGNFAOZH(086.5Headache(784.0)    Past Surgical History  Procedure Laterality Date  . No past surgeries     Family History  Problem Relation Age of Onset  . Hypertension Mother   . Anxiety disorder Mother   . Asthma Sister   . Asthma Daughter   . Asthma Son   . Cancer Maternal Aunt    History  Substance Use Topics  . Smoking status: Current Every Day Smoker -- 0.10 packs/day    Types: Cigarettes  . Smokeless tobacco: Never Used     Comment: quit with preg  . Alcohol Use: Yes     Comment: not with preg   OB History   Grav Para Term Preterm Abortions TAB SAB Ect Mult Living   4 4 4  0 0 0 0 0 0 4     Review of Systems  Gastrointestinal: Negative for nausea, vomiting and abdominal pain.  Genitourinary: Positive for vaginal discharge (with irritation). Negative for dysuria, urgency, frequency, hematuria, flank pain, genital sores, vaginal pain and pelvic pain.  All other systems reviewed and are negative.   Allergies  Review of patient's allergies indicates no known allergies.  Home Medications   Prior to Admission medications   Medication Sig Start Date End Date Taking? Authorizing Provider  acetaminophen (TYLENOL) 500 MG tablet  Take 1,000 mg by mouth daily as needed for headache.    Historical Provider, MD  clindamycin (CLEOCIN T) 1 % external solution Apply 1 application topically 2 (two) times daily.    Historical Provider, MD  ibuprofen (ADVIL,MOTRIN) 600 MG tablet Take 1 tablet (600 mg total) by mouth every 6 (six) hours as needed for mild pain, moderate pain or cramping. 07/18/13   Marge DuncansKimberly Randall Booker, CNM  metroNIDAZOLE (FLAGYL) 500 MG tablet Take 1 tablet (500 mg total) by mouth 2 (two) times daily. 12/15/13   Graylon GoodZachary H Nathanal Hermiz, PA-C  Prenatal Vit-Fe Fumarate-FA (PRENATAL MULTIVITAMIN) TABS tablet Take 1 tablet by mouth daily at 12 noon. 06/21/13   Amber Nydia BoutonM Hairford, MD  ranitidine (ZANTAC) 150 MG tablet Take 1 tablet (150 mg total) by mouth 2 (two) times daily. 05/07/13 05/07/14  Vale HavenKeli L Beck, MD  triamcinolone (KENALOG) 0.025 % ointment Apply 1 application topically 2 (two) times daily. 11/18/13   Elson AreasLeslie K Sofia, PA-C   BP 117/72  Pulse 87  Temp(Src) 98.6 F (37 C) (Oral)  Resp 20  Ht 5\' 7"  (1.702 m)  Wt 161 lb (73.029 kg)  BMI 25.21 kg/m2  SpO2 100% Physical Exam  Nursing note and vitals reviewed. Constitutional: She is oriented to person, place, and time. Vital signs are normal. She appears well-developed and well-nourished. No distress.  HENT:  Head: Normocephalic and atraumatic.  Pulmonary/Chest: Effort normal.  No respiratory distress.  Abdominal: Soft. Bowel sounds are normal. She exhibits no distension and no mass. There is no tenderness. There is no rebound and no guarding.  Genitourinary: There is no rash, tenderness or lesion on the right labia. There is no rash, tenderness or lesion on the left labia. Cervix exhibits no discharge and no friability. No erythema or bleeding around the vagina. Vaginal discharge (thin, white, malodorous) found.  Lymphadenopathy:       Right: No inguinal adenopathy present.       Left: No inguinal adenopathy present.  Neurological: She is alert and oriented to person,  place, and time. She has normal strength. Coordination normal.  Skin: Skin is warm and dry. No rash noted. She is not diaphoretic.  Psychiatric: She has a normal mood and affect. Judgment normal.    ED Course  Procedures (including critical care time) Labs Review Labs Reviewed  CERVICOVAGINAL ANCILLARY ONLY    Imaging Review No results found.   MDM   1. Vaginitis    Probable BV, treat with flagyl.  Cytology sent.  F/U PRN     Graylon Good, PA-C 12/15/13 1958

## 2013-12-17 NOTE — ED Notes (Signed)
GC/Chlamydia neg., Affirm: Candida and Trich neg., Gardnerella pos.  Pt. adequately treated with Flagyl. Vassie MoselleYork, Hazell Siwik M 12/17/2013

## 2013-12-25 ENCOUNTER — Emergency Department (HOSPITAL_COMMUNITY)
Admission: EM | Admit: 2013-12-25 | Discharge: 2013-12-25 | Disposition: A | Discharge status: Home / Self Care | Payer: Medicaid Other | Attending: Emergency Medicine | Admitting: Emergency Medicine

## 2013-12-25 ENCOUNTER — Encounter (HOSPITAL_COMMUNITY): Payer: Self-pay | Admitting: Emergency Medicine

## 2013-12-25 DIAGNOSIS — IMO0002 Reserved for concepts with insufficient information to code with codable children: Secondary | ICD-10-CM | POA: Insufficient documentation

## 2013-12-25 DIAGNOSIS — D649 Anemia, unspecified: Secondary | ICD-10-CM | POA: Diagnosis not present

## 2013-12-25 DIAGNOSIS — S99919A Unspecified injury of unspecified ankle, initial encounter: Secondary | ICD-10-CM

## 2013-12-25 DIAGNOSIS — W108XXA Fall (on) (from) other stairs and steps, initial encounter: Secondary | ICD-10-CM | POA: Insufficient documentation

## 2013-12-25 DIAGNOSIS — S99929A Unspecified injury of unspecified foot, initial encounter: Secondary | ICD-10-CM

## 2013-12-25 DIAGNOSIS — Z791 Long term (current) use of non-steroidal anti-inflammatories (NSAID): Secondary | ICD-10-CM | POA: Diagnosis not present

## 2013-12-25 DIAGNOSIS — Y929 Unspecified place or not applicable: Secondary | ICD-10-CM | POA: Insufficient documentation

## 2013-12-25 DIAGNOSIS — Y9389 Activity, other specified: Secondary | ICD-10-CM | POA: Insufficient documentation

## 2013-12-25 DIAGNOSIS — Z79899 Other long term (current) drug therapy: Secondary | ICD-10-CM | POA: Diagnosis not present

## 2013-12-25 DIAGNOSIS — S8990XA Unspecified injury of unspecified lower leg, initial encounter: Secondary | ICD-10-CM | POA: Diagnosis present

## 2013-12-25 DIAGNOSIS — S7010XA Contusion of unspecified thigh, initial encounter: Secondary | ICD-10-CM | POA: Diagnosis not present

## 2013-12-25 DIAGNOSIS — T148XXA Other injury of unspecified body region, initial encounter: Secondary | ICD-10-CM

## 2013-12-25 DIAGNOSIS — F17200 Nicotine dependence, unspecified, uncomplicated: Secondary | ICD-10-CM | POA: Diagnosis not present

## 2013-12-25 DIAGNOSIS — Z792 Long term (current) use of antibiotics: Secondary | ICD-10-CM | POA: Insufficient documentation

## 2013-12-25 MED ORDER — IBUPROFEN 600 MG PO TABS: 600.0000 mg | ORAL_TABLET | Freq: Four times a day (QID) | ORAL | Status: DC | PRN

## 2013-12-25 NOTE — ED Notes (Signed)
The pt fell down her steps 2 days ago her slippers caught as she was going down.  She has a large bruise to her rt thigh and she is having pain there

## 2013-12-25 NOTE — Discharge Instructions (Signed)
Contusion °A contusion is a deep bruise. Contusions are the result of an injury that caused bleeding under the skin. The contusion may turn blue, purple, or yellow. Minor injuries will give you a painless contusion, but more severe contusions may stay painful and swollen for a few weeks.  °CAUSES  °A contusion is usually caused by a blow, trauma, or direct force to an area of the body. °SYMPTOMS  °· Swelling and redness of the injured area. °· Bruising of the injured area. °· Tenderness and soreness of the injured area. °· Pain. °DIAGNOSIS  °The diagnosis can be made by taking a history and physical exam. An X-ray, CT scan, or MRI may be needed to determine if there were any associated injuries, such as fractures. °TREATMENT  °Specific treatment will depend on what area of the body was injured. In general, the best treatment for a contusion is resting, icing, elevating, and applying cold compresses to the injured area. Over-the-counter medicines may also be recommended for pain control. Ask your caregiver what the best treatment is for your contusion. °HOME CARE INSTRUCTIONS  °· Put ice on the injured area. °¨ Put ice in a plastic bag. °¨ Place a towel between your skin and the bag. °¨ Leave the ice on for 15-20 minutes, 3-4 times a day, or as directed by your health care provider. °· Only take over-the-counter or prescription medicines for pain, discomfort, or fever as directed by your caregiver. Your caregiver may recommend avoiding anti-inflammatory medicines (aspirin, ibuprofen, and naproxen) for 48 hours because these medicines may increase bruising. °· Rest the injured area. °· If possible, elevate the injured area to reduce swelling. °SEEK IMMEDIATE MEDICAL CARE IF:  °· You have increased bruising or swelling. °· You have pain that is getting worse. °· Your swelling or pain is not relieved with medicines. °MAKE SURE YOU:  °· Understand these instructions. °· Will watch your condition. °· Will get help right  away if you are not doing well or get worse. °Document Released: 02/13/2005 Document Revised: 05/11/2013 Document Reviewed: 03/11/2011 °ExitCare® Patient Information ©2015 [employer]. This information is not intended to replace advice given to you by your health care provider. Make sure you discuss any questions you have with your health care provider. ° °

## 2013-12-25 NOTE — ED Provider Notes (Signed)
CSN: [id number]     Arrival date & time 12/25/13  1911 History   First MD Initiated Contact with Patient 12/25/13 1931     This chart was scribed for non-physician practitioner, Roxy Horsemanobert Naiah Donahoe PA-C working with Purvis SheffieldForrest Harrison, MD by Arlan OrganAshley Leger, ED Scribe. This patient was seen in room TR09C/TR09C and the patient's care was started at 7:45 PM.   Chief Complaint  Patient presents with  . Leg Pain   The history is provided by the patient. No language interpreter was used.    HPI Comments: Erika Porter is a 28 y.o. female who presents to the Emergency Department complaining of constant, mild R thigh pain x 2 days that is unchanged. Pt states she fell down her steps on Thursday while wearing wet shower slippers. She has also noted a large bruise to the thigh that has increased in size since onset of fall. She has not tried any OTC medications or any home remedies to help manage symptoms. No fever or chills at this time. No known allergies to medications. No other concerns this visit.  Past Medical History  Diagnosis Date  . Chlamydia   . Trichomonas   . BV (bacterial vaginosis)   . Anemia   . XLKGMWNU(272.5Headache(784.0)    Past Surgical History  Procedure Laterality Date  . No past surgeries     Family History  Problem Relation Age of Onset  . Hypertension Mother   . Anxiety disorder Mother   . Asthma Sister   . Asthma Daughter   . Asthma Son   . Cancer Maternal Aunt    History  Substance Use Topics  . Smoking status: Current Every Day Smoker -- 0.10 packs/day    Types: Cigarettes  . Smokeless tobacco: Never Used     Comment: quit with preg  . Alcohol Use: Yes     Comment: not with preg   OB History   Grav Para Term Preterm Abortions TAB SAB Ect Mult Living   4 4 4  0 0 0 0 0 0 4     Review of Systems  Constitutional: Negative for fever and chills.  Skin: Positive for color change (Bruise to R thigh).      Allergies  Review of patient's allergies indicates no known  allergies.  Home Medications   Prior to Admission medications   Medication Sig Start Date End Date Taking? Authorizing Provider  acetaminophen (TYLENOL) 500 MG tablet Take 1,000 mg by mouth daily as needed for headache.    Historical Provider, MD  clindamycin (CLEOCIN T) 1 % external solution Apply 1 application topically 2 (two) times daily.    Historical Provider, MD  ibuprofen (ADVIL,MOTRIN) 600 MG tablet Take 1 tablet (600 mg total) by mouth every 6 (six) hours as needed for mild pain, moderate pain or cramping. 07/18/13   Marge DuncansKimberly Randall Booker, CNM  metroNIDAZOLE (FLAGYL) 500 MG tablet Take 1 tablet (500 mg total) by mouth 2 (two) times daily. 12/15/13   Graylon GoodZachary H Baker, PA-C  Prenatal Vit-Fe Fumarate-FA (PRENATAL MULTIVITAMIN) TABS tablet Take 1 tablet by mouth daily at 12 noon. 06/21/13   Amber Nydia BoutonM Hairford, MD  ranitidine (ZANTAC) 150 MG tablet Take 1 tablet (150 mg total) by mouth 2 (two) times daily. 05/07/13 05/07/14  Vale HavenKeli L Beck, MD  triamcinolone (KENALOG) 0.025 % ointment Apply 1 application topically 2 (two) times daily. 11/18/13   Elson AreasLeslie K Sofia, PA-C   Triage Vitals: BP 112/69  Pulse 60  Temp(Src) 98.7 F (37.1 C)  Resp 16  Ht 5\' 7"  (1.702 m)  Wt 165 lb (74.844 kg)  BMI 25.84 kg/m2  SpO2 97%  LMP 12/14/2013   Physical Exam  Nursing note and vitals reviewed. Constitutional: She is oriented to person, place, and time. She appears well-developed and well-nourished.  HENT:  Head: Normocephalic.  Eyes: EOM are normal.  Neck: Normal range of motion.  Pulmonary/Chest: Effort normal.  Abdominal: She exhibits no distension.  Musculoskeletal: Normal range of motion.  ROM and strength to lower extremities 5/5, no evidence of muscle tare, no evidence of DVT, compartments are soft and non tender, no evidence of compartment syndrome   Neurological: She is alert and oriented to person, place, and time.  Skin:  6x6 cm contusion to the posterolateral R upper thigh, no evidence of deep  hematoma with palpitation. No surrounding erythema or cellulitis   Psychiatric: She has a normal mood and affect.    ED Course  Procedures (including critical care time)  DIAGNOSTIC STUDIES: Oxygen Saturation is 97% on RA, Normal by my interpretation.    COORDINATION OF CARE: 7:49 PM- Will give antiinflammatory at discharge to help manage symptoms. Discussed treatment plan with pt at bedside and pt agreed to plan.      Labs Review Labs Reviewed - No data to display  Imaging Review No results found.   EKG Interpretation None      MDM   Final diagnoses:  Contusion    Patient with contusion after fall down 2 stairs.  No sign of deep hematoma on palpation.  No sign of muscle tear.  Normal ROM and strength of legs.  Appears to be simple contusion.  I personally performed the services described in this documentation, which was scribed in my presence. The recorded information has been reviewed and is accurate.    Roxy Horseman, PA-C 12/25/13 2005

## 2013-12-26 NOTE — ED Provider Notes (Signed)
Medical screening examination/treatment/procedure(s) were performed by non-physician practitioner and as supervising physician I was immediately available for consultation/collaboration.   EKG Interpretation None        Anatasia Tino, MD 12/26/13 1221 

## 2014-02-15 ENCOUNTER — Ambulatory Visit: Payer: Self-pay | Admitting: Family Medicine

## 2014-02-21 ENCOUNTER — Encounter (HOSPITAL_COMMUNITY): Payer: Self-pay | Admitting: Emergency Medicine

## 2014-02-21 ENCOUNTER — Emergency Department (HOSPITAL_COMMUNITY)
Admission: EM | Admit: 2014-02-21 | Discharge: 2014-02-21 | Disposition: A | Discharge status: Home / Self Care | Payer: Medicaid Other | Attending: Emergency Medicine | Admitting: Emergency Medicine

## 2014-02-21 DIAGNOSIS — Z8619 Personal history of other infectious and parasitic diseases: Secondary | ICD-10-CM | POA: Diagnosis not present

## 2014-02-21 DIAGNOSIS — Z862 Personal history of diseases of the blood and blood-forming organs and certain disorders involving the immune mechanism: Secondary | ICD-10-CM | POA: Insufficient documentation

## 2014-02-21 DIAGNOSIS — R51 Headache: Secondary | ICD-10-CM | POA: Diagnosis present

## 2014-02-21 DIAGNOSIS — Z720 Tobacco use: Secondary | ICD-10-CM | POA: Insufficient documentation

## 2014-02-21 DIAGNOSIS — G44219 Episodic tension-type headache, not intractable: Secondary | ICD-10-CM | POA: Insufficient documentation

## 2014-02-21 DIAGNOSIS — Z8742 Personal history of other diseases of the female genital tract: Secondary | ICD-10-CM | POA: Insufficient documentation

## 2014-02-21 DIAGNOSIS — R519 Headache, unspecified: Secondary | ICD-10-CM

## 2014-02-21 MED ORDER — IBUPROFEN 600 MG PO TABS: 600.0000 mg | ORAL_TABLET | Freq: Four times a day (QID) | ORAL | Status: DC | PRN

## 2014-02-21 MED ORDER — KETOROLAC TROMETHAMINE 60 MG/2ML IM SOLN
60.0000 mg | Freq: Once | INTRAMUSCULAR | Status: AC
  Administered 2014-02-21: 60 mg via INTRAMUSCULAR
  Filled 2014-02-21: qty 2

## 2014-02-21 NOTE — ED Notes (Signed)
Chronic migraine h/a's. Frontal and some blurred vision. Ibuprofen sometimes help.

## 2014-02-21 NOTE — Discharge Instructions (Signed)

## 2014-02-21 NOTE — ED Provider Notes (Signed)
CSN: [id number]     Arrival date & time 02/21/14  0808 History   First MD Initiated Contact with Patient 02/21/14 0809     Chief Complaint  Patient presents with  . Migraine     (Consider location/radiation/quality/duration/timing/severity/associated sxs/prior Treatment) HPI  This is a 28 year old female with history of anemia and headaches who presents with a headache. Patient reports headaches approximately every other day for the last 14-16 months. She states that her headache started during pregnancy. She usually takes Tylenol and Motrin at home and her headaches improve after 45 minutes. She ran out of Motrin at home. She took her Tylenol this morning without relief. Current pain is 7/10. She denies any nausea but does endorse photophobia. She denies any fevers or neck stiffness. She reports that she occasionally gets spots in her vision. She denies any peripheral vision loss. She has not seen an outpatient neurologist for her frequent headaches. She is followed by family medicine.  Past Medical History  Diagnosis Date  . Chlamydia   . Trichomonas   . BV (bacterial vaginosis)   . Anemia   . QMVHQION(629.5Headache(784.0)    Past Surgical History  Procedure Laterality Date  . No past surgeries     Family History  Problem Relation Age of Onset  . Hypertension Mother   . Anxiety disorder Mother   . Asthma Sister   . Asthma Daughter   . Asthma Son   . Cancer Maternal Aunt    History  Substance Use Topics  . Smoking status: Current Every Day Smoker -- 0.10 packs/day    Types: Cigarettes  . Smokeless tobacco: Never Used     Comment: quit with preg  . Alcohol Use: Yes     Comment: not with preg   OB History   Grav Para Term Preterm Abortions TAB SAB Ect Mult Living   4 4 4  0 0 0 0 0 0 4     Review of Systems  Constitutional: Negative for fever.  HENT: Negative for congestion.   Eyes: Positive for photophobia.  Respiratory: Negative for chest tightness and shortness of breath.    Cardiovascular: Negative for chest pain.  Gastrointestinal: Negative for nausea, vomiting and abdominal pain.  Genitourinary: Negative for dysuria.  Musculoskeletal: Negative for back pain, neck pain and neck stiffness.  Neurological: Negative for dizziness, weakness, numbness and headaches.  Psychiatric/Behavioral: Negative for confusion.  All other systems reviewed and are negative.     Allergies  Review of patient's allergies indicates no known allergies.  Home Medications   Prior to Admission medications   Medication Sig Start Date End Date Taking? Authorizing Provider  acetaminophen (TYLENOL) 500 MG tablet Take 1,500 mg by mouth every 6 (six) hours as needed for headache.    Yes Historical Provider, MD  ibuprofen (ADVIL,MOTRIN) 600 MG tablet Take 1 tablet (600 mg total) by mouth every 6 (six) hours as needed. 02/21/14   Shon Batonourtney F Horton, MD   BP 117/69  Pulse 81  Temp(Src) 98.7 F (37.1 C) (Oral)  Resp 16  SpO2 100%  LMP 01/31/2014 Physical Exam  Nursing note and vitals reviewed. Constitutional: She is oriented to person, place, and time. She appears well-developed and well-nourished. No distress.  HENT:  Head: Normocephalic and atraumatic.  Eyes: EOM are normal. Pupils are equal, round, and reactive to light.  Neck: Neck supple. No JVD present.  Cardiovascular: Normal rate, regular rhythm and normal heart sounds.   No murmur heard. Pulmonary/Chest: Effort normal and breath sounds  normal. No respiratory distress. She has no wheezes.  Musculoskeletal: She exhibits no edema.  Neurological: She is alert and oriented to person, place, and time.  Cranial nerves II through XII intact, 5 out of 5 strength in all 4 extremities  Skin: Skin is warm and dry.  Psychiatric: She has a normal mood and affect.    ED Course  Procedures (including critical care time) Labs Review Labs Reviewed - No data to display  Imaging Review No results found.   EKG Interpretation None       MDM   Final diagnoses:  Nonintractable episodic headache, unspecified headache type    Patient presents with headache. Headache is chronic in nature and episodic. Reports ibuprofen helps at home but has run out of ibuprofen. Has not seen an outpatient neurologist. She is nontoxic and nonfocal on exam. She is afebrile. No signs of meningismus. Low suspicion for subarachnoid or meningitis. The patient was given IM Toradol with complete resolution of her headache.  Patient's headache did start during pregnancy; however, that she is nonfocal without vision changes would not pursue further imaging at this time. Discuss with patient continuing ibuprofen at home. She will be referred to neurology.  After history, exam, and medical workup I feel the patient has been appropriately medically screened and is safe for discharge home. Pertinent diagnoses were discussed with the patient. Patient was given return precautions.     Shon Baton, MD 02/21/14 [id number]

## 2014-03-21 ENCOUNTER — Encounter (HOSPITAL_COMMUNITY): Payer: Self-pay | Admitting: Emergency Medicine

## 2014-03-25 ENCOUNTER — Encounter (HOSPITAL_COMMUNITY): Payer: Self-pay | Admitting: Emergency Medicine

## 2014-03-25 ENCOUNTER — Emergency Department (HOSPITAL_COMMUNITY)
Admission: EM | Admit: 2014-03-25 | Discharge: 2014-03-25 | Disposition: A | Discharge status: Home / Self Care | Payer: Medicaid Other | Attending: Emergency Medicine | Admitting: Emergency Medicine

## 2014-03-25 DIAGNOSIS — R197 Diarrhea, unspecified: Secondary | ICD-10-CM | POA: Diagnosis present

## 2014-03-25 DIAGNOSIS — R42 Dizziness and giddiness: Secondary | ICD-10-CM | POA: Insufficient documentation

## 2014-03-25 DIAGNOSIS — B349 Viral infection, unspecified: Secondary | ICD-10-CM

## 2014-03-25 DIAGNOSIS — Z87448 Personal history of other diseases of urinary system: Secondary | ICD-10-CM | POA: Diagnosis not present

## 2014-03-25 DIAGNOSIS — J069 Acute upper respiratory infection, unspecified: Secondary | ICD-10-CM

## 2014-03-25 DIAGNOSIS — R509 Fever, unspecified: Secondary | ICD-10-CM

## 2014-03-25 DIAGNOSIS — R112 Nausea with vomiting, unspecified: Secondary | ICD-10-CM | POA: Diagnosis not present

## 2014-03-25 DIAGNOSIS — Z862 Personal history of diseases of the blood and blood-forming organs and certain disorders involving the immune mechanism: Secondary | ICD-10-CM | POA: Insufficient documentation

## 2014-03-25 DIAGNOSIS — R1084 Generalized abdominal pain: Secondary | ICD-10-CM | POA: Diagnosis not present

## 2014-03-25 DIAGNOSIS — Z720 Tobacco use: Secondary | ICD-10-CM | POA: Insufficient documentation

## 2014-03-25 DIAGNOSIS — Z3202 Encounter for pregnancy test, result negative: Secondary | ICD-10-CM | POA: Insufficient documentation

## 2014-03-25 LAB — CBC WITH DIFFERENTIAL/PLATELET
Basophils Absolute: 0 10*3/uL (ref 0.0–0.1)
Basophils Relative: 0 % (ref 0–1)
EOS PCT: 7 % — AB (ref 0–5)
Eosinophils Absolute: 0.6 10*3/uL (ref 0.0–0.7)
HCT: 35.4 % — ABNORMAL LOW (ref 36.0–46.0)
Hemoglobin: 11.4 g/dL — ABNORMAL LOW (ref 12.0–15.0)
LYMPHS ABS: 2.2 10*3/uL (ref 0.7–4.0)
LYMPHS PCT: 25 % (ref 12–46)
MCH: 26.3 pg (ref 26.0–34.0)
MCHC: 32.2 g/dL (ref 30.0–36.0)
MCV: 81.6 fL (ref 78.0–100.0)
MONO ABS: 0.6 10*3/uL (ref 0.1–1.0)
Monocytes Relative: 7 % (ref 3–12)
Neutro Abs: 5.4 10*3/uL (ref 1.7–7.7)
Neutrophils Relative %: 61 % (ref 43–77)
Platelets: 268 10*3/uL (ref 150–400)
RBC: 4.34 MIL/uL (ref 3.87–5.11)
RDW: 13.7 % (ref 11.5–15.5)
WBC: 8.8 10*3/uL (ref 4.0–10.5)

## 2014-03-25 LAB — URINALYSIS, DIPSTICK ONLY
BILIRUBIN URINE: NEGATIVE
Glucose, UA: NEGATIVE mg/dL
HGB URINE DIPSTICK: NEGATIVE
KETONES UR: NEGATIVE mg/dL
Leukocytes, UA: NEGATIVE
Nitrite: NEGATIVE
PROTEIN: NEGATIVE mg/dL
Specific Gravity, Urine: 1.025 (ref 1.005–1.030)
UROBILINOGEN UA: 0.2 mg/dL (ref 0.0–1.0)
pH: 6 (ref 5.0–8.0)

## 2014-03-25 LAB — COMPREHENSIVE METABOLIC PANEL
ALT: 21 U/L (ref 0–35)
AST: 21 U/L (ref 0–37)
Albumin: 3.4 g/dL — ABNORMAL LOW (ref 3.5–5.2)
Alkaline Phosphatase: 70 U/L (ref 39–117)
Anion gap: 13 (ref 5–15)
BUN: 7 mg/dL (ref 6–23)
CO2: 23 meq/L (ref 19–32)
CREATININE: 0.74 mg/dL (ref 0.50–1.10)
Calcium: 9 mg/dL (ref 8.4–10.5)
Chloride: 104 mEq/L (ref 96–112)
GFR calc Af Amer: >90 mL/min (ref >90)
GLUCOSE: 94 mg/dL (ref 70–99)
Potassium: 4.2 mEq/L (ref 3.7–5.3)
SODIUM: 140 meq/L (ref 137–147)
Total Protein: 7.3 g/dL (ref 6.0–8.3)

## 2014-03-25 LAB — POC URINE PREG, ED: Preg Test, Ur: NEGATIVE

## 2014-03-25 LAB — LIPASE, BLOOD: Lipase: 33 U/L (ref 11–59)

## 2014-03-25 MED ORDER — SODIUM CHLORIDE 0.9 % IV SOLN
1000.0000 mL | Freq: Once | INTRAVENOUS | Status: AC
  Administered 2014-03-25: 1000 mL via INTRAVENOUS

## 2014-03-25 MED ORDER — ACETAMINOPHEN 500 MG PO TABS
1000.0000 mg | ORAL_TABLET | Freq: Once | ORAL | Status: AC
  Administered 2014-03-25: 1000 mg via ORAL
  Filled 2014-03-25: qty 2

## 2014-03-25 MED ORDER — ONDANSETRON HCL 4 MG/2ML IJ SOLN
4.0000 mg | Freq: Once | INTRAMUSCULAR | Status: AC
  Administered 2014-03-25: 4 mg via INTRAVENOUS
  Filled 2014-03-25: qty 2

## 2014-03-25 MED ORDER — SODIUM CHLORIDE 0.9 % IV BOLUS (SEPSIS)
1000.0000 mL | Freq: Once | INTRAVENOUS | Status: AC
  Administered 2014-03-25: 1000 mL via INTRAVENOUS

## 2014-03-25 MED ORDER — ONDANSETRON HCL 4 MG PO TABS: 4.0000 mg | ORAL_TABLET | Freq: Three times a day (TID) | ORAL | Status: DC | PRN

## 2014-03-25 MED ORDER — KETOROLAC TROMETHAMINE 30 MG/ML IJ SOLN
30.0000 mg | Freq: Once | INTRAMUSCULAR | Status: AC
  Administered 2014-03-25: 30 mg via INTRAVENOUS
  Filled 2014-03-25: qty 1

## 2014-03-25 MED ORDER — ONDANSETRON HCL 4 MG/2ML IJ SOLN
INTRAMUSCULAR | Status: AC
  Filled 2014-03-25: qty 2

## 2014-03-25 NOTE — Discharge Instructions (Signed)
1. Tylenol and motrin as needed for fever 2. Your symptoms should get better in the next week or so 3. Drink plenty of fluids 4. zofran as needed for nausea 5. Cough suppressants as needed 6. Wash your hands 7. Come back if worsening symptoms Nausea and Vomiting Nausea is a sick feeling that often comes before throwing up (vomiting). Vomiting is a reflex where stomach contents come out of your mouth. Vomiting can cause severe loss of body fluids (dehydration). Children and elderly adults can become dehydrated quickly, especially if they also have diarrhea. Nausea and vomiting are symptoms of a condition or disease. It is important to find the cause of your symptoms. CAUSES   Direct irritation of the stomach lining. This irritation can result from increased acid production (gastroesophageal reflux disease), infection, food poisoning, taking certain medicines (such as nonsteroidal anti-inflammatory drugs), alcohol use, or tobacco use.  Signals from the brain.These signals could be caused by a headache, heat exposure, an inner ear disturbance, increased pressure in the brain from injury, infection, a tumor, or a concussion, pain, emotional stimulus, or metabolic problems.  An obstruction in the gastrointestinal tract (bowel obstruction).  Illnesses such as diabetes, hepatitis, gallbladder problems, appendicitis, kidney problems, cancer, sepsis, atypical symptoms of a heart attack, or eating disorders.  Medical treatments such as chemotherapy and radiation.  Receiving medicine that makes you sleep (general anesthetic) during surgery. DIAGNOSIS Your caregiver may ask for tests to be done if the problems do not improve after a few days. Tests may also be done if symptoms are severe or if the reason for the nausea and vomiting is not clear. Tests may include:  Urine tests.  Blood tests.  Stool tests.  Cultures (to look for evidence of infection).  X-rays or other imaging studies. Test  results can help your caregiver make decisions about treatment or the need for additional tests. TREATMENT You need to stay well hydrated. Drink frequently but in small amounts.You may wish to drink water, sports drinks, clear broth, or eat frozen ice pops or gelatin dessert to help stay hydrated.When you eat, eating slowly may help prevent nausea.There are also some antinausea medicines that may help prevent nausea. HOME CARE INSTRUCTIONS   Take all medicine as directed by your caregiver.  If you do not have an appetite, do not force yourself to eat. However, you must continue to drink fluids.  If you have an appetite, eat a normal diet unless your caregiver tells you differently.  Eat a variety of complex carbohydrates (rice, wheat, potatoes, bread), lean meats, yogurt, fruits, and vegetables.  Avoid high-fat foods because they are more difficult to digest.  Drink enough water and fluids to keep your urine clear or pale yellow.  If you are dehydrated, ask your caregiver for specific rehydration instructions. Signs of dehydration may include:  Severe thirst.  Dry lips and mouth.  Dizziness.  Dark urine.  Decreasing urine frequency and amount.  Confusion.  Rapid breathing or pulse. SEEK IMMEDIATE MEDICAL CARE IF:   You have blood or brown flecks (like coffee grounds) in your vomit.  You have black or bloody stools.  You have a severe headache or stiff neck.  You are confused.  You have severe abdominal pain.  You have chest pain or trouble breathing.  You do not urinate at least once every 8 hours.  You develop cold or clammy skin.  You continue to vomit for longer than 24 to 48 hours.  You have a fever. MAKE SURE  YOU:   Understand these instructions.  Will watch your condition.  Will get help right away if you are not doing well or get worse. Document Released: 05/06/2005 Document Revised: 07/29/2011 Document Reviewed: 10/03/2010 [facility] Patient  Information 2015 [place], [place]. This information is not intended to replace advice given to you by your health care provider. Make sure you discuss any questions you have with your health care provider.  Diarrhea Diarrhea is watery poop (stool). It can make you feel weak, tired, thirsty, or give you a dry mouth (signs of dehydration). Watery poop is a sign of another problem, most often an infection. It often lasts 2-3 days. It can last longer if it is a sign of something serious. Take care of yourself as told by your doctor. HOME CARE   Drink 1 cup (8 ounces) of fluid each time you have watery poop.  Do not drink the following fluids:  Those that contain simple sugars (fructose, glucose, galactose, lactose, sucrose, maltose).  Sports drinks.  Fruit juices.  Whole milk products.  Sodas.  Drinks with caffeine (coffee, tea, soda) or alcohol.  Oral rehydration solution may be used if the doctor says it is okay. You may make your own solution. Follow this recipe:   - teaspoon table salt.   teaspoon baking soda.   teaspoon salt substitute containing potassium chloride.  1 tablespoons sugar.  1 liter (34 ounces) of water.  Avoid the following foods:  High fiber foods, such as raw fruits and vegetables.  Nuts, seeds, and whole grain breads and cereals.   Those that are sweetened with sugar alcohols (xylitol, sorbitol, mannitol).  Try eating the following foods:  Starchy foods, such as rice, toast, pasta, low-sugar cereal, oatmeal, baked potatoes, crackers, and bagels.  Bananas.  Applesauce.  Eat probiotic-rich foods, such as yogurt and milk products that are fermented.  Wash your hands well after each time you have watery poop.  Only take medicine as told by your doctor.  Take a warm bath to help lessen burning or pain from having watery poop. GET HELP RIGHT AWAY IF:   You cannot drink fluids without throwing up (vomiting).  You keep throwing up.  You have  blood in your poop, or your poop looks black and tarry.  You do not pee (urinate) in 6-8 hours, or there is only a small amount of very dark pee.  You have belly (abdominal) pain that gets worse or stays in the same spot (localizes).  You are weak, dizzy, confused, or light-headed.  You have a very bad headache.  Your watery poop gets worse or does not get better.  You have a fever or lasting symptoms for more than 2-3 days.  You have a fever and your symptoms suddenly get worse. MAKE SURE YOU:   Understand these instructions.  Will watch your condition.  Will get help right away if you are not doing well or get worse. Document Released: 10/23/2007 Document Revised: 09/20/2013 Document Reviewed: 01/12/2012 [facility]Exit[facility] Patient Information 2015 [place], [place]. This information is not intended to replace advice given to you by your health care provider. Make sure you discuss any questions you have with your health care provider.

## 2014-03-25 NOTE — ED Notes (Signed)
Pt c/o N/V/D and cold symptoms x 4 days. Pt reports her children with same symptoms presently.

## 2014-03-25 NOTE — ED Provider Notes (Signed)
CSN: [id number]     Arrival date & time 03/25/14  0800 History   First MD Initiated Contact with Patient 03/25/14 0802     Chief Complaint  Patient presents with  . Emesis  . Diarrhea     (Consider location/radiation/quality/duration/timing/severity/associated sxs/prior Treatment) Patient is a 28 y.o. female presenting with vomiting. The history is provided by the patient. No language interpreter was used.  Emesis Severity:  Moderate Duration:  4 days Timing:  Constant Number of daily episodes:  3 Quality:  Stomach contents Able to tolerate:  Liquids Progression:  Unchanged Chronicity:  New Recent urination:  Normal Context: not post-tussive and not self-induced   Relieved by:  Nothing Worsened by:  Nothing tried Associated symptoms: abdominal pain (crampy), cough, diarrhea, fever (subjective) and URI   Associated symptoms: no headaches, no myalgias and no sore throat   Abdominal pain:    Location:  Generalized   Quality:  Cramping   Severity:  Moderate   Onset quality:  Gradual   Duration:  4 days   Timing:  Intermittent   Progression:  Unchanged   Chronicity:  New Cough:    Cough characteristics:  Non-productive Diarrhea:    Quality:  Watery   Number of occurrences:  3   Severity:  Moderate   Duration:  4 days   Timing:  Constant   Progression:  Unchanged Fever:    Duration:  4 days   Temp source:  Subjective Risk factors: sick contacts   Risk factors: no alcohol use, no diabetes, no suspect food intake and no travel to endemic areas     Past Medical History  Diagnosis Date  . Chlamydia   . Trichomonas   . BV (bacterial vaginosis)   . Anemia   . JXBJYNWG(956.2)    Past Surgical History  Procedure Laterality Date  . No past surgeries     Family History  Problem Relation Age of Onset  . Hypertension Mother   . Anxiety disorder Mother   . Asthma Sister   . Asthma Daughter   . Asthma Son   . Cancer Maternal Aunt    History  Substance Use Topics   . Smoking status: Current Every Day Smoker -- 0.10 packs/day    Types: Cigarettes  . Smokeless tobacco: Never Used     Comment: quit with preg  . Alcohol Use: Yes     Comment: not with preg   OB History    Gravida Para Term Preterm AB TAB SAB Ectopic Multiple Living   4 4 4  0 0 0 0 0 0 4     Review of Systems  Constitutional: Positive for fever (subjective).  HENT: Positive for congestion and rhinorrhea. Negative for sore throat.   Respiratory: Positive for cough. Negative for shortness of breath.   Cardiovascular: Negative for chest pain.  Gastrointestinal: Positive for nausea, vomiting, abdominal pain (crampy) and diarrhea.  Genitourinary: Negative for dysuria and hematuria.  Musculoskeletal: Negative for myalgias.  Skin: Negative for rash.  Neurological: Positive for light-headedness. Negative for syncope and headaches.  All other systems reviewed and are negative.     Allergies  Review of patient's allergies indicates no known allergies.  Home Medications   Prior to Admission medications   Medication Sig Start Date End Date Taking? Authorizing Provider  acetaminophen (TYLENOL) 500 MG tablet Take 1,500 mg by mouth every 6 (six) hours as needed for headache.     Historical Provider, MD  ibuprofen (ADVIL,MOTRIN) 600 MG tablet Take 1 tablet (  600 mg total) by mouth every 6 (six) hours as needed. 02/21/14   Shon Batonourtney F Horton, MD   BP 117/86 mmHg  Pulse 71  Temp(Src) 97.9 F (36.6 C) (Oral)  Resp 18  Ht 5\' 7"  (1.702 m)  Wt 165 lb (74.844 kg)  BMI 25.84 kg/m2  SpO2 98%  LMP 02/03/2014 (Approximate)  Breastfeeding? No Physical Exam  Constitutional: She is oriented to person, place, and time. She appears well-developed and well-nourished.  HENT:  Head: Normocephalic and atraumatic.  Right Ear: External ear normal.  Left Ear: External ear normal.  Eyes: EOM are normal.  Neck: Normal range of motion. Neck supple.  Cardiovascular: Normal rate, regular rhythm and intact  distal pulses.  Exam reveals no gallop and no friction rub.   No murmur heard. Pulmonary/Chest: Effort normal and breath sounds normal. No respiratory distress. She has no wheezes. She has no rales. She exhibits no tenderness.  Abdominal: Soft. Bowel sounds are normal. She exhibits no distension. There is no tenderness. There is no rebound.  Musculoskeletal: Normal range of motion. She exhibits no edema or tenderness.  Lymphadenopathy:    She has no cervical adenopathy.  Neurological: She is alert and oriented to person, place, and time.  Skin: Skin is warm. No rash noted.  Psychiatric: She has a normal mood and affect. Her behavior is normal.  Nursing note and vitals reviewed.   ED Course  Procedures (including critical care time) Labs Review Labs Reviewed  CBC WITH DIFFERENTIAL - Abnormal; Notable for the following:    Hemoglobin 11.4 (*)    HCT 35.4 (*)    Eosinophils Relative 7 (*)    All other components within normal limits  COMPREHENSIVE METABOLIC PANEL - Abnormal; Notable for the following:    Albumin 3.4 (*)    Total Bilirubin <0.2 (*)    All other components within normal limits  LIPASE, BLOOD  URINALYSIS, DIPSTICK ONLY  POC URINE PREG, ED    Imaging Review No results found.   EKG Interpretation None      MDM   Final diagnoses:  Fever in adult  Viral illness  Non-intractable vomiting with nausea, vomiting of unspecified type  Diarrhea  URI (upper respiratory infection)    8:02 AM Pt is a 28 y.o. female with no pertinent PMHX who presents to the ED with URI symptoms, nausea, vomiting and diarrhea and subjective fever for the past 4 days. Son sick with similar symptoms. No flu shot. Endorses vomiting 2x in the past 24 hours non bilious non bloody emesis, endorses non bloody diarrhea x 3 in the past 24 hours. Subjective fever. No syncope, endorses lightheadedness. Denies any urinary symptoms. Cough non productive. Endorses congestion and rhinorrhea. No suspect  food intake or out of country travel. Denies GU symptoms  On exam: well appearing, non toxic, no cervical adenopathy. Supple neck no meningeal signs. Abdomen benign, no tenderness. No surgeries positive bowel sounds low suspicion for acute obstruction. No ETOH abuse. No previous history of pancreatitis. No murphy's. Plan for abdominal labs, electrolytes, fluids and antiemetics. Likely viral illness. Does not meet window for tamiflu, not in high risk group. Mild dry membranes without clinical evidence of dehydration  Review of labs: CBc: no leukocytosis, H&H 11.4/35.4 CMP: no electrolyte abnormalities, no elevated LFTs Lipase: 33 UPT: neative UA dip: negative for UTI, no evidence of dehydration  No evidence of pancreatitis, low suspicion for biliary disease. Patient with viral illness. Likely influenza given URI symptoms and nausea, vomiting and diarrhea. Plan  for discharge with supportive care with strict return precautions and close follow up with PCP. Repeat abdominal exam: benign abdomen.  9:34 AM:  I have discussed the diagnosis/risks/treatment options with the patient and believe the pt to be eligible for discharge home to follow-up with PCP. We also discussed returning to the ED immediately if new or worsening sx occur. We discussed the sx which are most concerning (e.g., worsening symptoms) that necessitate immediate return. Any new prescriptions provided to the patient are listed below.   New Prescriptions   ONDANSETRON (ZOFRAN) 4 MG TABLET    Take 1 tablet (4 mg total) by mouth every 8 (eight) hours as needed for nausea or vomiting.    The patient appears reasonably screened and/or stabilized for discharge and I doubt any other medical condition or other [facility] requiring further screening, evaluation or treatment in the ED at this time prior to discharge . Pt in agreement with discharge plan. Return precautions given. Pt discharged VSS  Labs reviewed by myself and considered in medical  decision making if ordered.  Pt was discussed with my attending, Dr. Ardine EngZavitz     Taneshia Lorence Peter Pluma Diniz, MD 03/25/14 1623  Enid SkeensJoshua M Zavitz, MD 03/25/14 [phone]

## 2014-05-31 ENCOUNTER — Encounter (HOSPITAL_COMMUNITY): Payer: Self-pay | Admitting: Emergency Medicine

## 2014-05-31 ENCOUNTER — Emergency Department (HOSPITAL_COMMUNITY)
Admission: EM | Admit: 2014-05-31 | Discharge: 2014-05-31 | Disposition: A | Discharge status: Home / Self Care | Payer: Self-pay | Attending: Emergency Medicine | Admitting: Emergency Medicine

## 2014-05-31 DIAGNOSIS — B9689 Other specified bacterial agents as the cause of diseases classified elsewhere: Secondary | ICD-10-CM

## 2014-05-31 DIAGNOSIS — Z202 Contact with and (suspected) exposure to infections with a predominantly sexual mode of transmission: Secondary | ICD-10-CM | POA: Insufficient documentation

## 2014-05-31 DIAGNOSIS — Z720 Tobacco use: Secondary | ICD-10-CM | POA: Insufficient documentation

## 2014-05-31 DIAGNOSIS — Z711 Person with feared health complaint in whom no diagnosis is made: Secondary | ICD-10-CM

## 2014-05-31 DIAGNOSIS — Z8619 Personal history of other infectious and parasitic diseases: Secondary | ICD-10-CM | POA: Insufficient documentation

## 2014-05-31 DIAGNOSIS — N760 Acute vaginitis: Secondary | ICD-10-CM

## 2014-05-31 DIAGNOSIS — Z862 Personal history of diseases of the blood and blood-forming organs and certain disorders involving the immune mechanism: Secondary | ICD-10-CM | POA: Insufficient documentation

## 2014-05-31 DIAGNOSIS — Z3202 Encounter for pregnancy test, result negative: Secondary | ICD-10-CM | POA: Insufficient documentation

## 2014-05-31 LAB — WET PREP, GENITAL
Trich, Wet Prep: NONE SEEN
Yeast Wet Prep HPF POC: NONE SEEN

## 2014-05-31 LAB — POC URINE PREG, ED: Preg Test, Ur: NEGATIVE

## 2014-05-31 MED ORDER — METRONIDAZOLE 500 MG PO TABS: 500.0000 mg | ORAL_TABLET | Freq: Two times a day (BID) | ORAL | Status: DC

## 2014-05-31 MED ORDER — AZITHROMYCIN 250 MG PO TABS
1000.0000 mg | ORAL_TABLET | Freq: Once | ORAL | Status: AC
  Administered 2014-05-31: 1000 mg via ORAL
  Filled 2014-05-31: qty 4

## 2014-05-31 MED ORDER — CEFTRIAXONE SODIUM 250 MG IJ SOLR
250.0000 mg | Freq: Once | INTRAMUSCULAR | Status: AC
  Administered 2014-05-31: 250 mg via INTRAMUSCULAR
  Filled 2014-05-31: qty 250

## 2014-05-31 NOTE — ED Provider Notes (Signed)
CSN: [id number]     Arrival date & time 05/31/14  2009 History   First MD Initiated Contact with Patient 05/31/14 2131     No chief complaint on file.    (Consider location/radiation/quality/duration/timing/severity/associated sxs/prior Treatment) HPI  Pt is a 29yo female with hx of chlamydia, trichomonas, BV, anemia and headaches, presenting to ED with c/o vaginal irritation x2 days.  Pt states she had intercourse on New Years Eve but states she blacked out and does not recall if a condom was used.  States she did see one condom on the ground but is unsure if she had sex multiple times. Pt reports vaginal itching and discomfort with small amount of discharge. Denies pain or vaginal bleeding. Denies fever, n/v/d. States she has a f/u appointment with her PCP on the 20th but is worried and does not want to wait that long. Pt is not on birth control. LMP: "end of last month."  Denies hx of abdominal surgeries. No other significant PMH.  Past Medical History  Diagnosis Date  . Chlamydia   . Trichomonas   . BV (bacterial vaginosis)   . Anemia   . WUJWJXBJ(478.2)    Past Surgical History  Procedure Laterality Date  . No past surgeries     Family History  Problem Relation Age of Onset  . Hypertension Mother   . Anxiety disorder Mother   . Asthma Sister   . Asthma Daughter   . Asthma Son   . Cancer Maternal Aunt    History  Substance Use Topics  . Smoking status: Current Every Day Smoker -- 0.10 packs/day    Types: Cigarettes  . Smokeless tobacco: Never Used     Comment: quit with preg  . Alcohol Use: Yes     Comment: not with preg   OB History    Gravida Para Term Preterm AB TAB SAB Ectopic Multiple Living   0 0 0 0 0 0 4     Review of Systems  Constitutional: Negative for chills and fatigue.  Gastrointestinal: Negative for nausea, vomiting, abdominal pain and diarrhea.  Genitourinary: Positive for vaginal discharge and vaginal pain ( "discomfort, itching"). Negative  for dysuria, frequency, flank pain, menstrual problem and pelvic pain.  All other systems reviewed and are negative.     Allergies  Review of patient's allergies indicates no known allergies.  Home Medications   Prior to Admission medications   Medication Sig Start Date End Date Taking? Authorizing Provider  acetaminophen (TYLENOL) 500 MG tablet Take 1,500 mg by mouth every 6 (six) hours as needed for headache.     Historical Provider, MD  dextromethorphan-guaiFENesin (ROBITUSSIN COLD COUGH+ CHEST) 10-100 MG/5ML liquid Take 5 mLs by mouth every 4 (four) hours as needed for cough.    Historical Provider, MD  ibuprofen (ADVIL,MOTRIN) 600 MG tablet Take 1 tablet (600 mg total) by mouth every 6 (six) hours as needed. Patient taking differently: Take 600 mg by mouth every 6 (six) hours as needed for headache.  02/21/14   Shon Baton, MD  metroNIDAZOLE (FLAGYL) 500 MG tablet Take 1 tablet (500 mg total) by mouth 2 (two) times daily. One po bid x 7 days 05/31/14   Junius Finner, PA-C  ondansetron (ZOFRAN) 4 MG tablet Take 1 tablet (4 mg total) by mouth every 8 (eight) hours as needed for nausea or vomiting. 03/25/14   Fredirick Lathe, MD  Prenatal Vit-Fe Fumarate-FA (PRENATAL MULTIVITAMIN) TABS tablet Take 1 tablet by mouth daily  at 12 noon.    Historical Provider, MD   BP 119/75 mmHg  Pulse 86  Temp(Src) 98.1 F (36.7 C) (Oral)  Resp 20  SpO2 98% Physical Exam  Constitutional: She appears well-developed and well-nourished. No distress.  HENT:  Head: Normocephalic and atraumatic.  Eyes: Conjunctivae are normal. No scleral icterus.  Neck: Normal range of motion.  Cardiovascular: Normal rate, regular rhythm and normal heart sounds.   Pulmonary/Chest: Effort normal and breath sounds normal. No respiratory distress. She has no wheezes. She has no rales. She exhibits no tenderness.  Abdominal: Soft. Bowel sounds are normal. She exhibits no distension and no mass. There is no tenderness.  There is no rebound and no guarding.  Soft, non-distended, non-tender.  Genitourinary:  Chaperoned exam: normal external genitalia. Vaginal canal: small amount white discharge. No vaginal bleeding. No CMT, adnexal tenderness or masses.  Musculoskeletal: Normal range of motion.  Neurological: She is alert.  Skin: Skin is warm and dry. She is not diaphoretic.  Nursing note and vitals reviewed.   ED Course  Procedures (including critical care time) Labs Review Labs Reviewed  WET PREP, GENITAL - Abnormal; Notable for the following:    Clue Cells Wet Prep HPF POC MANY (*)    WBC, Wet Prep HPF POC MANY (*)    All other components within normal limits  GC/CHLAMYDIA PROBE AMP  HIV ANTIBODY (ROUTINE TESTING)  RPR  POC URINE PREG, ED    Imaging Review No results found.   EKG Interpretation None      MDM   Final diagnoses:  BV (bacterial vaginosis)  Concern about STD in female without diagnosis    Pt c/o vaginal discomfort, concerned for STDs.   Urine preg: negative. Pelvic exam: significant for vaginal discharge. No CMT, adnexal tenderness or masses. Not concerned for ectopic pregnancy, ovarian torsion or TOA.  Wet prep: significant for many clue cells. Will tx for BV with flagyl x7 days. STD panel set labs pending. Pt tx empirically with azithromycin and rocephin. Home care instructions provided including advised to have all partners tested and tx. Advised to f/u with PCP as previously scheduled for symptom recheck. Return precautions provided. Pt verbalized understanding and agreement with tx plan.   Junius Finnerrin O'Malley, PA-C 05/31/14 2246  Tilden FossaElizabeth Rees, MD 05/31/14 [phone]

## 2014-05-31 NOTE — ED Notes (Signed)
Pt. reports vaginal irritation with mild vaginal discharge onset this week , denies fever , no dysuria .

## 2014-05-31 NOTE — Discharge Instructions (Signed)
Refrain from sexual intercourse for 7 days. Be sure to have all partners tested and treated for STDs.  This may be performed by your primary care provider or the health department.  Practice safe sex by always using condoms.  ° °

## 2014-06-02 LAB — GC/CHLAMYDIA PROBE AMP
CT Probe RNA: NEGATIVE
GC Probe RNA: NEGATIVE

## 2014-06-02 LAB — RPR: RPR Ser Ql: NONREACTIVE

## 2014-06-02 LAB — HIV ANTIBODY (ROUTINE TESTING W REFLEX)
HIV 1/O/2 Abs-Index Value: <1 (ref <1.00)
HIV-1/HIV-2 Ab: NONREACTIVE

## 2014-06-10 ENCOUNTER — Ambulatory Visit: Payer: Self-pay | Admitting: Family Medicine

## 2014-07-13 ENCOUNTER — Ambulatory Visit (INDEPENDENT_AMBULATORY_CARE_PROVIDER_SITE_OTHER): Payer: Medicaid Other | Admitting: Family Medicine

## 2014-07-13 ENCOUNTER — Encounter: Payer: Self-pay | Admitting: Family Medicine

## 2014-07-13 VITALS — BP 108/64 | HR 72 | Temp 98.4°F | Wt 157.0 lb

## 2014-07-13 DIAGNOSIS — Z30018 Encounter for initial prescription of other contraceptives: Secondary | ICD-10-CM

## 2014-07-13 DIAGNOSIS — Z30019 Encounter for initial prescription of contraceptives, unspecified: Secondary | ICD-10-CM

## 2014-07-13 DIAGNOSIS — Z3040 Encounter for surveillance of contraceptives, unspecified: Secondary | ICD-10-CM

## 2014-07-13 LAB — POCT URINE PREGNANCY: Preg Test, Ur: NEGATIVE

## 2014-07-13 MED ORDER — ETONOGESTREL 68 MG ~~LOC~~ IMPL
68.0000 mg | DRUG_IMPLANT | Freq: Once | SUBCUTANEOUS | Status: AC
  Administered 2014-07-13: 68 mg via SUBCUTANEOUS

## 2014-07-13 NOTE — Assessment & Plan Note (Addendum)
Desires no pregnancy with h/o tobacco use, though quit Feb 14th 2016. Also with h/o reported migraines but no aura. Discussed mild increase in VTE risk. Also, risk of pregnancy must be considered in pt who desires not to be pregnant and who has h/o postpartum bleeding and high-risk sexual behavior. Neg clinic pregnancy test. - Inserted nexplanon (see procedure note) and discussed removal date/card given; pt palpated nexplanon. Discussed warning signs for infection or VTE. - Discussed potential side effects but that this is a great birth control method. - Congratulated on smoking cessation and encouraged to stay off due to slightly increased risk of VTE in smokers.

## 2014-07-13 NOTE — Patient Instructions (Signed)
Good to see you today. We have inserted the nexplanon. This should be removed in 3 years. Watch out for any signs of infection or uncontrolled bleeding.  Also watch out for any signs of clots- swollen legs, trouble breathing. Stay off cigarettes. Congratulations! Follow up if any issues develop.  Best,  Erika SingletonMaria T Jhair Witherington, MD

## 2014-07-13 NOTE — Progress Notes (Signed)
Patient ID: Erika Porter, female   DOB: [birth year], 29 y.o.   MRN: [id number] Subjective:   CC: Nexplanon placement  HPI:   Here for implanon insertion. Reports that she would like to prevent pregnancy. Has four children at this time. Has been using depo shot but missed last 1-2 shots. Has no history or FH of VTE or stroke. Gets migraines but denies auras. Quit smoking on valentine's day. Denies fevers, chills, or recent illness.  Review of Systems - Per HPI.   PMH - Tobacco and substance use, h/o hyperthyroidism, hidradenitis suppurativa Smoking status: Quit Feb 14th 2016.    Objective:  Physical Exam BP 108/64 mmHg  Pulse 72  Temp(Src) 98.4 F (36.9 C) (Oral)  Wt 157 lb (71.215 kg)  LMP 07/06/2014 (Exact Date)  Breastfeeding? No GEN: NAD Left arm: Skin normal, no erythema, no induration    Procedure note Patient given informed consent, signed copy in the chart, time out was performed. Pregnancy test was negative. Appropriate time out taken.  Patient's left arm was prepped and draped in the usual sterile fashion.. The ruler used to measure and mark insertion area.  Pt was prepped with betadyne and alcohol swab and then injected with 3 cc of 1% lidocaine with epinephrine.  Implanon removed form packaging,  Device confirmed in needle, then inserted full length of needle and withdrawn per handbook instructions.  Pt insertion site covered with clean dressing.   Minimal blood loss.  Pt tolerated the procedure well.   Assessment:     Erika Porter is a 29 y.o. female here for nexplanon insertion.    Plan:     # See problem list and after visit summary for problem-specific plans.   Follow-up: Follow up PRN for any signs or symptoms of infection or discomfort with nexplanon.      Leona SingletonMaria T Mynor Witkop, MD [facility]

## 2014-07-13 NOTE — Progress Notes (Signed)
Reviewed

## 2014-08-04 ENCOUNTER — Encounter (HOSPITAL_COMMUNITY): Payer: Self-pay | Admitting: Emergency Medicine

## 2014-08-04 ENCOUNTER — Emergency Department (HOSPITAL_COMMUNITY)
Admission: EM | Admit: 2014-08-04 | Discharge: 2014-08-04 | Disposition: A | Discharge status: Home / Self Care | Payer: Medicaid Other | Attending: Emergency Medicine | Admitting: Emergency Medicine

## 2014-08-04 ENCOUNTER — Emergency Department (HOSPITAL_COMMUNITY): Payer: Medicaid Other

## 2014-08-04 DIAGNOSIS — K0889 Other specified disorders of teeth and supporting structures: Secondary | ICD-10-CM

## 2014-08-04 DIAGNOSIS — Z793 Long term (current) use of hormonal contraceptives: Secondary | ICD-10-CM | POA: Insufficient documentation

## 2014-08-04 DIAGNOSIS — Z8619 Personal history of other infectious and parasitic diseases: Secondary | ICD-10-CM | POA: Insufficient documentation

## 2014-08-04 DIAGNOSIS — N898 Other specified noninflammatory disorders of vagina: Secondary | ICD-10-CM | POA: Insufficient documentation

## 2014-08-04 DIAGNOSIS — R0789 Other chest pain: Secondary | ICD-10-CM | POA: Diagnosis not present

## 2014-08-04 DIAGNOSIS — Z7982 Long term (current) use of aspirin: Secondary | ICD-10-CM | POA: Diagnosis not present

## 2014-08-04 DIAGNOSIS — R05 Cough: Secondary | ICD-10-CM | POA: Diagnosis not present

## 2014-08-04 DIAGNOSIS — Z87891 Personal history of nicotine dependence: Secondary | ICD-10-CM | POA: Diagnosis not present

## 2014-08-04 DIAGNOSIS — Z862 Personal history of diseases of the blood and blood-forming organs and certain disorders involving the immune mechanism: Secondary | ICD-10-CM | POA: Insufficient documentation

## 2014-08-04 DIAGNOSIS — Z79899 Other long term (current) drug therapy: Secondary | ICD-10-CM | POA: Insufficient documentation

## 2014-08-04 DIAGNOSIS — R0981 Nasal congestion: Secondary | ICD-10-CM | POA: Insufficient documentation

## 2014-08-04 DIAGNOSIS — Z3202 Encounter for pregnancy test, result negative: Secondary | ICD-10-CM | POA: Insufficient documentation

## 2014-08-04 DIAGNOSIS — D649 Anemia, unspecified: Secondary | ICD-10-CM | POA: Diagnosis not present

## 2014-08-04 DIAGNOSIS — K088 Other specified disorders of teeth and supporting structures: Secondary | ICD-10-CM | POA: Insufficient documentation

## 2014-08-04 DIAGNOSIS — R059 Cough, unspecified: Secondary | ICD-10-CM

## 2014-08-04 DIAGNOSIS — K002 Abnormalities of size and form of teeth: Secondary | ICD-10-CM | POA: Insufficient documentation

## 2014-08-04 DIAGNOSIS — K023 Arrested dental caries: Secondary | ICD-10-CM | POA: Insufficient documentation

## 2014-08-04 LAB — URINALYSIS, ROUTINE W REFLEX MICROSCOPIC
Bilirubin Urine: NEGATIVE
Glucose, UA: NEGATIVE mg/dL
Hgb urine dipstick: NEGATIVE
Ketones, ur: 15 mg/dL — AB
Nitrite: NEGATIVE
Protein, ur: NEGATIVE mg/dL
Specific Gravity, Urine: 1.03 (ref 1.005–1.030)
Urobilinogen, UA: 1 mg/dL (ref 0.0–1.0)
pH: 6 (ref 5.0–8.0)

## 2014-08-04 LAB — WET PREP, GENITAL
Clue Cells Wet Prep HPF POC: NONE SEEN
Trich, Wet Prep: NONE SEEN
YEAST WET PREP: NONE SEEN

## 2014-08-04 LAB — URINE MICROSCOPIC-ADD ON

## 2014-08-04 LAB — POC URINE PREG, ED: Preg Test, Ur: NEGATIVE

## 2014-08-04 MED ORDER — CEFTRIAXONE SODIUM 250 MG IJ SOLR
250.0000 mg | Freq: Once | INTRAMUSCULAR | Status: AC
  Administered 2014-08-04: 250 mg via INTRAMUSCULAR
  Filled 2014-08-04: qty 250

## 2014-08-04 MED ORDER — HYDROCODONE-HOMATROPINE 5-1.5 MG/5ML PO SYRP
5.0000 mL | ORAL_SOLUTION | Freq: Once | ORAL | Status: DC
  Filled 2014-08-04: qty 5

## 2014-08-04 MED ORDER — LIDOCAINE HCL (PF) 1 % IJ SOLN
INTRAMUSCULAR | Status: AC
  Administered 2014-08-04: 1 mL
  Filled 2014-08-04: qty 5

## 2014-08-04 MED ORDER — AMOXICILLIN 500 MG PO CAPS: 500.0000 mg | ORAL_CAPSULE | Freq: Three times a day (TID) | ORAL | Status: DC

## 2014-08-04 MED ORDER — AZITHROMYCIN 250 MG PO TABS
1000.0000 mg | ORAL_TABLET | Freq: Once | ORAL | Status: AC
  Administered 2014-08-04: 1000 mg via ORAL
  Filled 2014-08-04: qty 4

## 2014-08-04 MED ORDER — HYDROCODONE-ACETAMINOPHEN 5-325 MG PO TABS: 1.0000 | ORAL_TABLET | ORAL | Status: DC | PRN

## 2014-08-04 MED ORDER — GUAIFENESIN 100 MG/5ML PO LIQD: 100.0000 mg | ORAL | Status: DC | PRN

## 2014-08-04 NOTE — ED Notes (Signed)
Erika Spitzhaperoned Jennifer, PA with pelvic examination and sent specimens to lab for testing

## 2014-08-04 NOTE — ED Provider Notes (Signed)
CSN: [id number]     Arrival date & time 08/04/14  [id number] History   First MD Initiated Contact with Patient 08/04/14 0818     Chief Complaint  Patient presents with  . Generalized Body Aches  . Cough  . Vaginal Discharge  . Dental Pain     (Consider location/radiation/quality/duration/timing/severity/associated sxs/prior Treatment) HPI Comments: This is a 29 year old female past medical history significant for positive STD screening, anemia, and he presented to the emergency department for multiple complaints. Her first complaint is one week of vaginal discharge with irritation. She does endorse recent unprotected sexual intercourse. Denies any abdominal or pelvic pain. Patient is also complaining of one year of bilateral upper and lower dental pain that she attributes to her wisdom teeth coming in. She states she has tried over-the-counter medications with no improvement. She states she has been unable to get in to be seen by a dentist and her insurance. Patient is also complaining of a cough with posttussive chest tightness with associated congestion. No medication chart prior to arrival. No modifying factors identified for any of her complaints. No abdominal surgical history.  Patient is a 29 y.o. female presenting with cough, vaginal discharge, and tooth pain.  Cough Associated symptoms: no chills and no fever   Vaginal Discharge Associated symptoms: no abdominal pain, no fever, no nausea and no vomiting   Dental Pain Associated symptoms: congestion   Associated symptoms: no fever     Past Medical History  Diagnosis Date  . Chlamydia   . Trichomonas   . BV (bacterial vaginosis)   . Anemia   . NFAOZHYQ(657.8Headache(784.0)    Past Surgical History  Procedure Laterality Date  . No past surgeries     Family History  Problem Relation Age of Onset  . Hypertension Mother   . Anxiety disorder Mother   . Asthma Sister   . Asthma Daughter   . Asthma Son   . Cancer Maternal Aunt    History   Substance Use Topics  . Smoking status: Former Smoker -- 0.10 packs/day    Types: Cigarettes    Quit date: 07/03/2014  . Smokeless tobacco: Never Used     Comment: quit with preg  . Alcohol Use: 0.0 oz/week    0 Standard drinks or equivalent per week     Comment: not with preg   OB History    Gravida Para Term Preterm AB TAB SAB Ectopic Multiple Living   4 4 4  0 0 0 0 0 0 4     Review of Systems  Constitutional: Negative for fever and chills.  HENT: Positive for congestion and dental problem.   Respiratory: Positive for cough and chest tightness.   Gastrointestinal: Negative for nausea, vomiting, abdominal pain and diarrhea.  Genitourinary: Positive for vaginal discharge. Negative for vaginal bleeding, vaginal pain, menstrual problem and pelvic pain.  All other systems reviewed and are negative.     Allergies  Review of patient's allergies indicates no known allergies.  Home Medications   Prior to Admission medications   Medication Sig Start Date End Date Taking? Authorizing Provider  acetaminophen (TYLENOL) 500 MG tablet Take 1,500 mg by mouth every 6 (six) hours as needed for headache.    Yes Historical Provider, MD  Aspirin-Caffeine (BC FAST PAIN RELIEF PO) Take 2 packets by mouth every 6 (six) hours as needed (tooth pain, headache).   Yes Historical Provider, MD  Etonogestrel (NEXPLANON [place]) Inject into the skin.   Yes Historical Provider, MD  ibuprofen (  ADVIL,MOTRIN) 600 MG tablet Take 1 tablet (600 mg total) by mouth every 6 (six) hours as needed. Patient taking differently: Take 600 mg by mouth every 6 (six) hours as needed for headache.  02/21/14  Yes Shon Baton, MD  Prenatal Vit-Fe Fumarate-FA (PRENATAL MULTIVITAMIN) TABS tablet Take 1 tablet by mouth daily at 12 noon.   Yes Historical Provider, MD  amoxicillin (AMOXIL) 500 MG capsule Take 1 capsule (500 mg total) by mouth 3 (three) times daily. 08/04/14   Mikyah Alamo, PA-C  guaiFENesin (ROBITUSSIN) 100  MG/5ML liquid Take 5-10 mLs (100-200 mg total) by mouth every 4 (four) hours as needed for cough. 08/04/14   Devyn Griffing, PA-C  HYDROcodone-acetaminophen (NORCO/VICODIN) 5-325 MG per tablet Take 1-2 tablets by mouth every 4 (four) hours as needed. 08/04/14   Kymiah Araiza, PA-C  metroNIDAZOLE (FLAGYL) 500 MG tablet Take 1 tablet (500 mg total) by mouth 2 (two) times daily. One po bid x 7 days 05/31/14   Junius Finner, PA-C  ondansetron (ZOFRAN) 4 MG tablet Take 1 tablet (4 mg total) by mouth every 8 (eight) hours as needed for nausea or vomiting. 03/25/14   Donalee Citrin, MD   BP 110/64 mmHg  Pulse 59  Temp(Src) 97.7 F (36.5 C) (Oral)  Resp 18  SpO2 98%  LMP 07/06/2014 (Exact Date) Physical Exam  Constitutional: She is oriented to person, place, and time. She appears well-developed and well-nourished. No distress.  HENT:  Head: Normocephalic and atraumatic.  Right Ear: External ear normal.  Left Ear: External ear normal.  Nose: Nose normal.  Mouth/Throat: Uvula is midline, oropharynx is clear and moist and mucous membranes are normal. No trismus in the jaw. Abnormal dentition. Dental caries present. No uvula swelling. No oropharyngeal exudate.  Submental and sublingual spaces are soft.  Eyes: Conjunctivae are normal.  Neck: Normal range of motion. Neck supple.  Cardiovascular: Normal rate, regular rhythm and normal heart sounds.   Pulmonary/Chest: Effort normal and breath sounds normal. No respiratory distress. She exhibits tenderness.  Abdominal: Soft. Bowel sounds are normal. There is no tenderness.  Musculoskeletal: Normal range of motion. She exhibits no edema.  Neurological: She is alert and oriented to person, place, and time.  Skin: Skin is warm and dry. She is not diaphoretic.  Psychiatric: She has a normal mood and affect.  Nursing note and vitals reviewed.  Exam performed by Francee Piccolo L,  exam chaperoned Date: 08/04/2014 Pelvic exam: normal external  genitalia without evidence of trauma. VULVA: normal appearing vulva with no masses, tenderness or lesion. VAGINA: normal appearing vagina with normal color and discharge, no lesions. CERVIX: normal appearing cervix without lesions, cervical motion tenderness absent, cervical os closed with out purulent discharge; vaginal discharge - white, Wet prep and DNA probe for chlamydia and GC obtained.   ADNEXA: normal adnexa in size, nontender and no masses UTERUS: uterus is normal size, shape, consistency and nontender.   ED Course  Procedures (including critical care time) Medications  HYDROcodone-homatropine (HYCODAN) 5-1.5 MG/5ML syrup 5 mL (5 mLs Oral Not Given 08/04/14 1029)  cefTRIAXone (ROCEPHIN) injection 250 mg (not administered)  azithromycin (ZITHROMAX) tablet 1,000 mg (not administered)  lidocaine (PF) (XYLOCAINE) 1 % injection (not administered)    Labs Review Labs Reviewed  WET PREP, GENITAL - Abnormal; Notable for the following:    WBC, Wet Prep HPF POC MANY (*)    All other components within normal limits  URINALYSIS, ROUTINE W REFLEX MICROSCOPIC - Abnormal; Notable for the following:  APPearance CLOUDY (*)    Ketones, ur 15 (*)    Leukocytes, UA SMALL (*)    All other components within normal limits  URINE MICROSCOPIC-ADD ON - Abnormal; Notable for the following:    Squamous Epithelial / LPF MANY (*)    All other components within normal limits  POC URINE PREG, ED  GC/CHLAMYDIA PROBE AMP ([place])    Imaging Review Dg Chest 2 View  08/04/2014   CLINICAL DATA:  Cough shortness of breath 2 days, subsequent evaluation  EXAM: CHEST  2 VIEW  COMPARISON:  07/30/2012  FINDINGS: Mild levoscoliosis thoracolumbar spine. Heart size and vascular pattern normal. Lungs clear.  IMPRESSION: No active cardiopulmonary disease.   Electronically Signed   By: Esperanza Heir M.D.   On: 08/04/2014 11:00     EKG Interpretation None      MDM   Final diagnoses:  Cough  Pain,  dental  Vaginal discharge    Filed Vitals:   08/04/14 1124  BP: 110/64  Pulse: 59  Temp:   Resp: 18   Afebrile, NAD, non-toxic appearing, AAOx4.   1) Vaginal D/C: Patient to be discharged with instructions to follow up with OBGYN. Pt understands GC/Chlamydia cultures pending and that they will need to inform all sexual partners within the last 6 months if results return positive. Pt has been treated prophylacticly with azithromycin and rocephin due to pts history, pelvic exam, and wet prep with increased WBCs. Pt advised that she will receive a call in 48 hours if the test is positive and to refrain from sexual activity for 48 hours. If the test is positive, pt is advised to refrain from sexual activity for 10 days for the medicine to take effect.  Pt not concerning for PID because hemodynamically stable and no cervical motion tenderness on pelvic exam.   2) URI: Pt CXR negative for acute infiltrate. Patients symptoms are consistent with URI, likely viral etiology. Discussed that antibiotics are not indicated for viral infections. Pt will be discharged with symptomatic treatment.  Verbalizes understanding and is agreeable with plan.   3) Dental Pain: Patient with toothache.  No gross abscess.  Exam unconcerning for Ludwig's angina or spread of infection.  Will treat with amoxil and pain medicine.  Urged patient to follow-up with dentist.    Pt is hemodynamically stable & in NAD prior to dc.          Francee Piccolo, PA-C 08/04/14 1457  Arby Barrette, MD 08/06/14 1530

## 2014-08-04 NOTE — ED Notes (Signed)
Pt c/o body aches and pain with cough and movement; pt sts some dental pain and vaginal discharge

## 2014-08-04 NOTE — Discharge Instructions (Signed)
Please follow up with your primary care physician in 1-2 days. If you do not have one please call the [facility] number listed above. Please follow up with Dr. Leanord AsalFarless to schedule a follow up appointment.  Please follow up with the Ob/Gyn to schedule a follow up appointment. Please read all discharge instructions and return precautions.    Dental Pain A tooth ache may be caused by cavities (tooth decay). Cavities expose the nerve of the tooth to air and hot or cold temperatures. It may come from an infection or abscess (also called a boil or furuncle) around your tooth. It is also often caused by dental caries (tooth decay). This causes the pain you are having. DIAGNOSIS  Your caregiver can diagnose this problem by exam. TREATMENT   If caused by an infection, it may be treated with medications which kill germs (antibiotics) and pain medications as prescribed by your caregiver. Take medications as directed.  Only take over-the-counter or prescription medicines for pain, discomfort, or fever as directed by your caregiver.  Whether the tooth ache today is caused by infection or dental disease, you should see your dentist as soon as possible for further care. SEEK MEDICAL CARE IF: The exam and treatment you received today has been provided on an emergency basis only. This is not a substitute for complete medical or dental care. If your problem worsens or new problems (symptoms) appear, and you are unable to meet with your dentist, call or return to this location. SEEK IMMEDIATE MEDICAL CARE IF:   You have a fever.  You develop redness and swelling of your face, jaw, or neck.  You are unable to open your mouth.  You have severe pain uncontrolled by pain medicine. MAKE SURE YOU:   Understand these instructions.  Will watch your condition.  Will get help right away if you are not doing well or get worse. Document Released: 05/06/2005 Document Revised: 07/29/2011  Document Reviewed: 12/23/2007 [facility] Patient Information 2015 [employer] [place]. This information is not intended to replace advice given to you by your health care provider. Make sure you discuss any questions you have with your health care provider.  Cough, Adult  A cough is a reflex that helps clear your throat and airways. It can help heal the body or may be a reaction to an irritated airway. A cough may only last 2 or 3 weeks (acute) or may last more than 8 weeks (chronic).  CAUSES Acute cough:  Viral or bacterial infections. Chronic cough:  Infections.  Allergies.  Asthma.  Post-nasal drip.  Smoking.  Heartburn or acid reflux.  Some medicines.  Chronic lung problems (COPD).  Cancer. SYMPTOMS   Cough.  Fever.  Chest pain.  Increased breathing rate.  High-pitched whistling sound when breathing (wheezing).  Colored mucus that you cough up (sputum). TREATMENT   A bacterial cough may be treated with antibiotic medicine.  A viral cough must run its course and will not respond to antibiotics.  Your caregiver may recommend other treatments if you have a chronic cough. HOME CARE INSTRUCTIONS   Only take over-the-counter or prescription medicines for pain, discomfort, or fever as directed by your caregiver. Use cough suppressants only as directed by your caregiver.  Use a cold steam vaporizer or humidifier in your bedroom or home to help loosen secretions.  Sleep in a semi-upright position if your cough is worse at night.  Rest as needed.  Stop smoking if you smoke. SEEK IMMEDIATE MEDICAL CARE  IF:   You have pus in your sputum.  Your cough starts to worsen.  You cannot control your cough with suppressants and are losing sleep.  You begin coughing up blood.  You have difficulty breathing.  You develop pain which is getting worse or is uncontrolled with medicine.  You have a fever. MAKE SURE YOU:   Understand these instructions.  Will watch your  condition.  Will get help right away if you are not doing well or get worse. Document Released: 11/02/2010 Document Revised: 07/29/2011 Document Reviewed: 11/02/2010 [facility] Patient Information 2015 [place], [place]. This information is not intended to replace advice given to you by your health care provider. Make sure you discuss any questions you have with your health care provider. Sexually Transmitted Disease A sexually transmitted disease (STD) is a disease or infection that may be passed (transmitted) from person to person, usually during sexual activity. This may happen by way of saliva, semen, blood, vaginal mucus, or urine. Common STDs include:   Gonorrhea.   Chlamydia.   Syphilis.   HIV and AIDS.   Genital herpes.   Hepatitis B and C.   Trichomonas.   Human papillomavirus (HPV).   Pubic lice.   Scabies.  Mites.  Bacterial vaginosis. WHAT ARE CAUSES OF STDs? An STD may be caused by bacteria, a virus, or parasites. STDs are often transmitted during sexual activity if one person is infected. However, they may also be transmitted through nonsexual means. STDs may be transmitted after:   Sexual intercourse with an infected person.   Sharing sex toys with an infected person.   Sharing needles with an infected person or using unclean piercing or tattoo needles.  Having intimate contact with the genitals, mouth, or rectal areas of an infected person.   Exposure to infected fluids during birth. WHAT ARE THE SIGNS AND SYMPTOMS OF STDs? Different STDs have different symptoms. Some people may not have any symptoms. If symptoms are present, they may include:   Painful or bloody urination.   Pain in the pelvis, abdomen, vagina, anus, throat, or eyes.   A skin rash, itching, or irritation.  Growths, ulcerations, blisters, or sores in the genital and anal areas.  Abnormal vaginal discharge with or without bad odor.   Penile discharge in men.    Fever.   Pain or bleeding during sexual intercourse.   Swollen glands in the groin area.   Yellow skin and eyes (jaundice). This is seen with hepatitis.   Swollen testicles.  Infertility.  Sores and blisters in the mouth. HOW ARE STDs DIAGNOSED? To make a diagnosis, your health care provider may:   Take a medical history.   Perform a physical exam.   Take a sample of any discharge to examine.  Swab the throat, cervix, opening to the penis, rectum, or vagina for testing.  Test a sample of your first morning urine.   Perform blood tests.   Perform a Pap test, if this applies.   Perform a colposcopy.   Perform a laparoscopy.  HOW ARE STDs TREATED? Treatment depends on the STD. Some STDs may be treated but not cured.   Chlamydia, gonorrhea, trichomonas, and syphilis can be cured with antibiotic medicine.   Genital herpes, hepatitis, and HIV can be treated, but not cured, with prescribed medicines. The medicines lessen symptoms.   Genital warts from HPV can be treated with medicine or by freezing, burning (electrocautery), or surgery. Warts may come back.   HPV cannot be cured with medicine  or surgery. However, abnormal areas may be removed from the cervix, vagina, or vulva.   If your diagnosis is confirmed, your recent sexual partners need treatment. This is true even if they are symptom-free or have a negative culture or evaluation. They should not have sex until their health care providers say it is okay. HOW CAN I REDUCE MY RISK OF GETTING AN STD? Take these steps to reduce your risk of getting an STD:  Use latex condoms, dental dams, and water-soluble lubricants during sexual activity. Do not use petroleum jelly or oils.  Avoid having multiple sex partners.  Do not have sex with someone who has other sex partners.  Do not have sex with anyone you do not know or who is at high risk for an STD.  Avoid risky sex practices that can break your  skin.  Do not have sex if you have open sores on your mouth or skin.  Avoid drinking too much alcohol or taking illegal drugs. Alcohol and drugs can affect your judgment and put you in a vulnerable position.  Avoid engaging in oral and anal sex acts.  Get vaccinated for HPV and hepatitis. If you have not received these vaccines in the past, talk to your health care provider about whether one or both might be right for you.   If you are at risk of being infected with HIV, it is recommended that you take a prescription medicine daily to prevent HIV infection. This is called pre-exposure prophylaxis (PrEP). You are considered at risk if:  You are a man who has sex with other men (MSM).  You are a heterosexual man or woman and are sexually active with more than one partner.  You take drugs by injection.  You are sexually active with a partner who has HIV.  Talk with your health care provider about whether you are at high risk of being infected with HIV. If you choose to begin PrEP, you should first be tested for HIV. You should then be tested every 3 months for as long as you are taking PrEP.  WHAT SHOULD I DO IF I THINK I HAVE AN STD?  See your health care provider.   Tell your sexual partner(s). They should be tested and treated for any STDs.  Do not have sex until your health care provider says it is okay. WHEN SHOULD I GET IMMEDIATE MEDICAL CARE? Contact your health care provider right away if:   You have severe abdominal pain.  You are a man and notice swelling or pain in your testicles.  You are a woman and notice swelling or pain in your vagina. Document Released: 07/27/2002 Document Revised: 05/11/2013 Document Reviewed: 11/24/2012 [facility] Patient Information 2015 [place], [place]. This information is not intended to replace advice given to you by your health care provider. Make sure you discuss any questions you have with your health care provider.

## 2014-08-05 ENCOUNTER — Ambulatory Visit (INDEPENDENT_AMBULATORY_CARE_PROVIDER_SITE_OTHER): Payer: Medicaid Other | Admitting: Family Medicine

## 2014-08-05 ENCOUNTER — Ambulatory Visit: Payer: Self-pay | Admitting: Family Medicine

## 2014-08-05 VITALS — BP 107/72 | HR 80 | Temp 98.3°F | Wt 154.0 lb

## 2014-08-05 DIAGNOSIS — L299 Pruritus, unspecified: Secondary | ICD-10-CM

## 2014-08-05 LAB — GC/CHLAMYDIA PROBE AMP (~~LOC~~) NOT AT ARMC
Chlamydia: NEGATIVE
NEISSERIA GONORRHEA: NEGATIVE

## 2014-08-05 MED ORDER — CETIRIZINE HCL 10 MG PO TABS: 10.0000 mg | ORAL_TABLET | Freq: Every day | ORAL | Status: DC

## 2014-08-05 NOTE — Patient Instructions (Signed)
I'm not exactly sure what is causing itching, but it is unlikely to be from the Nexplanon. The more you scratch herself, the worse it is going to itch and swell. Stay hydrated with Vaseline and unscented cream or cocoa butter regularly. You can use the cetirizine daily to help with the itching. If it gets really bad, you can try Benadryl cream over-the-counter. Follow-up in one month if it is not better and we can consider doing some lab work. Otherwise, seek immediate care if you have severe worsening of itching or rash, trouble breathing, or any other worrisome changes.

## 2014-08-05 NOTE — Progress Notes (Signed)
Patient ID: Erika Porter, female   DOB: 02-07-1986, 29 y.o.   MRN: [id number] Subjective:   CC: Nexplanon problem  HPI:    Nexplanon placed in left arm 07/13/2014. She reports that 3-4 days later, she started having itching on her legs and arms, and she wondered if it has to do with a Nexplanon. She denies ever having a rash. She started getting "whelps" in areas that she had scratched hard. She felt she could scratch so hard that she might scratch out the Nexplanon. Soon, itching progressed to her arms and head. She denies fevers or chills, shortness of breath, or any other changes besides some dizziness. She does report not eating well due to stress. She denies any new medications, new skin products, food allergies, possible contacts with similar symptoms, or recent travel.  Review of Systems - Per HPI.  PMH - Tobacco and substance use, h/o hyperthyroidism, hidradenitis suppurativa, allergic rhinitis, hidradenitis suppurativa Smoking status: Quit Feb 14th 2016    Objective:  Physical Exam BP 107/72 mmHg  Pulse 80  Temp(Src) 98.3 F (36.8 C) (Oral)  Wt 154 lb (69.854 kg)  LMP 07/06/2014 (Exact Date)  Breastfeeding? No GEN: NAD Cardiovascular: Regular rate and rhythm by 2+ bilateral radial pulses Abdomen: Soft, nontender, nondistended, no organomegaly Extremities: No lower extremity edema Skin: No rash or cyanosis, mild bruising at left medial upper arm just over the Nexplanon with faint excoriations, but no overlying erythema or induration Otherwise, no rash or cyanosis on arms, legs, neck, face HEENT: Normocephalic, neck supple, no thyromegaly, no lymphadenopathy, oropharynx mildly erythematous Pulmonary: Normal effort    Assessment:     Erika Porter is a 29 y.o. female here for generalized itching.    Plan:     # See problem list and after visit summary for problem-specific plans. - Mild dizziness, thought likely due to poor intake with stress. Discussed eating 3 meals  per day and taking 6-8 glasses of water.    Follow-up: Follow up in 1 month for lack of improvement of itching.   Leona SingletonMaria T Nancy Arvin, MD [facility]

## 2014-08-05 NOTE — Assessment & Plan Note (Signed)
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 No scleral icterus or jaundice. Renal and hepatic function normal 03/2014. - Keep skin hydrated (vaseline, unscented lotion) - Cetirizine rx'ed for daily use. - Topical benadryl prn. - F/u 3-4 weeks PRN no improvement to re-eval. Return precautions for severe rash, dyspnea, other concern.

## 2014-08-06 NOTE — Progress Notes (Signed)
Available preceptor of the day. 

## 2014-10-30 DIAGNOSIS — Z8619 Personal history of other infectious and parasitic diseases: Secondary | ICD-10-CM | POA: Diagnosis not present

## 2014-10-30 DIAGNOSIS — Z87891 Personal history of nicotine dependence: Secondary | ICD-10-CM | POA: Insufficient documentation

## 2014-10-30 DIAGNOSIS — X58XXXA Exposure to other specified factors, initial encounter: Secondary | ICD-10-CM | POA: Diagnosis not present

## 2014-10-30 DIAGNOSIS — Y9289 Other specified places as the place of occurrence of the external cause: Secondary | ICD-10-CM | POA: Insufficient documentation

## 2014-10-30 DIAGNOSIS — Z79899 Other long term (current) drug therapy: Secondary | ICD-10-CM | POA: Insufficient documentation

## 2014-10-30 DIAGNOSIS — N898 Other specified noninflammatory disorders of vagina: Secondary | ICD-10-CM | POA: Insufficient documentation

## 2014-10-30 DIAGNOSIS — Z792 Long term (current) use of antibiotics: Secondary | ICD-10-CM | POA: Insufficient documentation

## 2014-10-30 DIAGNOSIS — Z7982 Long term (current) use of aspirin: Secondary | ICD-10-CM | POA: Diagnosis not present

## 2014-10-30 DIAGNOSIS — D649 Anemia, unspecified: Secondary | ICD-10-CM | POA: Diagnosis not present

## 2014-10-30 DIAGNOSIS — Y998 Other external cause status: Secondary | ICD-10-CM | POA: Insufficient documentation

## 2014-10-30 DIAGNOSIS — Y9389 Activity, other specified: Secondary | ICD-10-CM | POA: Insufficient documentation

## 2014-10-30 DIAGNOSIS — T192XXA Foreign body in vulva and vagina, initial encounter: Secondary | ICD-10-CM | POA: Diagnosis present

## 2014-10-31 ENCOUNTER — Emergency Department (HOSPITAL_COMMUNITY)
Admission: EM | Admit: 2014-10-31 | Discharge: 2014-10-31 | Disposition: A | Discharge status: Home / Self Care | Payer: Medicaid Other | Attending: Emergency Medicine | Admitting: Emergency Medicine

## 2014-10-31 ENCOUNTER — Encounter (HOSPITAL_COMMUNITY): Payer: Self-pay | Admitting: Emergency Medicine

## 2014-10-31 DIAGNOSIS — N898 Other specified noninflammatory disorders of vagina: Secondary | ICD-10-CM

## 2014-10-31 LAB — GC/CHLAMYDIA PROBE AMP (~~LOC~~) NOT AT ARMC
Chlamydia: NEGATIVE
NEISSERIA GONORRHEA: NEGATIVE

## 2014-10-31 LAB — WET PREP, GENITAL
CLUE CELLS WET PREP: NONE SEEN
TRICH WET PREP: NONE SEEN
Yeast Wet Prep HPF POC: NONE SEEN

## 2014-10-31 LAB — POC URINE PREG, ED: PREG TEST UR: NEGATIVE

## 2014-10-31 NOTE — ED Provider Notes (Signed)
CSN: [id number]     Arrival date & time 10/30/14  2336 History   First MD Initiated Contact with Patient 10/31/14 0031     Chief Complaint  Patient presents with  . Foreign Body in Vagina     (Consider location/radiation/quality/duration/timing/severity/associated sxs/prior Treatment) HPI Comments: Patient presents today with a chief complaint of a foreign body in the vagina.  She states that she inserted a sponge into her vagina four days ago while on her menstrual cycle because she wanted to go swimming.  Patient has a similar sponge in her purse today, which appears to be a make up sponge.  She states that she attempted to remove the sponge, but was unable to.  She denies having sexual intercourse since the sponge was inserted.  She does report a small amount of whitish/yellowish colored vaginal discharge that has been present over the past couple of days.  She denies abdominal pain, pelvic pain, nausea, vomiting, fever, dizziness, dysuria, increased urinary frequency, and urgency.    Patient is a 29 y.o. female presenting with foreign body in vagina. The history is provided by the patient.  Foreign Body in Vagina    Past Medical History  Diagnosis Date  . Chlamydia   . Trichomonas   . BV (bacterial vaginosis)   . Anemia   . MVHQIONG(295.2)    Past Surgical History  Procedure Laterality Date  . No past surgeries     Family History  Problem Relation Age of Onset  . Hypertension Mother   . Anxiety disorder Mother   . Asthma Sister   . Asthma Daughter   . Asthma Son   . Cancer Maternal Aunt    History  Substance Use Topics  . Smoking status: Former Smoker -- 0.10 packs/day    Types: Cigarettes    Quit date: 07/03/2014  . Smokeless tobacco: Never Used     Comment: quit with preg  . Alcohol Use: 0.0 oz/week    0 Standard drinks or equivalent per week     Comment: not with preg   OB History    Gravida Para Term Preterm AB TAB SAB Ectopic Multiple Living   0 0 0 0  0 0 4     Review of Systems  All other systems reviewed and are negative.     Allergies  Review of patient's allergies indicates no known allergies.  Home Medications   Prior to Admission medications   Medication Sig Start Date End Date Taking? Authorizing Provider  acetaminophen (TYLENOL) 500 MG tablet Take 1,500 mg by mouth every 6 (six) hours as needed for headache.     Historical Provider, MD  amoxicillin (AMOXIL) 500 MG capsule Take 1 capsule (500 mg total) by mouth 3 (three) times daily. 08/04/14   Jennifer Piepenbrink, PA-C  Aspirin-Caffeine (BC FAST PAIN RELIEF PO) Take 2 packets by mouth every 6 (six) hours as needed (tooth pain, headache).    Historical Provider, MD  cetirizine (ZYRTEC) 10 MG tablet Take 1 tablet (10 mg total) by mouth daily. 08/05/14   Leona Singleton, MD  Etonogestrel [facility]) Inject into the skin.    Historical Provider, MD  guaiFENesin (ROBITUSSIN) 100 MG/5ML liquid Take 5-10 mLs (100-200 mg total) by mouth every 4 (four) hours as needed for cough. 08/04/14   Jennifer Piepenbrink, PA-C  HYDROcodone-acetaminophen (NORCO/VICODIN) 5-325 MG per tablet Take 1-2 tablets by mouth every 4 (four) hours as needed. 08/04/14   Jennifer Piepenbrink, PA-C  ibuprofen (ADVIL,MOTRIN) 600  MG tablet Take 1 tablet (600 mg total) by mouth every 6 (six) hours as needed. Patient taking differently: Take 600 mg by mouth every 6 (six) hours as needed for headache.  02/21/14   Shon Baton, MD  metroNIDAZOLE (FLAGYL) 500 MG tablet Take 1 tablet (500 mg total) by mouth 2 (two) times daily. One po bid x 7 days 05/31/14   Junius Finner, PA-C  ondansetron (ZOFRAN) 4 MG tablet Take 1 tablet (4 mg total) by mouth every 8 (eight) hours as needed for nausea or vomiting. 03/25/14   Donalee Citrin, MD  Prenatal Vit-Fe Fumarate-FA (PRENATAL MULTIVITAMIN) TABS tablet Take 1 tablet by mouth daily at 12 noon.    Historical Provider, MD   BP 131/78 mmHg  Pulse 92  Temp(Src) 98.5 F (36.9  C) (Oral)  Resp 18  Ht 5\' 7"  (1.702 m)  Wt 130 lb (58.968 kg)  BMI 20.36 kg/m2  SpO2 100% Physical Exam  Constitutional: She appears well-developed and well-nourished.  HENT:  Head: Normocephalic and atraumatic.  Mouth/Throat: Oropharynx is clear and moist.  Neck: Normal range of motion. Neck supple.  Cardiovascular: Normal rate, regular rhythm and normal heart sounds.   Pulmonary/Chest: Effort normal and breath sounds normal.  Abdominal: Soft. Bowel sounds are normal. She exhibits no distension and no mass. There is no tenderness. There is no rebound and no guarding.  Genitourinary: Cervix exhibits no motion tenderness. Right adnexum displays no mass, no tenderness and no fullness. Left adnexum displays no mass, no tenderness and no fullness.  No foreign body visualized with speculum exam No foreign body palpated with bimanual exam.  Slight whitish colored discharge in the vaginal vault   Cervical os closed   Neurological: She is alert.  Skin: Skin is warm and dry.  Psychiatric: She has a normal mood and affect.  Nursing note and vitals reviewed.   ED Course  Procedures (including critical care time) Labs Review Labs Reviewed  WET PREP, GENITAL  POC URINE PREG, ED  GC/CHLAMYDIA PROBE AMP ([place]) NOT AT [facility]    Imaging Review No results found.   EKG Interpretation None      MDM   Final diagnoses:  None   Patient presents today with a chief complaint of foreign body of the vagina.  She reports that she inserted a sponge in her vagina 4 days ago.  No evidence of foreign body with speculum or bimanual exam.  No adnexal or CMT with pelvic exam.  Patient is afebrile.  GC/ Chlamydia and Wet prep pending at shift change.  Antony Madura, PA-C will follow up on the results.      Santiago Glad, PA-C 10/31/14 0157  Samuel Jester, DO 11/02/14 [phone]

## 2014-10-31 NOTE — Discharge Instructions (Signed)
There was no evidence of a foreign body on your exam today.  I suspect that the sponge fell out.  The test for Gonorrhea and Chlamydia are pending.  You will be called if it is positive.  Do not insert sponges into the vagina.

## 2014-10-31 NOTE — ED Notes (Signed)
Pt states she put a sponge in her vagina 4 days ago to stop her PMS so that she could go swimming.  States she has been unable to get it out.  Reports she doesn't have menstrual cycles because she has an implant.

## 2015-01-14 ENCOUNTER — Emergency Department (HOSPITAL_COMMUNITY)
Admission: EM | Admit: 2015-01-14 | Discharge: 2015-01-14 | Disposition: A | Discharge status: Home / Self Care | Payer: Medicaid Other | Attending: Emergency Medicine | Admitting: Emergency Medicine

## 2015-01-14 ENCOUNTER — Encounter (HOSPITAL_COMMUNITY): Payer: Self-pay | Admitting: Emergency Medicine

## 2015-01-14 DIAGNOSIS — Z79899 Other long term (current) drug therapy: Secondary | ICD-10-CM | POA: Insufficient documentation

## 2015-01-14 DIAGNOSIS — Z792 Long term (current) use of antibiotics: Secondary | ICD-10-CM | POA: Diagnosis not present

## 2015-01-14 DIAGNOSIS — Z862 Personal history of diseases of the blood and blood-forming organs and certain disorders involving the immune mechanism: Secondary | ICD-10-CM | POA: Insufficient documentation

## 2015-01-14 DIAGNOSIS — R21 Rash and other nonspecific skin eruption: Secondary | ICD-10-CM | POA: Diagnosis present

## 2015-01-14 DIAGNOSIS — Z8742 Personal history of other diseases of the female genital tract: Secondary | ICD-10-CM | POA: Diagnosis not present

## 2015-01-14 DIAGNOSIS — S30860A Insect bite (nonvenomous) of lower back and pelvis, initial encounter: Secondary | ICD-10-CM | POA: Insufficient documentation

## 2015-01-14 DIAGNOSIS — Z8619 Personal history of other infectious and parasitic diseases: Secondary | ICD-10-CM | POA: Diagnosis not present

## 2015-01-14 DIAGNOSIS — Z87891 Personal history of nicotine dependence: Secondary | ICD-10-CM | POA: Insufficient documentation

## 2015-01-14 DIAGNOSIS — Y998 Other external cause status: Secondary | ICD-10-CM | POA: Insufficient documentation

## 2015-01-14 DIAGNOSIS — Y9389 Activity, other specified: Secondary | ICD-10-CM | POA: Insufficient documentation

## 2015-01-14 DIAGNOSIS — Y9289 Other specified places as the place of occurrence of the external cause: Secondary | ICD-10-CM | POA: Diagnosis not present

## 2015-01-14 DIAGNOSIS — W57XXXA Bitten or stung by nonvenomous insect and other nonvenomous arthropods, initial encounter: Secondary | ICD-10-CM | POA: Diagnosis not present

## 2015-01-14 MED ORDER — HYDROCORTISONE 1 % EX CREA: TOPICAL_CREAM | CUTANEOUS | Status: DC

## 2015-01-14 NOTE — ED Notes (Signed)
Pt. Stated, i have this bumps underneath my right cheek for 2 days

## 2015-01-14 NOTE — ED Provider Notes (Signed)
CSN: [id number]     Arrival date & time 01/14/15  0713 History   First MD Initiated Contact with Patient 01/14/15 [phone]     Chief Complaint  Patient presents with  . Rash     (Consider location/radiation/quality/duration/timing/severity/associated sxs/prior Treatment) HPI   Pt p/w pruritic bumps over right buttock for the past several days.  States it started as one bump and is now 4-5.  Also has a single pruritic bump on right ankle.  Denies fever, pain, drainage.    Past Medical History  Diagnosis Date  . Chlamydia   . Trichomonas   . BV (bacterial vaginosis)   . Anemia   . FAOZHYQM(578.4)    Past Surgical History  Procedure Laterality Date  . No past surgeries     Family History  Problem Relation Age of Onset  . Hypertension Mother   . Anxiety disorder Mother   . Asthma Sister   . Asthma Daughter   . Asthma Son   . Cancer Maternal Aunt    Social History  Substance Use Topics  . Smoking status: Former Smoker -- 0.10 packs/day    Types: Cigarettes    Quit date: 07/03/2014  . Smokeless tobacco: Never Used     Comment: quit with preg  . Alcohol Use: 0.0 oz/week    0 Standard drinks or equivalent per week     Comment: not with preg   OB History    Gravida Para Term Preterm AB TAB SAB Ectopic Multiple Living   4 4 4  0 0 0 0 0 0 4     Review of Systems  Constitutional: Negative for fever and chills.  Gastrointestinal: Negative for nausea and vomiting.  Musculoskeletal: Negative for myalgias and neck stiffness.  Skin: Positive for color change. Negative for wound.  Allergic/Immunologic: Negative for immunocompromised state.  Hematological: Does not bruise/bleed easily.  Psychiatric/Behavioral: Negative for self-injury.      Allergies  Review of patient's allergies indicates no known allergies.  Home Medications   Prior to Admission medications   Medication Sig Start Date End Date Taking? Authorizing Provider  acetaminophen (TYLENOL) 500 MG tablet Take  1,500 mg by mouth every 6 (six) hours as needed for headache.     Historical Provider, MD  amoxicillin (AMOXIL) 500 MG capsule Take 1 capsule (500 mg total) by mouth 3 (three) times daily. 08/04/14   Jennifer Piepenbrink, PA-C  Aspirin-Caffeine (BC FAST PAIN RELIEF PO) Take 2 packets by mouth every 6 (six) hours as needed (tooth pain, headache).    Historical Provider, MD  cetirizine (ZYRTEC) 10 MG tablet Take 1 tablet (10 mg total) by mouth daily. 08/05/14   Leona Singleton, MD  Etonogestrel [facility]) Inject into the skin.    Historical Provider, MD  guaiFENesin (ROBITUSSIN) 100 MG/5ML liquid Take 5-10 mLs (100-200 mg total) by mouth every 4 (four) hours as needed for cough. 08/04/14   Jennifer Piepenbrink, PA-C  HYDROcodone-acetaminophen (NORCO/VICODIN) 5-325 MG per tablet Take 1-2 tablets by mouth every 4 (four) hours as needed. 08/04/14   Francee Piccolo, PA-C  hydrocortisone cream 1 % Apply to affected area 2 times daily PRN itching 01/14/15   Trixie Dredge, PA-C  ibuprofen (ADVIL,MOTRIN) 600 MG tablet Take 1 tablet (600 mg total) by mouth every 6 (six) hours as needed. Patient taking differently: Take 600 mg by mouth every 6 (six) hours as needed for headache.  02/21/14   Shon Baton, MD  metroNIDAZOLE (FLAGYL) 500 MG tablet Take 1 tablet (500  mg total) by mouth 2 (two) times daily. One po bid x 7 days 05/31/14   Junius Finner, PA-C  ondansetron (ZOFRAN) 4 MG tablet Take 1 tablet (4 mg total) by mouth every 8 (eight) hours as needed for nausea or vomiting. 03/25/14   Donalee Citrin, MD  Prenatal Vit-Fe Fumarate-FA (PRENATAL MULTIVITAMIN) TABS tablet Take 1 tablet by mouth daily at 12 noon.    Historical Provider, MD   BP 122/74 mmHg  Pulse 77  Temp(Src) 99 F (37.2 C) (Oral)  Resp 20  SpO2 98%  LMP 11/21/2014 Physical Exam  Constitutional: She appears well-developed and well-nourished. No distress.  HENT:  Head: Normocephalic and atraumatic.  Neck: Neck supple.   Pulmonary/Chest: Effort normal.  Neurological: She is alert.  Skin: She is not diaphoretic.  Cluster of erythematous flattened papules over right buttock, consistent with insect bites.  NO edema, warmth, erythema, drainage.  No induration or fluctuance.  Right ankle with single papule vs blister.  No erythema, edema, warmth, discharge, or tenderness   Nursing note and vitals reviewed.   ED Course  Procedures (including critical care time) Labs Review Labs Reviewed - No data to display  Imaging Review No results found. I have personally reviewed and evaluated these images and lab results as part of my medical decision-making.   EKG Interpretation None      MDM   Final diagnoses:  Insect bites   Afebrile, nontoxic patient with what appear to be mosquito or other insect bites.  No e/o infection.   D/C home with hydrocortisone cream prn.  Discussed result, findings, treatment, and follow up  with patient.  Pt given return precautions.  Pt verbalizes understanding and agrees with plan.         [place], PA-C 01/14/15 1610  Alvira Monday, MD 01/16/15 [phone]

## 2015-01-14 NOTE — Discharge Instructions (Signed)
Read the information below.  Use the prescribed medication as directed.  Please discuss all new medications with your pharmacist.  You may return to the Emergency Department at any time for worsening condition or any new symptoms that concern you.   If you develop redness, swelling, pus draining from the wound, or fevers greater than 100.4, return to the ER immediately for a recheck.     Insect Bite Mosquitoes, flies, fleas, bedbugs, and other insects can bite. Insect bites are different from insect stings. The bite may be red, puffy (swollen), and itchy for 2 to 4 days. Most bites get better on their own. HOME CARE   Do not scratch the bite.  Keep the bite clean and dry. Wash the bite with soap and water.  Put ice on the bite.  Put ice in a plastic bag.  Place a towel between your skin and the bag.  Leave the ice on for 20 minutes, 4 times a day. Do this for the first 2 to 3 days, or as told by your doctor.  You may use medicated lotions or creams to lessen itching as told by your doctor.  Only take medicines as told by your doctor.  If you are given medicines (antibiotics), take them as told. Finish them even if you start to feel better. You may need a tetanus shot if:  You cannot remember when you had your last tetanus shot.  You have never had a tetanus shot.  The injury broke your skin. If you need a tetanus shot and you choose not to have one, you may get tetanus. Sickness from tetanus can be serious. GET HELP RIGHT AWAY IF:   You have more pain, redness, or puffiness.  You see a red line on the skin coming from the bite.  You have a fever.  You have joint pain.  You have a headache or neck pain.  You feel weak.  You have a rash.  You have chest pain, or you are short of breath.  You have belly (abdominal) pain.  You feel sick to your stomach (nauseous) or throw up (vomit).  You feel very tired or sleepy. MAKE SURE YOU:   Understand these  instructions.  Will watch your condition.  Will get help right away if you are not doing well or get worse. Document Released: 05/03/2000 Document Revised: 07/29/2011 Document Reviewed: 12/05/2010 [facility] Patient Information 2015 [place], [place]. This information is not intended to replace advice given to you by your health care provider. Make sure you discuss any questions you have with your health care provider.

## 2015-09-02 ENCOUNTER — Emergency Department (HOSPITAL_COMMUNITY)
Admission: EM | Admit: 2015-09-02 | Discharge: 2015-09-03 | Disposition: A | Discharge status: Home / Self Care | Payer: Medicaid Other | Attending: Emergency Medicine | Admitting: Emergency Medicine

## 2015-09-02 ENCOUNTER — Encounter (HOSPITAL_COMMUNITY): Payer: Self-pay | Admitting: Emergency Medicine

## 2015-09-02 DIAGNOSIS — D649 Anemia, unspecified: Secondary | ICD-10-CM | POA: Diagnosis not present

## 2015-09-02 DIAGNOSIS — Z7982 Long term (current) use of aspirin: Secondary | ICD-10-CM | POA: Insufficient documentation

## 2015-09-02 DIAGNOSIS — K002 Abnormalities of size and form of teeth: Secondary | ICD-10-CM | POA: Insufficient documentation

## 2015-09-02 DIAGNOSIS — K0889 Other specified disorders of teeth and supporting structures: Secondary | ICD-10-CM | POA: Insufficient documentation

## 2015-09-02 DIAGNOSIS — Z792 Long term (current) use of antibiotics: Secondary | ICD-10-CM | POA: Insufficient documentation

## 2015-09-02 DIAGNOSIS — Z7952 Long term (current) use of systemic steroids: Secondary | ICD-10-CM | POA: Insufficient documentation

## 2015-09-02 DIAGNOSIS — Z79899 Other long term (current) drug therapy: Secondary | ICD-10-CM | POA: Diagnosis not present

## 2015-09-02 DIAGNOSIS — Z87891 Personal history of nicotine dependence: Secondary | ICD-10-CM | POA: Diagnosis not present

## 2015-09-02 DIAGNOSIS — Z8619 Personal history of other infectious and parasitic diseases: Secondary | ICD-10-CM | POA: Insufficient documentation

## 2015-09-02 NOTE — ED Notes (Signed)
Pt. reports left lower molar pain onset Wednesday this week unrelieved by prescription Hydrocodone .

## 2015-09-03 NOTE — ED Notes (Signed)
Pt and significant other verbalized understanding of discharge instructions.

## 2015-09-03 NOTE — Discharge Instructions (Signed)
If you are still experiencing pain, you need to call your oral surgeon first thing Monday morning to schedule a follow up appointment. Use your home pain medication as prescribed - This can make you very drowsy - please do not drink or drive on this medication. Your pain medication has tylenol in it, so do not take any additional tylenol. You can use ibuprofen as needed for pain.   Eat a soft or liquid diet and rinse your mouth out after meals with warm water. You should see a dentist or return here at once if you have increased swelling, increased pain or uncontrolled bleeding from the site of your injury.  SEEK MEDICAL CARE IF:   You have swelling around your tooth, in your face or neck.   You have bleeding which starts, continues, or gets worse.   You have a fever >101  If you are unable to open your mouth

## 2015-09-03 NOTE — ED Provider Notes (Signed)
CSN: [id number]     Arrival date & time 09/02/15  2352 History   First MD Initiated Contact with Patient 09/03/15 0001     Chief Complaint  Patient presents with  . Dental Pain     (Consider location/radiation/quality/duration/timing/severity/associated sxs/prior Treatment) Patient is a 30 y.o. female presenting with tooth pain. The history is provided by the patient and medical records. No language interpreter was used.  Dental Pain  Erika Porter is a 30 y.o. female  who presents to the Emergency Department complaining of 10/10 bilateral lower dental pain. Patient states that she had her wisdom teeth taken out on Wednesday (3 days ago) by Dr. Ocie Doyne and has had pain ever since. Patient states she was given Norco for pain with moderate pain relief. She took two (  total) for pain, stating that she experienced severe nausea and itching and does not want this medication. She also states she took two pills from a family member but does not know what the medication was.   Past Medical History  Diagnosis Date  . Chlamydia   . Trichomonas   . BV (bacterial vaginosis)   . Anemia   . WUJWJXBJ(478.2)    Past Surgical History  Procedure Laterality Date  . No past surgeries     Family History  Problem Relation Age of Onset  . Hypertension Mother   . Anxiety disorder Mother   . Asthma Sister   . Asthma Daughter   . Asthma Son   . Cancer Maternal Aunt    Social History  Substance Use Topics  . Smoking status: Former Smoker -- 0.10 packs/day    Types: Cigarettes    Quit date: 07/03/2014  . Smokeless tobacco: Never Used     Comment: quit with preg  . Alcohol Use: 0.0 oz/week    0 Standard drinks or equivalent per week     Comment: not with preg   OB History    Gravida Para Term Preterm AB TAB SAB Ectopic Multiple Living   0 0 0 0 0 0 4     Review of Systems  HENT: Positive for dental problem.   Respiratory: Negative for shortness of breath.   Cardiovascular:  Negative for chest pain.      Allergies  Review of patient's allergies indicates no known allergies.  Home Medications   Prior to Admission medications   Medication Sig Start Date End Date Taking? Authorizing Provider  acetaminophen (TYLENOL) 500 MG tablet Take 1,500 mg by mouth every 6 (six) hours as needed for headache.     Historical Provider, MD  amoxicillin (AMOXIL) 500 MG capsule Take 1 capsule (500 mg total) by mouth 3 (three) times daily. 08/04/14   Jennifer Piepenbrink, PA-C  Aspirin-Caffeine (BC FAST PAIN RELIEF PO) Take 2 packets by mouth every 6 (six) hours as needed (tooth pain, headache).    Historical Provider, MD  cetirizine (ZYRTEC) 10 MG tablet Take 1 tablet (10 mg total) by mouth daily. 08/05/14   Leona Singleton, MD  Etonogestrel [facility]) Inject into the skin.    Historical Provider, MD  guaiFENesin (ROBITUSSIN) 100 MG/5ML liquid Take 5-10 mLs (100-200 mg total) by mouth every 4 (four) hours as needed for cough. 08/04/14   Jennifer Piepenbrink, PA-C  HYDROcodone-acetaminophen (NORCO/VICODIN) 5-325 MG per tablet Take 1-2 tablets by mouth every 4 (four) hours as needed. 08/04/14   Jennifer Piepenbrink, PA-C  hydrocortisone cream 1 % Apply to affected area 2 times daily PRN itching  01/14/15   Trixie DredgeEmily West, PA-C  ibuprofen (ADVIL,MOTRIN) 600 MG tablet Take 1 tablet (600 mg total) by mouth every 6 (six) hours as needed. Patient taking differently: Take 600 mg by mouth every 6 (six) hours as needed for headache.  02/21/14   Shon Batonourtney F Horton, MD  metroNIDAZOLE (FLAGYL) 500 MG tablet Take 1 tablet (500 mg total) by mouth 2 (two) times daily. One po bid x 7 days 05/31/14   Junius FinnerErin O'Malley, PA-C  ondansetron (ZOFRAN) 4 MG tablet Take 1 tablet (4 mg total) by mouth every 8 (eight) hours as needed for nausea or vomiting. 03/25/14   Donalee CitrinAndrew Wong, MD  Prenatal Vit-Fe Fumarate-FA (PRENATAL MULTIVITAMIN) TABS tablet Take 1 tablet by mouth daily at 12 noon.    Historical Provider, MD   BP  120/78 mmHg  Pulse 81  Temp(Src) 98.7 F (37.1 C) (Oral)  Resp 18  Ht 5\' 8"  (1.727 m)  Wt 58.968 kg  BMI 19.77 kg/m2  SpO2 98%  LMP 08/26/2015 (Approximate) Physical Exam  Constitutional: She is oriented to person, place, and time. She appears well-developed and well-nourished.  Alert, speaking in full sentences, and in no acute distress  HENT:  Head: Normocephalic and atraumatic.  Mouth/Throat:    Poor oral dentition noted, midline uvula, no trismus, oropharynx moist and clear, no abscess noted, no oropharyngeal erythema or edema, neck supple and no tenderness. No facial edema  Cardiovascular: Normal rate, regular rhythm and normal heart sounds.  Exam reveals no gallop and no friction rub.   No murmur heard. Pulmonary/Chest: Effort normal and breath sounds normal. No respiratory distress. She has no wheezes. She has no rales.  Abdominal: Soft. Bowel sounds are normal. She exhibits no distension and no mass. There is no tenderness. There is no rebound and no guarding.  Musculoskeletal: Normal range of motion.  Neurological: She is alert and oriented to person, place, and time.  Skin: Skin is warm and dry.  Nursing note and vitals reviewed.   ED Course  Procedures (including critical care time) Labs Review Labs Reviewed - No data to display  Imaging Review No results found. I have personally reviewed and evaluated these images and lab results as part of my medical decision-making.   EKG Interpretation None      MDM   Final diagnoses:  Pain, dental   Erika Porter presents to the ED for dental pain x 3 days ever since she had her wisdom teeth removed. Pain is persistent and unchanged over the last three days.  Patient states she took norco for pain, however experienced significant nausea and itching. Requesting a shot of stronger pain medication for pain relief. On exam, bilateral dental sockets are well healing without signs of infection or dry sockets. Patient is  afebrile and well-appearing speaking in full sentences and handling secretions well. Exam consistent with normal exam 3 days postop with some tooth extraction. Patient informed that the nausea and itching are side effects of pain medication. Recommended taking Benadryl with pain meds to control itching. Take medication with food. Patient informed that she can take ibuprofen for pain instead of narcotic pain medication if side effects of medication outweigh the benefits of pain control. She was informed to follow-up with her oral surgeon if pain persists. Return precautions given, all questions answered.     [facility] [place] CenterJaime Pilcher Ward, PA-C 09/03/15 0106  Mancel BaleElliott Wentz, MD 09/03/15 [phone]

## 2015-10-09 ENCOUNTER — Encounter: Payer: Self-pay | Admitting: Internal Medicine

## 2015-10-26 ENCOUNTER — Encounter: Payer: Self-pay | Admitting: Obstetrics and Gynecology

## 2015-11-20 ENCOUNTER — Ambulatory Visit (INDEPENDENT_AMBULATORY_CARE_PROVIDER_SITE_OTHER): Payer: Medicaid Other | Admitting: Obstetrics and Gynecology

## 2015-11-20 ENCOUNTER — Encounter: Payer: Self-pay | Admitting: Obstetrics and Gynecology

## 2015-11-20 VITALS — BP 113/70 | HR 71 | Temp 98.7°F | Wt 122.0 lb

## 2015-11-20 DIAGNOSIS — B070 Plantar wart: Secondary | ICD-10-CM | POA: Diagnosis not present

## 2015-11-20 DIAGNOSIS — Z319 Encounter for procreative management, unspecified: Secondary | ICD-10-CM

## 2015-11-20 DIAGNOSIS — Z30432 Encounter for removal of intrauterine contraceptive device: Secondary | ICD-10-CM

## 2015-11-20 DIAGNOSIS — Z308 Encounter for other contraceptive management: Secondary | ICD-10-CM

## 2015-11-20 DIAGNOSIS — Z3046 Encounter for surveillance of implantable subdermal contraceptive: Secondary | ICD-10-CM

## 2015-11-20 MED ORDER — PRENATAL VITAMIN 27-0.8 MG PO TABS: 1.0000 | ORAL_TABLET | Freq: Every day | ORAL | Status: DC

## 2015-11-20 MED ORDER — SALICYLIC ACID 6 % EX GEL: Freq: Every day | CUTANEOUS | Status: DC

## 2015-11-20 NOTE — Progress Notes (Deleted)
     Subjective: Chief Complaint  Patient presents with  . Annual Exam     HPI: Erika Porter is a 30 y.o. presenting to clinic today to discuss the following:  30 y.o. year old female presents for well woman/preventative visit and annual GYN examination.  Acute Concerns:  Diet:  Exercise:  Sexual History: Birth history:  LMP:  Birth Control:  POA/Living Will:  Social:  Social History   Social History  . Marital Status: Single    Spouse Name: N/A  . Number of Children: N/A  . Years of Education: N/A   Social History Main Topics  . Smoking status: Current Every Day Smoker -- 0.10 packs/day    Types: Cigarettes    Last Attempt to Quit: 07/03/2014  . Smokeless tobacco: Never Used     Comment: quit with preg  . Alcohol Use: 0.0 oz/week    0 Standard drinks or equivalent per week     Comment: not with preg  . Drug Use: Yes    Special: Marijuana     Comment: Last used yesterday.  Marland Kitchen. Sexual Activity: Yes     Comment: last sexual contact 3 weeks ago   Other Topics Concern  . None   Social History Narrative    Immunization:  Tdap/TD:  Influenza:  Pneumococcal:  Herpes Zoster:  Cancer Screening:  Pap Smear:  Mammogram:   Colonoscopy:  Dexa:  ROS reviewed and were negative unless otherwise noted in HPI.   Physical Exam: VITALS: GEN: HEENT: CARDIAC: RESP: BREAST:Exam performed in the presence of a chaperone.  ABD: GU/GYN:Exam performed in the presence of a chaperone.  EXT: SKIN:  ASSESSMENT & PLAN: 30 y.o. female presents for annual well woman/preventative exam and GYN exam. Please see problem specific assessment and plan.      # # #  Health Maintenance: ***    ROS noted in HPI.  Past Medical, Surgical, Social, and Family History Reviewed & Updated per EMR. Smoking status - ***   Objective: BP 113/70 mmHg  Pulse 71  Temp(Src) 98.7 F (37.1 C) (Oral)  Wt 122 lb (55.339 kg)  SpO2 100%  LMP 11/18/2015 Vitals and nursing  notes reviewed  Physical Exam   No results found for this or any previous visit (from the past 72 hour(s)).  Assessment/Plan: Please see problem based Assessment and Plan  Health Maintainance:   No orders of the defined types were placed in this encounter.    No orders of the defined types were placed in this encounter.     Caryl AdaJazma Phelps, DO 11/20/2015, 11:14 AM PGY-2, [facility]

## 2015-11-20 NOTE — Progress Notes (Signed)
     Subjective: Chief Complaint  Patient presents with  . Contraception  . Foot Problem     HPI: Erika Porter is a 30 y.o. presenting to clinic originally for annual exam but upon agenda setting decided she wanted to discuss the following:  #Nexplanon removal -wants nexplanon removed -states she is trying to get pregnant -was placed about 2 years ago after the birth of her youngest son -sexually active with boyfriend [phone]; wants total of six children -denies any vaginal discharge, odor, pelvic pain, vaginal bleeding  #Foot Concern -has "holes" in bottom of her feet -located on her heels bilaterally -son has similar problem -denies itching or erythema -has been present for a few weeks -she does not like the appearance of it and wants to know what can be done    ROS noted in HPI.  Past Medical, Surgical, Social, and Family History Reviewed & Updated per EMR. Smoking status - Former smoker   Objective: BP 113/70 mmHg  Pulse 71  Temp(Src) 98.7 F (37.1 C) (Oral)  Wt 122 lb (55.339 kg)  SpO2 100%  LMP 11/18/2015 Vitals and nursing notes reviewed  Physical Exam  Constitutional: She is oriented to person, place, and time and well-developed, well-nourished, and in no distress.  Cardiovascular: Normal rate.   Pulmonary/Chest: Effort normal.  Musculoskeletal: Normal range of motion. She exhibits no tenderness.  Nexplanon palpated subdermal on left arm  Neurological: She is alert and oriented to person, place, and time.  Skin: Skin is warm and dry.  Pinhole size lesions noted on heels of bilateral feet. Non-tender, no erythema, no drainage.   Psychiatric: Mood and affect normal.    Assessment/Plan: Please see problem based Assessment and Plan  Health Maintainance: Will reschedule annual exam   Meds ordered this encounter  Medications  . salicylic acid 6 % gel    Sig: Apply topically daily. To affected area.    Dispense:  100 g    Refill:  1  . Prenatal  Vit-Fe Fumarate-FA (PRENATAL VITAMIN) 27-0.8 MG TABS    Sig: Take 1 tablet by mouth daily.    Dispense:  30 tablet    Refill:  6     Caryl AdaJazma Lennie Vasco, DO 11/20/2015, 11:23 AM PGY-3, [facility]

## 2015-11-20 NOTE — Patient Instructions (Addendum)
Nexplanon removed today.  Start taking prenatal vitamins if want to get pregnant. You are not at risk for becoming pregnany Reschedule annual exam Rx for warts given  Plantar Warts Plantar warts are small growths on the bottom of the foot (sole). Warts are caused by a type of germ (virus). Most warts are not painful, and they usually do not cause problems. Sometimes, plantar warts can cause pain when you walk. Warts often go away on their own in time. Treatments may be done if needed. HOME CARE General Instructions  Apply creams or solutions only as told by your doctor. Follow these steps if your doctor tells you to do so:  Soak your foot in warm water.  Remove the top layer of softened skin before you apply the medicine. You can use a pumice stone to remove the tissue.  After you apply the medicine, put a bandage over the area of the wart.  Repeat the process every day or as told by your doctor.  Do not scratch or pick at a wart.  Wash your hands after you touch a wart.  If a wart is painful, try putting a bandage with a hole in the middle over the wart.  Keep all follow-up visits as told by your doctor. This is important. Prevention  Wear shoes and socks. Change socks every day.  Keep your feet clean and dry.  Check your feet often.  Avoid direct contact with warts on other people. GET HELP IF:  Your warts do not improve after treatment.  You have redness, swelling, or pain at the site of a wart.  You have bleeding from a wart, and the bleeding does not stop when you put light pressure on the wart.  You have diabetes and you get a wart.   This information is not intended to replace advice given to you by your health care provider. Make sure you discuss any questions you have with your health care provider.   Document Released: 06/08/2010 Document Revised: 01/25/2015 Document Reviewed: 08/01/2014 Elsevier Interactive Patient Education [employer]2016 Elsevier Inc.

## 2015-11-22 DIAGNOSIS — B070 Plantar wart: Secondary | ICD-10-CM | POA: Insufficient documentation

## 2015-11-22 NOTE — Assessment & Plan Note (Signed)
Lesions on bottom of feet consistent with plantar warts. Rx for salicyclic acid gel to use to help with removal of warts given. Handout also provided on topic.

## 2015-11-22 NOTE — Assessment & Plan Note (Signed)
Patient desires another pregnancy. H/o tobacco use, substance use, and high risk sexual behavior making patient high risk if she becomes pregnant again. Counseled on trying to keep Nexplanon for duration of time but patient wanted it removed. Discussed other forms of birth control which she also declined. Nexplanon was subsequently removed (see separate procedure note). Rx for prenatal vitamins given.

## 2015-11-22 NOTE — Progress Notes (Signed)
PROCEDURE NOTE: Nexplanon Removal  Patient given informed consent for removal of her nexplanon.  Signed copy in the chart.  Appropriate time out taken.  Nexplanon site identified on left arm.  Area prepped in usual sterile fashon.  3 cc of 1% lidocaine with epinephrine was used to anesthetize the area at the distal end of the implant. A small stab incision was made right beside the implant on the distal portion. The nexplanon rod was grasped using hemostats and removed without difficulty.  There was minimal blood loss. There were no complications.  A small amount of antibiotic ointment and steri-strips were applied over the small incision.  A pressure bandage was applied to reduce any bruising.  The patient tolerated the procedure well and was given post procedure instructions.  Erika AdaJazma Phelps, DO PGY-3, [facility]

## 2016-02-10 ENCOUNTER — Encounter (HOSPITAL_COMMUNITY): Payer: Self-pay | Admitting: *Deleted

## 2016-02-10 ENCOUNTER — Inpatient Hospital Stay (HOSPITAL_COMMUNITY)
Admission: AD | Admit: 2016-02-10 | Discharge: 2016-02-10 | Disposition: A | Discharge status: Home / Self Care | Payer: Medicaid Other | Source: Ambulatory Visit | Attending: Obstetrics and Gynecology | Admitting: Obstetrics and Gynecology

## 2016-02-10 DIAGNOSIS — F17210 Nicotine dependence, cigarettes, uncomplicated: Secondary | ICD-10-CM | POA: Insufficient documentation

## 2016-02-10 DIAGNOSIS — O99331 Smoking (tobacco) complicating pregnancy, first trimester: Secondary | ICD-10-CM | POA: Diagnosis not present

## 2016-02-10 DIAGNOSIS — O210 Mild hyperemesis gravidarum: Secondary | ICD-10-CM | POA: Insufficient documentation

## 2016-02-10 DIAGNOSIS — O219 Vomiting of pregnancy, unspecified: Secondary | ICD-10-CM

## 2016-02-10 DIAGNOSIS — Z3A11 11 weeks gestation of pregnancy: Secondary | ICD-10-CM | POA: Diagnosis not present

## 2016-02-10 HISTORY — DX: Unspecified infectious disease: B99.9

## 2016-02-10 LAB — URINE MICROSCOPIC-ADD ON

## 2016-02-10 LAB — URINALYSIS, ROUTINE W REFLEX MICROSCOPIC
Bilirubin Urine: NEGATIVE
Glucose, UA: NEGATIVE mg/dL
Ketones, ur: 15 mg/dL — AB
Leukocytes, UA: NEGATIVE
NITRITE: NEGATIVE
Protein, ur: NEGATIVE mg/dL
SPECIFIC GRAVITY, URINE: 1.025 (ref 1.005–1.030)
pH: 6 (ref 5.0–8.0)

## 2016-02-10 LAB — POCT PREGNANCY, URINE: Preg Test, Ur: POSITIVE — AB

## 2016-02-10 MED ORDER — ONDANSETRON 4 MG PO TBDP: 4.0000 mg | ORAL_TABLET | Freq: Three times a day (TID) | ORAL | 3 refills | Status: DC | PRN

## 2016-02-10 MED ORDER — ONDANSETRON 8 MG PO TBDP
8.0000 mg | ORAL_TABLET | Freq: Once | ORAL | Status: AC
  Administered 2016-02-10: 8 mg via ORAL
  Filled 2016-02-10: qty 1

## 2016-02-10 NOTE — MAU Note (Signed)
Is having what feels like contractions.  Just had implanon removed in  July. Had +PT last wk.  Is concerned about preterm contractions.  Had some bleeding once a wk or so ago, none since

## 2016-02-10 NOTE — MAU Provider Note (Signed)
History   G5P4004 @ 11.3 wks in with nausea and vomiting that has been going on for several days now. Also states stomach is crampy and has been for several days. It is off and on depneding on activity. Denies vag bleeding or vag discharge.  CSN: [id number]  Arrival date & time 02/10/16  1751   None     No chief complaint on file.   HPI  Past Medical History:  Diagnosis Date  . Anemia   . BV (bacterial vaginosis)   . Chlamydia   . Headache(784.0)   . Infection    uti  . Trichomonas     Past Surgical History:  Procedure Laterality Date  . NO PAST SURGERIES      Family History  Problem Relation Age of Onset  . Hypertension Mother   . Anxiety disorder Mother   . Asthma Sister   . Asthma Daughter   . Asthma Son   . Cancer Maternal Aunt   . Heart disease Maternal Grandfather     Social History  Substance Use Topics  . Smoking status: Current Some Day Smoker    Packs/day: 0.10    Years: 4.00    Types: Cigarettes  . Smokeless tobacco: Never Used     Comment: quit with preg  . Alcohol use 0.0 oz/week    OB History    Gravida Para Term Preterm AB Living   5 4 4  0 0 4   SAB TAB Ectopic Multiple Live Births   0 0 0 0 4      Review of Systems  Constitutional: Negative.   HENT: Negative.   Eyes: Negative.   Respiratory: Negative.   Cardiovascular: Negative.   Gastrointestinal: Positive for nausea and vomiting.  Endocrine: Negative.   Genitourinary: Positive for pelvic pain.  Musculoskeletal: Negative.   Skin: Negative.   Allergic/Immunologic: Negative.   Neurological: Negative.   Hematological: Negative.   Psychiatric/Behavioral: Negative.     Allergies  Review of patient's allergies indicates no known allergies.  Home Medications    BP 115/70 (BP Location: Right Arm)   Pulse 77   Temp 98.7 F (37.1 C) (Oral)   Resp 20   Wt 127 lb (57.6 kg)   LMP 11/22/2015   BMI 19.31 kg/m   Physical Exam  Constitutional: She is oriented to person,  place, and time. She appears well-developed and well-nourished.  HENT:  Head: Normocephalic.  Eyes: Pupils are equal, round, and reactive to light.  Neck: Normal range of motion.  Cardiovascular: Normal rate, regular rhythm, normal heart sounds and intact distal pulses.   Pulmonary/Chest: Effort normal and breath sounds normal.  Abdominal: Soft. Bowel sounds are normal.  Genitourinary: Vagina normal and uterus normal.  Musculoskeletal: Normal range of motion.  Neurological: She is alert and oriented to person, place, and time. She has normal reflexes.  Skin: Skin is warm and dry.  Psychiatric: She has a normal mood and affect. Her behavior is normal. Judgment and thought content normal.    MAU Course  Procedures (including critical care time)  Labs Reviewed  URINALYSIS, ROUTINE W REFLEX MICROSCOPIC (NOT AT [facility]) - Abnormal; Notable for the following:       Result Value   Hgb urine dipstick TRACE (*)    Ketones, ur 15 (*)    All other components within normal limits  URINE MICROSCOPIC-ADD ON - Abnormal; Notable for the following:    Squamous Epithelial / LPF 0-5 (*)    Bacteria, UA FEW (*)  All other components within normal limits  POCT PREGNANCY, URINE - Abnormal; Notable for the following:    Preg Test, Ur POSITIVE (*)    All other components within normal limits   No results found.   No diagnosis found.    MDM  FHT [address] and reg with doppler. Urine shows 15 ketones. Will d/c home if retains po's.  Taking fluids well will d/chome

## 2016-02-10 NOTE — Discharge Instructions (Signed)
Morning Sickness °Morning sickness is when you feel sick to your stomach (nauseous) during pregnancy. This nauseous feeling may or may not come with vomiting. It often occurs in the morning but can be a problem any time of day. Morning sickness is most common during the first trimester, but it may continue throughout pregnancy. While morning sickness is unpleasant, it is usually harmless unless you develop severe and continual vomiting (hyperemesis gravidarum). This condition requires more intense treatment.  °CAUSES  °The cause of morning sickness is not completely known but seems to be related to normal hormonal changes that occur in pregnancy. °RISK FACTORS °You are at greater risk if you: °· Experienced nausea or vomiting before your pregnancy. °· Had morning sickness during a previous pregnancy. °· Are pregnant with more than one baby, such as twins. °TREATMENT  °Do not use any medicines (prescription, over-the-counter, or herbal) for morning sickness without first talking to your health care provider. Your health care provider may prescribe or recommend: °· Vitamin B6 supplements. °· Anti-nausea medicines. °· The herbal medicine ginger. °HOME CARE INSTRUCTIONS  °· Only take over-the-counter or prescription medicines as directed by your health care provider. °· Taking multivitamins before getting pregnant can prevent or decrease the severity of morning sickness in most women. °· Eat a piece of dry toast or unsalted crackers before getting out of bed in the morning. °· Eat five or six small meals a day. °· Eat dry and bland foods (rice, baked potato). Foods high in carbohydrates are often helpful. °· Do not drink liquids with your meals. Drink liquids between meals. °· Avoid greasy, fatty, and spicy foods. °· Get someone to [place] for you if the smell of any food causes nausea and vomiting. °· If you feel nauseous after taking prenatal vitamins, take the vitamins at night or with a snack.  °· Snack on protein  foods (nuts, yogurt, cheese) between meals if you are hungry. °· Eat unsweetened gelatins for desserts. °· Wearing an acupressure wristband (worn for sea sickness) may be helpful. °· Acupuncture may be helpful. °· Do not smoke. °· Get a humidifier to keep the air in your house free of odors. °· Get plenty of fresh air. °SEEK MEDICAL CARE IF:  °· Your home remedies are not working, and you need medicine. °· You feel dizzy or lightheaded. °· You are losing weight. °SEEK IMMEDIATE MEDICAL CARE IF:  °· You have persistent and uncontrolled nausea and vomiting. °· You pass out (faint). °MAKE SURE YOU: °· Understand these instructions. °· Will watch your condition. °· Will get help right away if you are not doing well or get worse. °  °This information is not intended to replace advice given to you by your health care provider. Make sure you discuss any questions you have with your health care provider. °  °Document Released: 06/27/2006 Document Revised: 05/11/2013 Document Reviewed: 10/21/2012 °Elsevier Interactive Patient Education ©2016 Elsevier Inc. ° °

## 2016-02-12 ENCOUNTER — Ambulatory Visit: Payer: Self-pay | Admitting: Obstetrics and Gynecology

## 2016-03-13 ENCOUNTER — Inpatient Hospital Stay (HOSPITAL_COMMUNITY)
Admission: AD | Admit: 2016-03-13 | Discharge: 2016-03-13 | Disposition: A | Discharge status: Home / Self Care | Payer: Medicaid Other | Source: Ambulatory Visit | Attending: Obstetrics & Gynecology | Admitting: Obstetrics & Gynecology

## 2016-03-13 ENCOUNTER — Inpatient Hospital Stay (HOSPITAL_COMMUNITY): Payer: Medicaid Other

## 2016-03-13 ENCOUNTER — Encounter (HOSPITAL_COMMUNITY): Payer: Self-pay | Admitting: *Deleted

## 2016-03-13 DIAGNOSIS — O021 Missed abortion: Secondary | ICD-10-CM | POA: Diagnosis not present

## 2016-03-13 DIAGNOSIS — F17210 Nicotine dependence, cigarettes, uncomplicated: Secondary | ICD-10-CM | POA: Diagnosis not present

## 2016-03-13 DIAGNOSIS — O209 Hemorrhage in early pregnancy, unspecified: Secondary | ICD-10-CM | POA: Diagnosis present

## 2016-03-13 DIAGNOSIS — O4692 Antepartum hemorrhage, unspecified, second trimester: Secondary | ICD-10-CM

## 2016-03-13 DIAGNOSIS — Z3A09 9 weeks gestation of pregnancy: Secondary | ICD-10-CM | POA: Diagnosis not present

## 2016-03-13 DIAGNOSIS — O039 Complete or unspecified spontaneous abortion without complication: Secondary | ICD-10-CM

## 2016-03-13 LAB — URINE MICROSCOPIC-ADD ON

## 2016-03-13 LAB — URINALYSIS, ROUTINE W REFLEX MICROSCOPIC
Bilirubin Urine: NEGATIVE
GLUCOSE, UA: NEGATIVE mg/dL
Ketones, ur: 15 mg/dL — AB
LEUKOCYTES UA: NEGATIVE
Nitrite: NEGATIVE
PH: 6.5 (ref 5.0–8.0)
Protein, ur: NEGATIVE mg/dL
SPECIFIC GRAVITY, URINE: 1.025 (ref 1.005–1.030)

## 2016-03-13 LAB — CBC
HCT: 33.6 % — ABNORMAL LOW (ref 36.0–46.0)
Hemoglobin: 11.4 g/dL — ABNORMAL LOW (ref 12.0–15.0)
MCH: 26.8 pg (ref 26.0–34.0)
MCHC: 33.9 g/dL (ref 30.0–36.0)
MCV: 78.9 fL (ref 78.0–100.0)
PLATELETS: 267 10*3/uL (ref 150–400)
RBC: 4.26 MIL/uL (ref 3.87–5.11)
RDW: 14.1 % (ref 11.5–15.5)
WBC: 11.9 10*3/uL — ABNORMAL HIGH (ref 4.0–10.5)

## 2016-03-13 MED ORDER — HYDROCODONE-ACETAMINOPHEN 5-325 MG PO TABS: 1.0000 | ORAL_TABLET | ORAL | 0 refills | Status: DC | PRN

## 2016-03-13 MED ORDER — PROMETHAZINE HCL 25 MG PO TABS: 12.5000 mg | ORAL_TABLET | Freq: Four times a day (QID) | ORAL | 0 refills | Status: DC | PRN

## 2016-03-13 MED ORDER — MISOPROSTOL 200 MCG PO TABS: 800.0000 ug | ORAL_TABLET | Freq: Once | ORAL | 0 refills | Status: DC

## 2016-03-13 MED ORDER — HYDROCODONE-ACETAMINOPHEN 5-325 MG PO TABS
2.0000 | ORAL_TABLET | Freq: Once | ORAL | Status: AC
  Administered 2016-03-13: 2 via ORAL
  Filled 2016-03-13: qty 2

## 2016-03-13 MED ORDER — IBUPROFEN 800 MG PO TABS: 800.0000 mg | ORAL_TABLET | Freq: Three times a day (TID) | ORAL | 0 refills | Status: DC | PRN

## 2016-03-13 NOTE — Discharge Instructions (Signed)
Miscarriage  A miscarriage is the sudden loss of an unborn baby (fetus) before the 20th week of pregnancy. Most miscarriages happen in the first 3 months of pregnancy. Sometimes, it happens before a woman even knows she is pregnant. A miscarriage is also called a "spontaneous miscarriage" or "early pregnancy loss." Having a miscarriage can be an emotional experience. Talk with your caregiver about any questions you may have about miscarrying, the grieving process, and your future pregnancy plans.  CAUSES    Problems with the fetal chromosomes that make it impossible for the baby to develop normally. Problems with the baby's genes or chromosomes are most often the result of errors that occur, by chance, as the embryo divides and grows. The problems are not inherited from the parents.   Infection of the cervix or uterus.    Hormone problems.    Problems with the cervix, such as having an incompetent cervix. This is when the tissue in the cervix is not strong enough to hold the pregnancy.    Problems with the uterus, such as an abnormally shaped uterus, uterine fibroids, or congenital abnormalities.    Certain medical conditions.    Smoking, drinking alcohol, or taking illegal drugs.    Trauma.   Often, the cause of a miscarriage is unknown.   SYMPTOMS    Vaginal bleeding or spotting, with or without cramps or pain.   Pain or cramping in the abdomen or lower back.   Passing fluid, tissue, or blood clots from the vagina.  DIAGNOSIS   Your caregiver will perform a physical exam. You may also have an ultrasound to confirm the miscarriage. Blood or urine tests may also be ordered.  TREATMENT    Sometimes, treatment is not necessary if you naturally pass all the fetal tissue that was in the uterus. If some of the fetus or placenta remains in the body (incomplete miscarriage), tissue left behind may become infected and must be removed. Usually, a dilation and curettage (D and C) procedure is performed.  During a D and C procedure, the cervix is widened (dilated) and any remaining fetal or placental tissue is gently removed from the uterus.   Antibiotic medicines are prescribed if there is an infection. Other medicines may be given to reduce the size of the uterus (contract) if there is a lot of bleeding.   If you have Rh negative blood and your baby was Rh positive, you will need a Rh immunoglobulin shot. This shot will protect any future baby from having Rh blood problems in future pregnancies.  HOME CARE INSTRUCTIONS    Your caregiver may order bed rest or may allow you to continue light activity. Resume activity as directed by your caregiver.   Have someone help with home and family responsibilities during this time.    Keep track of the number of sanitary pads you use each day and how soaked (saturated) they are. Write down this information.    Do not use tampons. Do not douche or have sexual intercourse until approved by your caregiver.    Only take over-the-counter or prescription medicines for pain or discomfort as directed by your caregiver.    Do not take aspirin. Aspirin can cause bleeding.    Keep all follow-up appointments with your caregiver.    If you or your partner have problems with grieving, talk to your caregiver or seek counseling to help cope with the pregnancy loss. Allow enough time to grieve before trying to get pregnant again.     SEEK IMMEDIATE MEDICAL CARE IF:    You have severe cramps or pain in your back or abdomen.   You have a fever.   You pass large blood clots (walnut-sized or larger) ortissue from your vagina. Save any tissue for your caregiver to inspect.    Your bleeding increases.    You have a thick, bad-smelling vaginal discharge.   You become lightheaded, weak, or you faint.    You have chills.   MAKE SURE YOU:   Understand these instructions.   Will watch your condition.   Will get help right away if you are not doing well or get worse.     This  information is not intended to replace advice given to you by your health care provider. Make sure you discuss any questions you have with your health care provider.     Document Released: 10/30/2000 Document Revised: 08/31/2012 Document Reviewed: 06/25/2011  Elsevier Interactive Patient Education 2016 Elsevier Inc.

## 2016-03-13 NOTE — MAU Note (Signed)
Family in with patient. Patient waiting for plan of care.

## 2016-03-13 NOTE — MAU Note (Signed)
Bedside U/S completed. Confirmed no cardiac activity. Patient informed.

## 2016-03-13 NOTE — MAU Note (Addendum)
Spotting since yesterday. This morning when she woke up, her panties were wet with blood. Also has had a heavy vaginal D/c; yellow/white. Vomiting off and on, not everyday. Has a lot of back pain. Unable to hear FHT's with doppler on arrival.

## 2016-03-13 NOTE — MAU Note (Signed)
Urine in lab 

## 2016-03-13 NOTE — MAU Provider Note (Signed)
History     CSN: [id number]  Arrival date and time: 03/13/16 1723   First Provider Initiated Contact with Patient 03/13/16 1809      Chief Complaint  Patient presents with  . Vaginal Bleeding   Erika Porter is a 30 year old G5 P4004 here for vaginal bleeding. She had spotting yesterday morning and then had moderate bright red bleeding on her pad today. She also feels some lower back pain. She was scheduled for a clinic visit on 03/15/2016 but came in today because she was worried. States that she is [redacted] weeks pregnant.    Vaginal Bleeding  The patient's primary symptoms include vaginal bleeding. This is a new problem. The current episode started yesterday. The problem occurs constantly. The problem has been gradually worsening. The pain is mild. She is pregnant. Associated symptoms include abdominal pain. The vaginal bleeding is spotting. She has not been passing clots. She has not been passing tissue. Menstrual history: LMP 11/22/15.    Past Medical History:  Diagnosis Date  . Anemia   . BV (bacterial vaginosis)   . Chlamydia   . Headache(784.0)   . Infection    uti  . Trichomonas     Past Surgical History:  Procedure Laterality Date  . NO PAST SURGERIES      Family History  Problem Relation Age of Onset  . Hypertension Mother   . Anxiety disorder Mother   . Asthma Sister   . Asthma Daughter   . Asthma Son   . Cancer Maternal Aunt   . Heart disease Maternal Grandfather     Social History  Substance Use Topics  . Smoking status: Current Some Day Smoker    Packs/day: 0.10    Years: 4.00    Types: Cigarettes  . Smokeless tobacco: Never Used     Comment: quit with preg  . Alcohol use 0.0 oz/week    Allergies: No Known Allergies  Prescriptions Prior to Admission  Medication Sig Dispense Refill Last Dose  . acetaminophen (TYLENOL) 500 MG tablet Take 1,500 mg by mouth every 6 (six) hours as needed for headache.    Past Week at Unknown time  . Prenatal Vit-Fe  Fumarate-FA (PRENATAL VITAMIN) 27-0.8 MG TABS Take 1 tablet by mouth daily. 30 tablet 6 Past Month at Unknown time  . ondansetron (ZOFRAN ODT) 4 MG disintegrating tablet Take 1 tablet (4 mg total) by mouth every 8 (eight) hours as needed for nausea or vomiting. (Patient not taking: Reported on 03/13/2016) 40 tablet 3 Not Taking at Unknown time  . ondansetron (ZOFRAN) 4 MG tablet Take 1 tablet (4 mg total) by mouth every 8 (eight) hours as needed for nausea or vomiting. (Patient not taking: Reported on 03/13/2016) 10 tablet 0 Not Taking at Unknown time  . salicylic acid 6 % gel Apply topically daily. To affected area. (Patient not taking: Reported on 03/13/2016) 100 g 1 Not Taking at Unknown time    Review of Systems  Constitutional: Negative.   HENT: Negative.   Eyes: Negative.   Respiratory: Negative.   Cardiovascular: Negative.   Gastrointestinal: Positive for abdominal pain.  Genitourinary: Positive for vaginal bleeding.  Musculoskeletal: Negative.   Skin: Negative.   Neurological: Negative.    Physical Exam   Blood pressure 124/75, pulse 80, temperature 98 F (36.7 C), temperature source Oral, resp. rate 16, height 5\' 6"  (1.676 m), weight 57.2 kg (126 lb), last menstrual period 11/22/2015.  Physical Exam  Nursing note and vitals reviewed. Constitutional: She  is oriented to person, place, and time. She appears well-developed.  HENT:  Head: Normocephalic.  Neck: Normal range of motion.  Respiratory: Effort normal.  GI: Soft.  Neurological: She is alert and oriented to person, place, and time.  Skin: Skin is warm and dry.  Psychiatric: She has a normal mood and affect.   Results for orders placed or performed during the hospital encounter of 03/13/16 (from the past 24 hour(s))  Urinalysis, Routine w reflex microscopic (not at [facility])     Status: Abnormal   Collection Time: 03/13/16  5:40 PM  Result Value Ref Range   Color, Urine YELLOW YELLOW   APPearance CLEAR CLEAR    Specific Gravity, Urine 1.025 1.005 - 1.030   pH 6.5 5.0 - 8.0   Glucose, UA NEGATIVE NEGATIVE mg/dL   Hgb urine dipstick LARGE (A) NEGATIVE   Bilirubin Urine NEGATIVE NEGATIVE   Ketones, ur 15 (A) NEGATIVE mg/dL   Protein, ur NEGATIVE NEGATIVE mg/dL   Nitrite NEGATIVE NEGATIVE   Leukocytes, UA NEGATIVE NEGATIVE  Urine microscopic-add on     Status: Abnormal   Collection Time: 03/13/16  5:40 PM  Result Value Ref Range   Squamous Epithelial / LPF 6-30 (A) NONE SEEN   WBC, UA 0-5 0 - 5 WBC/hpf   RBC / HPF 0-5 0 - 5 RBC/hpf   Bacteria, UA FEW (A) NONE SEEN   Urine-Other MUCOUS PRESENT   CBC     Status: Abnormal   Collection Time: 03/13/16  8:03 PM  Result Value Ref Range   WBC 11.9 (H) 4.0 - 10.5 K/uL   RBC 4.26 3.87 - 5.11 MIL/uL   Hemoglobin 11.4 (L) 12.0 - 15.0 g/dL   HCT 16.133.6 (L) 09.636.0 - 04.546.0 %   MCV 78.9 78.0 - 100.0 fL   MCH 26.8 26.0 - 34.0 pg   MCHC 33.9 30.0 - 36.0 g/dL   RDW 40.914.1 81.111.5 - 91.415.5 %   Platelets 267 150 - 400 K/uL   [place] Ob Comp Less 14 Wks  Result Date: 03/13/2016 CLINICAL DATA:  Pregnant, negative fetal heart tones, bleeding EXAM: OBSTETRIC <14 WK ULTRASOUND TECHNIQUE: Transabdominal ultrasound was performed for evaluation of the gestation as well as the maternal uterus and adnexal regions. COMPARISON:  None. FINDINGS: Intrauterine gestational sac: Single Yolk sac:  Not Visualized. Embryo:  Visualized. Cardiac Activity: Not Visualized. CRL:   26.7  mm   9 w 3 d Subchorionic hemorrhage:  None visualized. Maternal uterus/adnexae: Bilateral ovaries are unremarkable. No free fluid. IMPRESSION: Single intrauterine gestation without cardiac activity, as above. Findings meet definitive criteria for failed pregnancy. This follows SRU consensus guidelines: Diagnostic Criteria for Nonviable Pregnancy Early in the First Trimester. Macy Mis Engl J Med [phone]. Electronically Signed   By: Charline BillsSriyesh  Krishnan M.D.   On: 03/13/2016 18:56    MAU Course  Procedures  MDM -MFM  ultrasound shows no cardiac activity and a fetus at 9 weeks and 3 days. Spoke with Dr. Despina HiddenEure at 2000 who recommended cytotec protocol.        Early Intrauterine Pregnancy Failure  x  Documented intrauterine pregnancy failure less than or equal to [redacted] weeks gestation  x No serious current illness  x  Baseline Hgb greater than or equal to 10g/dl  x  Patient has easily accessible transportation to the hospital  x  Clear preference  x  Practitioner/physician deems patient reliable  x  Counseling by practitioner or physician  x  Patient education by RN  ___  Consent form signed  NA  Rho-Gam given by RN if indicated  ___ Medication dispensed   x   Cytotec 800 mcg  x  Intravaginally by patient at home         __   Intravaginally by RN in MAU        __   Rectally by patient at home        __   Rectally by RN in MAU  x Ibuprofen 800 mg 1 tablet by mouth every 8 hours as needed #30  x  Hydrocodone/acetaminophen 5/325 mg by mouth every 4 to 6 hours as needed  x  Phenergan 12.5 mg by mouth every 4 hours as needed for nausea    Assessment and Plan   1. Missed abortion   2. Vaginal bleeding in pregnancy, second trimester   3. Miscarriage   4. [redacted] weeks gestation of pregnancy    DC home Comfort measures reviewed  Bleeding precautions RX: cytotec, phenergan, ibuprofen 800mg , vicodin Return to MAU as needed   Follow-up Information    [facility] .   Specialty:  Obstetrics and Gynecology Why:  They will call you with an appointment  Contact information: [address] [place] [address] [phone]          Charlesetta Garibaldi Kooistra CNM 03/13/2016, 6:21 PM

## 2016-03-13 NOTE — MAU Note (Signed)
Clayton LefortL. Kirby, CNM in to do bedside U/S for FHT's. No FHT's noted. Order placed for U/S dept to come to bedside. Patient very upset. Family at bedside.

## 2016-03-15 ENCOUNTER — Ambulatory Visit: Payer: Self-pay | Admitting: Obstetrics and Gynecology

## 2016-03-28 ENCOUNTER — Encounter: Payer: Self-pay | Admitting: Obstetrics & Gynecology

## 2016-03-28 ENCOUNTER — Telehealth: Payer: Self-pay | Admitting: Obstetrics & Gynecology

## 2016-03-28 NOTE — Telephone Encounter (Signed)
Called patient to get her scheduled, but her voicemail was full. Sending her a certified letter to make a follow-up appointment.

## 2016-04-16 ENCOUNTER — Encounter: Payer: Self-pay | Admitting: Student

## 2016-04-22 ENCOUNTER — Encounter: Payer: Self-pay | Admitting: Family Medicine

## 2016-04-22 ENCOUNTER — Ambulatory Visit (INDEPENDENT_AMBULATORY_CARE_PROVIDER_SITE_OTHER): Payer: Medicaid Other | Admitting: Family Medicine

## 2016-04-22 ENCOUNTER — Other Ambulatory Visit (HOSPITAL_COMMUNITY)
Admission: RE | Admit: 2016-04-22 | Discharge: 2016-04-22 | Disposition: A | Discharge status: Home / Self Care | Payer: Medicaid Other | Source: Ambulatory Visit | Attending: Family Medicine | Admitting: Family Medicine

## 2016-04-22 VITALS — BP 112/53 | HR 85 | Temp 98.0°F | Ht 66.0 in | Wt 136.0 lb

## 2016-04-22 DIAGNOSIS — Z124 Encounter for screening for malignant neoplasm of cervix: Secondary | ICD-10-CM | POA: Diagnosis not present

## 2016-04-22 DIAGNOSIS — N898 Other specified noninflammatory disorders of vagina: Secondary | ICD-10-CM | POA: Diagnosis not present

## 2016-04-22 DIAGNOSIS — Z1151 Encounter for screening for human papillomavirus (HPV): Secondary | ICD-10-CM | POA: Insufficient documentation

## 2016-04-22 DIAGNOSIS — N760 Acute vaginitis: Secondary | ICD-10-CM

## 2016-04-22 DIAGNOSIS — B9689 Other specified bacterial agents as the cause of diseases classified elsewhere: Secondary | ICD-10-CM

## 2016-04-22 DIAGNOSIS — Z01419 Encounter for gynecological examination (general) (routine) without abnormal findings: Secondary | ICD-10-CM | POA: Diagnosis present

## 2016-04-22 DIAGNOSIS — Z113 Encounter for screening for infections with a predominantly sexual mode of transmission: Secondary | ICD-10-CM | POA: Insufficient documentation

## 2016-04-22 LAB — POCT WET PREP (WET MOUNT)
CLUE CELLS WET PREP WHIFF POC: POSITIVE
TRICHOMONAS WET PREP HPF POC: ABSENT

## 2016-04-22 MED ORDER — METRONIDAZOLE 500 MG PO TABS: 500.0000 mg | ORAL_TABLET | Freq: Two times a day (BID) | ORAL | 0 refills | Status: AC

## 2016-04-22 NOTE — Progress Notes (Signed)
    Subjective:  Erika Porter is a 30 y.o. female G5 41P4014 who [facility] follow up.  HPI: Seen in MAU for missed abortion on 03/15/16 at [redacted] weeks gestation of pregnancy, given cytotec protocol at that time. Menstrual cycles have resumed, just finished period which was heavier flow than normal. Denies fever, uterine cramping, foul odor. Is very much not interested in contraception at this time and does not want to discuss options. Has not been sexually active for the last 2-3 months and has no plans to be sexually active in the future but does state that she will be back " for a depo injection or something fast" if that occurs. Endorses some heavy physiologic discharge that is whitish without odor, no itching or blood.  ROS: Per HPI  Objective:  Physical Exam: LMP 11/22/2015   Gen: NAD, resting comfortably Pulm: NWOB GU: normal female. No rashes. No foul odor. Whitish yellow physiologic discharge visible without bleeding. Cervix normal without visible lesions. Skin: warm, dry Neuro: grossly normal, moves all extremities Psych: Normal affect and thought content  Results for orders placed or performed in visit on 04/22/16 (from the past 72 hour(s))  POCT Wet Prep Mellody Drown(Wet [place])     Status: Abnormal   Collection Time: 04/22/16 11:40 AM  Result Value Ref Range   Source Wet Prep POC Vagina    WBC, Wet Prep HPF POC 1-5    Bacteria Wet Prep HPF POC Few Few   Clue Cells Wet Prep HPF POC Few (A) None   Clue Cells Wet Prep Whiff POC Positive Whiff    Yeast Wet Prep HPF POC None    Trichomonas Wet Prep HPF POC Absent Absent     Assessment/Plan:  BV (bacterial vaginosis) Patient opted to leave before wet mount results. Called patient to inform her and to let her know about sending in prescription for flagyl. Gave patient return precautions, she voiced good understanding. GC/chlamydia and pap sent today, results pending.   Leland HerElsia J Doyel Mulkern, DO PGY-1, [facility] 04/22/2016 10:37 AM

## 2016-04-22 NOTE — Patient Instructions (Signed)
Bacterial Vaginosis Bacterial vaginosis is a vaginal infection that occurs when the normal balance of bacteria in the vagina is disrupted. It results from an overgrowth of certain bacteria. This is the most common vaginal infection among women ages 15-44. Because bacterial vaginosis increases your risk for STIs (sexually transmitted infections), getting treated can help reduce your risk for chlamydia, gonorrhea, herpes, and HIV (human immunodeficiency virus). Treatment is also important for preventing complications in pregnant women, because this condition can cause an early (premature) delivery. What are the causes? This condition is caused by an increase in harmful bacteria that are normally present in small amounts in the vagina. However, the reason that the condition develops is not fully understood. What increases the risk? The following factors may make you more likely to develop this condition:  Having a new sexual partner or multiple sexual partners.  Having unprotected sex.  Douching.  Having an intrauterine device (IUD).  Smoking.  Drug and alcohol abuse.  Taking certain antibiotic medicines.  Being pregnant.  You cannot get bacterial vaginosis from toilet seats, bedding, swimming pools, or contact with objects around you. What are the signs or symptoms? Symptoms of this condition include:  Grey or white vaginal discharge. The discharge can also be watery or foamy.  A fish-like odor with discharge, especially after sexual intercourse or during menstruation.  Itching in and around the vagina.  Burning or pain with urination.  Some women with bacterial vaginosis have no signs or symptoms. How is this diagnosed? This condition is diagnosed based on:  Your medical history.  A physical exam of the vagina.  Testing a sample of vaginal fluid under a microscope to look for a large amount of bad bacteria or abnormal cells. Your health care provider may use a cotton swab  or a small wooden spatula to collect the sample.  How is this treated? This condition is treated with antibiotics. These may be given as a pill, a vaginal cream, or a medicine that is put into the vagina (suppository). If the condition comes back after treatment, a second round of antibiotics may be needed. Follow these instructions at home: Medicines  Take over-the-counter and prescription medicines only as told by your health care provider.  Take or use your antibiotic as told by your health care provider. Do not stop taking or using the antibiotic even if you start to feel better. General instructions  If you have a female sexual partner, tell her that you have a vaginal infection. She should see her health care provider and be treated if she has symptoms. If you have a female sexual partner, he does not need treatment.  During treatment: ? Avoid sexual activity until you finish treatment. ? Do not douche. ? Avoid alcohol as directed by your health care provider. ? Avoid breastfeeding as directed by your health care provider.  Drink enough water and fluids to keep your urine clear or pale yellow.  Keep the area around your vagina and rectum clean. ? Wash the area daily with warm water. ? Wipe yourself from front to back after using the toilet.  Keep all follow-up visits as told by your health care provider. This is important. How is this prevented?  Do not douche.  Wash the outside of your vagina with warm water only.  Use protection when having sex. This includes latex condoms and dental dams.  Limit how many sexual partners you have. To help prevent bacterial vaginosis, it is best to have sex with just   one partner (monogamous).  Make sure you and your sexual partner are tested for STIs.  Wear cotton or cotton-lined underwear.  Avoid wearing tight pants and pantyhose, especially during summer.  Limit the amount of alcohol that you drink.  Do not use any products that  contain nicotine or tobacco, such as cigarettes and e-cigarettes. If you need help quitting, ask your health care provider.  Do not use illegal drugs. Where to find more information:  Centers for Disease Control and Prevention: www.cdc.gov/std  American Sexual Health Association (ASHA): www.ashastd.org  U.S. Department of Health and Human Services, Office on Women's Health: www.womenshealth.gov/ or https://www.womenshealth.gov/a-z-topics/bacterial-vaginosis Contact a health care provider if:  Your symptoms do not improve, even after treatment.  You have more discharge or pain when urinating.  You have a fever.  You have pain in your abdomen.  You have pain during sex.  You have vaginal bleeding between periods. Summary  Bacterial vaginosis is a vaginal infection that occurs when the normal balance of bacteria in the vagina is disrupted.  Because bacterial vaginosis increases your risk for STIs (sexually transmitted infections), getting treated can help reduce your risk for chlamydia, gonorrhea, herpes, and HIV (human immunodeficiency virus). Treatment is also important for preventing complications in pregnant women, because the condition can cause an early (premature) delivery.  This condition is treated with antibiotic medicines. These may be given as a pill, a vaginal cream, or a medicine that is put into the vagina (suppository). This information is not intended to replace advice given to you by your health care provider. Make sure you discuss any questions you have with your health care provider. Document Released: 05/06/2005 Document Revised: 01/20/2016 Document Reviewed: 01/20/2016 Elsevier Interactive Patient Education  2017 Elsevier Inc.  

## 2016-04-22 NOTE — Assessment & Plan Note (Signed)
Patient opted to leave before wet mount results. Called patient to inform her and to let her know about sending in prescription for flagyl. Gave patient return precautions, she voiced good understanding. GC/chlamydia and pap sent today, results pending.

## 2016-04-23 LAB — CERVICOVAGINAL ANCILLARY ONLY
CHLAMYDIA, DNA PROBE: NEGATIVE
NEISSERIA GONORRHEA: NEGATIVE

## 2016-04-23 LAB — CYTOLOGY - PAP
Diagnosis: NEGATIVE
HPV: DETECTED — AB

## 2016-04-24 ENCOUNTER — Telehealth: Payer: Self-pay | Admitting: Family Medicine

## 2016-04-24 NOTE — Telephone Encounter (Signed)
Spoke with patient about positive HPV result and advised coloposcopy. Patient stated she would call front desk and make colposcopy appt.

## 2016-05-02 ENCOUNTER — Encounter: Payer: Self-pay | Admitting: Family Medicine

## 2016-05-02 ENCOUNTER — Ambulatory Visit (INDEPENDENT_AMBULATORY_CARE_PROVIDER_SITE_OTHER): Payer: Medicaid Other | Admitting: Family Medicine

## 2016-05-02 VITALS — BP 130/82 | HR 101 | Temp 99.0°F | Ht 66.0 in | Wt 135.4 lb

## 2016-05-02 DIAGNOSIS — R87810 Cervical high risk human papillomavirus (HPV) DNA test positive: Secondary | ICD-10-CM

## 2016-05-02 DIAGNOSIS — R87619 Unspecified abnormal cytological findings in specimens from cervix uteri: Secondary | ICD-10-CM

## 2016-05-02 LAB — POCT URINE PREGNANCY: PREG TEST UR: NEGATIVE

## 2016-05-02 NOTE — Patient Instructions (Signed)
It was nice seeing you today. Your colposcopy procedure went well. We didn't need to get any biopsy. Please repeat  PAP with HPV testing in 1 yr. Follow up with your PCP for other health concern. Colposcopy, Care After This sheet gives you information about how to care for yourself after your procedure. Your doctor may also give you more specific instructions. If you have problems or questions, contact your doctor. What can I expect after the procedure? If you did not have a tissue sample removed (did not have a biopsy), you may only have some spotting for a few days. You can go back to your normal activities. If you had a tissue sample removed, it is common to have:  Soreness and pain. This may last for a few days.  Light-headedness.  Mild bleeding from your vagina or dark-colored, grainy discharge from your vagina. This may last for a few days. You may need to wear a sanitary pad.  Spotting for at least 48 hours after the procedure. Follow these instructions at home:  Take over-the-counter and prescription medicines only as told by your doctor. Ask your doctor what medicines you can start taking again. This is very important if you take blood-thinning medicine.  Do not drive or use heavy machinery while taking prescription pain medicine.  For 3 days, or as long as your doctor tells you, avoid:  Douching.  Using tampons.  Having sex.  If you use birth control (contraception), keep using it.  Limit activity for the first day after the procedure. Ask your doctor what activities are safe for you.  It is up to you to get the results of your procedure. Ask your doctor when your results will be ready.  Keep all follow-up visits as told by your doctor. This is important. Contact a doctor if:  You get a skin rash. Get help right away if:  You are bleeding a lot from your vagina. It is a lot of bleeding if you are using more than one pad an hour for 2 hours in a row.  You have  clumps of blood (blood clots) coming from your vagina.  You have a fever.  You have chills  You have pain in your lower belly (pelvic area).  You have signs of infection, such as vaginal discharge that is:  Different than usual.  Yellow.  Bad-smelling.  You have very pain or cramps in your lower belly that do not get better with medicine.  You feel light-headed.  You feel dizzy.  You pass out (faint). Summary  If you did not have a tissue sample removed (did not have a biopsy), you may only have some spotting for a few days. You can go back to your normal activities.  If you had a tissue sample removed, it is common to have mild pain and spotting for 48 hours.  For 3 days, or as long as your doctor tells you, avoid douching, using tampons and having sex.  Get help right away if you have bleeding, very bad pain, or signs of infection. This information is not intended to replace advice given to you by your health care provider. Make sure you discuss any questions you have with your health care provider. Document Released: 10/23/2007 Document Revised: 01/24/2016 Document Reviewed: 01/24/2016 Elsevier Interactive Patient Education  2017 [employer]

## 2016-05-02 NOTE — Progress Notes (Signed)
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 The prior pap showed no abnormalities.  Prior cervical/vaginal disease: normal exam without visible pathology. Prior cervical treatment: no treatment.  Procedure Details  The risks and benefits of the procedure and Written informed consent obtained.  Speculum placed in vagina and excellent visualization of cervix achieved, cervix swabbed x 3 with acetic acid solution.  Findings: Cervix: no visible lesions, no mosaicism, no punctation and no abnormal vasculature; cervix swabbed with Lugol's solution, endocervical speculum placed, SCJ visualized 360 degrees without lesions and no biopsies taken. Vaginal inspection: normal without visible lesions. Vulvar colposcopy: vulvar colposcopy not performed.  Specimens: None  Complications: none.  Plan: Return in a year for repeat pap smear Patient was given post procedure instructions.   Erika AdaJazma Phelps, DO 05/02/2016, 10:28 AM PGY-3, [facility]

## 2016-05-20 NOTE — L&D Delivery Note (Signed)
Patient is a 31 y.o. now [id number] s/p NSVD at 5927w6d, who was admitted for SOL. S/p AROM at 22:15. GBS negative.  Delivery Note At 10:50 PM a viable female was delivered via Vaginal, Spontaneous (Presentation: vertex; ROA ).  APGAR: pending; weight  pending Placenta status: intact, sent to L&D.  Cord:  3-vessel  Anesthesia:  Epidural Episiotomy: None Lacerations: Periurethral, superficial Suture Repair: none Est. Blood Loss (mL):  150   Head delivered ROA. No nuchal cord present. Shoulder and body delivered in usual fashion. Infant with spontaneous cry, placed on mother's abdomen, dried and bulb suctioned. Cord clamped x 2 after 1-minute delay, and cut by family member. Cord blood drawn. Placenta delivered spontaneously with gentle cord traction. Fundus firm with massage and Pitocin. Perineum inspected and found to have superficial periurethral laceration, which was hemostatic.  Mom to postpartum.  Baby to Couplet care / Skin to Skin.   Raynelle FanningJulie P. Degele, MD OB Fellow 05/16/17, 11:09 PM

## 2016-07-11 ENCOUNTER — Ambulatory Visit: Payer: Medicaid Other | Admitting: Obstetrics and Gynecology

## 2016-07-15 ENCOUNTER — Emergency Department (HOSPITAL_COMMUNITY)
Admission: EM | Admit: 2016-07-15 | Discharge: 2016-07-16 | Disposition: A | Discharge status: Home / Self Care | Payer: Medicaid Other | Attending: Emergency Medicine | Admitting: Emergency Medicine

## 2016-07-15 ENCOUNTER — Encounter (HOSPITAL_COMMUNITY): Payer: Self-pay | Admitting: *Deleted

## 2016-07-15 DIAGNOSIS — F17210 Nicotine dependence, cigarettes, uncomplicated: Secondary | ICD-10-CM | POA: Insufficient documentation

## 2016-07-15 DIAGNOSIS — J029 Acute pharyngitis, unspecified: Secondary | ICD-10-CM | POA: Insufficient documentation

## 2016-07-15 NOTE — ED Triage Notes (Signed)
Pt c/o sore throat x 1 week with increased pain when swallowing

## 2016-07-16 LAB — RAPID STREP SCREEN (MED CTR MEBANE ONLY): Streptococcus, Group A Screen (Direct): NEGATIVE

## 2016-07-16 MED ORDER — HYDROCODONE-ACETAMINOPHEN 7.5-325 MG/15ML PO SOLN: 10.0000 mL | Freq: Four times a day (QID) | ORAL | 0 refills | Status: DC | PRN

## 2016-07-16 NOTE — ED Provider Notes (Signed)
[facility]-EMERGENCY DEPT Provider Note   CSN: [id number] Arrival date & time: 07/15/16  2316     History   Chief Complaint Chief Complaint  Patient presents with  . Sore Throat    HPI Erika Porter is a 31 y.o. female.  Patient complains of 6 days of sore throat with intermittent nausea without vomiting. No fever. She denies significant congestion, has minimal dry cough. She reports her daughter complains of sore throat as well. No rash or headache.   The history is provided by the patient. No language interpreter was used.  Sore Throat  Pertinent negatives include no chest pain, no abdominal pain, no headaches and no shortness of breath.    Past Medical History:  Diagnosis Date  . Anemia   . BV (bacterial vaginosis)   . Chlamydia   . Headache(784.0)   . Infection    uti  . Trichomonas     Patient Active Problem List   Diagnosis Date Noted  . Plantar warts 11/22/2015  . Hidradenitis suppurativa 10/01/2012  . History of hyperthyroidism 04/30/2012  . BV (bacterial vaginosis) 11/27/2011  . Allergic rhinitis 11/18/2011  . Contraception management 03/15/2011  . TOBACCO USER 09/12/2008  . SUBSTANCE ABUSE 09/12/2008    Past Surgical History:  Procedure Laterality Date  . NO PAST SURGERIES      OB History    Gravida Para Term Preterm AB Living   5 4 4  0 0 4   SAB TAB Ectopic Multiple Live Births   0 0 0 0 4       Home Medications    Prior to Admission medications   Medication Sig Start Date End Date Taking? Authorizing Provider  acetaminophen (TYLENOL) 500 MG tablet Take 1,500 mg by mouth every 6 (six) hours as needed for headache.     Historical Provider, MD  HYDROcodone-acetaminophen (NORCO/VICODIN) 5-325 MG tablet Take 1-2 tablets by mouth every 4 (four) hours as needed. 03/13/16   Armando ReichertHeather D Hogan, CNM  ibuprofen (ADVIL,MOTRIN) 800 MG tablet Take 1 tablet (800 mg total) by mouth every 8 (eight) hours as needed. 03/13/16   Armando ReichertHeather D Hogan, CNM  misoprostol  (CYTOTEC) 200 MCG tablet Place 4 tablets (800 mcg total) vaginally once. Take 4 tablets vaginally at bedtime. If no bleeding within 24 hours may take another 4 tablets vaginally. 03/13/16 03/13/16  Armando ReichertHeather D Hogan, CNM  Prenatal Vit-Fe Fumarate-FA (PRENATAL VITAMIN) 27-0.8 MG TABS Take 1 tablet by mouth daily. 11/20/15   Pincus LargeJazma Y Phelps, DO  promethazine (PHENERGAN) 25 MG tablet Take 0.5-1 tablets (12.5-25 mg total) by mouth every 6 (six) hours as needed. 03/13/16   Armando ReichertHeather D Hogan, CNM    Family History Family History  Problem Relation Age of Onset  . Hypertension Mother   . Anxiety disorder Mother   . Asthma Sister   . Asthma Daughter   . Asthma Son   . Cancer Maternal Aunt   . Heart disease Maternal Grandfather     Social History Social History  Substance Use Topics  . Smoking status: Current Some Day Smoker    Packs/day: 0.10    Years: 4.00    Types: Cigarettes  . Smokeless tobacco: Never Used     Comment: quit with preg  . Alcohol use 0.0 oz/week     Allergies   Patient has no known allergies.   Review of Systems Review of Systems  Constitutional: Negative for appetite change, chills and fever.  HENT: Positive for sore throat. Negative for congestion  and trouble swallowing.   Respiratory: Negative.  Negative for shortness of breath.   Cardiovascular: Negative.  Negative for chest pain.  Gastrointestinal: Positive for nausea. Negative for abdominal pain and vomiting.  Musculoskeletal: Negative.  Negative for neck stiffness.  Skin: Negative.  Negative for rash.  Neurological: Negative.  Negative for headaches.     Physical Exam Updated Vital Signs BP 109/75 (BP Location: Left Arm)   Pulse 76   Temp 97.8 F (36.6 C) (Oral)   Resp 18   LMP 07/08/2016   SpO2 98%   Physical Exam  Constitutional: She appears well-developed and well-nourished.  HENT:  Head: Normocephalic.  No oropharyngeal redness or swelling. Uvula midline  Neck: Normal range of motion. Neck  supple.  Cardiovascular: Normal rate and regular rhythm.   No murmur heard. Pulmonary/Chest: Effort normal and breath sounds normal. She has no wheezes. She has no rales.  Abdominal: Soft. Bowel sounds are normal. There is no tenderness. There is no rebound and no guarding.  Musculoskeletal: Normal range of motion.  Lymphadenopathy:    She has no cervical adenopathy.  Neurological: She is alert. No cranial nerve deficit.  Skin: Skin is warm and dry. No rash noted.  Psychiatric: She has a normal mood and affect.     ED Treatments / Results  Labs (all labs ordered are listed, but only abnormal results are displayed) Labs Reviewed  RAPID STREP SCREEN (NOT AT [facility])    EKG  EKG Interpretation None       Radiology No results found.  Procedures Procedures (including critical care time)  Medications Ordered in ED Medications - No data to display   Initial Impression / Assessment and Plan / ED Course  I have reviewed the triage vital signs and the nursing notes.  Pertinent labs & imaging results that were available during my care of the patient were reviewed by me and considered in my medical decision making (see chart for details).     Patient c/o sore throat. Has a negative strep here. Normal vitals. No history of fever. Able to swallow without difficulty.   She can be discharged home with supportive care. Encouraged to stop smoking.  Final Clinical Impressions(s) / ED Diagnoses   Final diagnoses:  None   1. Pharyngitis  New Prescriptions New Prescriptions   No medications on file     Danne Harbor 07/16/16 1610    Glynn Octave, MD 07/16/16 [phone]

## 2016-07-18 LAB — CULTURE, GROUP A STREP (THRC)

## 2016-09-17 ENCOUNTER — Ambulatory Visit: Payer: Medicaid Other | Admitting: Obstetrics and Gynecology

## 2016-09-18 ENCOUNTER — Ambulatory Visit (INDEPENDENT_AMBULATORY_CARE_PROVIDER_SITE_OTHER): Payer: Medicaid Other | Admitting: Family Medicine

## 2016-09-18 ENCOUNTER — Telehealth: Payer: Self-pay | Admitting: Family Medicine

## 2016-09-18 ENCOUNTER — Other Ambulatory Visit (HOSPITAL_COMMUNITY)
Admission: RE | Admit: 2016-09-18 | Discharge: 2016-09-18 | Disposition: A | Discharge status: Home / Self Care | Payer: Medicaid Other | Source: Ambulatory Visit | Attending: Family Medicine | Admitting: Family Medicine

## 2016-09-18 VITALS — BP 114/68 | HR 88 | Temp 98.0°F | Resp 18 | Ht 67.0 in | Wt 143.8 lb

## 2016-09-18 DIAGNOSIS — N898 Other specified noninflammatory disorders of vagina: Secondary | ICD-10-CM | POA: Insufficient documentation

## 2016-09-18 DIAGNOSIS — Z3200 Encounter for pregnancy test, result unknown: Secondary | ICD-10-CM | POA: Diagnosis not present

## 2016-09-18 DIAGNOSIS — Z113 Encounter for screening for infections with a predominantly sexual mode of transmission: Secondary | ICD-10-CM

## 2016-09-18 HISTORY — DX: Encounter for screening for infections with a predominantly sexual mode of transmission: Z11.3

## 2016-09-18 LAB — POCT WET PREP (WET MOUNT)
CLUE CELLS WET PREP WHIFF POC: POSITIVE
Trichomonas Wet Prep HPF POC: ABSENT

## 2016-09-18 LAB — POCT URINE PREGNANCY: Preg Test, Ur: NEGATIVE

## 2016-09-18 MED ORDER — METRONIDAZOLE 500 MG PO TABS: 500.0000 mg | ORAL_TABLET | Freq: Two times a day (BID) | ORAL | 0 refills | Status: DC

## 2016-09-18 MED ORDER — PERMETHRIN 5 % EX CREA: 1.0000 "application " | TOPICAL_CREAM | Freq: Once | CUTANEOUS | 1 refills | Status: AC

## 2016-09-18 MED ORDER — FLUCONAZOLE 150 MG PO TABS: 150.0000 mg | ORAL_TABLET | Freq: Once | ORAL | 0 refills | Status: AC

## 2016-09-18 NOTE — Telephone Encounter (Signed)
Attempted to call pt concerning results. No answer, left voicemail. If she returns call please relay the following"  Pregnancy test negative  Additional tests reveal both yeast infection and bacterial vaginosis. I have prescribed Flagyl twice daily for 7 days and diflucan one tablet once.  She should not drink alcohol with the Flagyl as it will cause flushing, abdominal pain, nausea, and vomiting.  Gonorrhea and chlamydia tests are pending, we will call with these results. Let me know if she has any questions.  Thanks, Joanna Puff, MD [facility] Resident  09/18/2016, 10:59 AM

## 2016-09-18 NOTE — Patient Instructions (Signed)
I will call you with the results of the tests.  In the future, try to use condoms consistently.

## 2016-09-18 NOTE — Progress Notes (Signed)
    Subjective: CC: STD testing and pregnancy testing HPI: Patient is a 31 y.o. female with presenting to clinic today for a SDA for STD testing.  Was last sexually active on 08/19/16, did not use protection and is concerned about STDs.  She has intermittent abdominal cramping x 1 week. Clear/white vaginal discharge, no smell.  Notes vulvar pruritus x 1 week with some irritation when urine hits the area. No lesions noted.   LMP: 09/03/16 but still worried she could be pregnant.  No n/v, fevers, chills, change in BMs.   Social History: nonsmoker  ROS: All other systems reviewed and are negative.  Past Medical History Patient Active Problem List   Diagnosis Date Noted  . Routine screening for STI (sexually transmitted infection) 09/18/2016  . Plantar warts 11/22/2015  . Hidradenitis suppurativa 10/01/2012  . History of hyperthyroidism 04/30/2012  . BV (bacterial vaginosis) 11/27/2011  . Allergic rhinitis 11/18/2011  . Contraception management 03/15/2011  . TOBACCO USER 09/12/2008  . SUBSTANCE ABUSE 09/12/2008    Medications- reviewed and updated  Objective: Office vital signs reviewed. BP 114/68 (BP Location: Right Arm, Patient Position: Sitting, Cuff Size: Large)   Pulse 88   Temp 98 F (36.7 C) (Oral)   Resp 18   Ht  (1.702 m)   Wt 143 lb 12.8 oz (65.2 kg)   LMP 09/04/2015 (Approximate)   SpO2 95%   Breastfeeding? No   BMI 22.52 kg/m    Physical Examination:  General: Awake, alert, well- nourished, NAD GI: +BS, soft, non-distended, non-tender GYN:  External genitalia within normal limits.  Vaginal mucosa pink, moist, normal rugae.  Nonfriable cervix without lesions, no discharge or bleeding noted on speculum exam.  Bimanual exam revealed normal, nongravid uterus.  No cervical motion tenderness. No adnexal masses bilaterally.    Results for orders placed or performed in visit on 09/18/16  POCT urine pregnancy  Result Value Ref Range   Preg Test, Ur  Negative Negative  POCT Wet Prep [facility])  Result Value Ref Range   Source Wet Prep POC vaginal    WBC, Wet Prep HPF POC 5-10    Bacteria Wet Prep HPF POC Moderate (A) Few   Clue Cells Wet Prep HPF POC Moderate (A) None   Clue Cells Wet Prep Whiff POC Positive Whiff    Yeast Wet Prep HPF POC Few    Trichomonas Wet Prep HPF POC Absent Absent      Assessment/Plan: Routine screening for STI (sexually transmitted infection) Patient is presenting for STI testing after unprotected intercourse with a partner whom she's worried may have STDs. She declines HIV and syphilis testing today. Wet prep consistent with BV and yeast infection. Patient's symptoms most consistent with yeast infection, however will treat for both. Attempted to call pt with results (no answer, left telephone note encounter).  - Diflucan  once  - Flagyl  BID x 7 days. Discussed disulfiram reaction.  - discussed regular condom use - will call pt regarding GC/chlamydia testing.   Orders Placed This Encounter  Procedures  . POCT urine pregnancy  . POCT Wet Prep [facility])    Meds ordered this encounter  Medications  . permethrin (ELIMITE) 5 % cream    Sig: Apply 1 application topically once.    Dispense:  60 g    Refill:  1    Joanna Puff PGY-3, [facility]

## 2016-09-18 NOTE — Assessment & Plan Note (Addendum)
Patient is presenting for STI testing after unprotected intercourse with a partner whom she's worried may have STDs. She declines HIV and syphilis testing today. Wet prep consistent with BV and yeast infection. Patient's symptoms most consistent with yeast infection, however will treat for both. Attempted to call pt with results (no answer, left telephone note encounter).  - Diflucan  once  - Flagyl  BID x 7 days. Discussed disulfiram reaction.  - discussed regular condom use - will call pt regarding GC/chlamydia testing.

## 2016-09-20 ENCOUNTER — Telehealth: Payer: Self-pay | Admitting: Family Medicine

## 2016-09-20 LAB — GC/CHLAMYDIA PROBE AMP (~~LOC~~) NOT AT ARMC
Chlamydia: NEGATIVE
NEISSERIA GONORRHEA: NEGATIVE

## 2016-09-20 NOTE — Telephone Encounter (Signed)
Pt was advised. ep °

## 2016-09-20 NOTE — Telephone Encounter (Signed)
Please let the pt know that both gonorrhea and chlamydia are negative.  Thanks, Joanna Puffrystal S. Blimi Godby, MD [facility] Resident  09/20/2016, 1:41 PM

## 2016-09-20 NOTE — Telephone Encounter (Signed)
Pt informed. Deseree Blount, CMA  

## 2016-10-10 NOTE — Progress Notes (Signed)
   Subjective:   Erika Porter is a 31 y.o. female with a history of hidradenitis, tobacco use, substance abuse here for No chief complaint on file.    LMP 08/24/16 - she is certain about the state and has not taken contraception in many years She is taken 3 positive pregnancy tests at home She started taking her sister's prenatal vitamins within the last week.  She is previously a G6 P4 024 She quit smoking cigarettes 4-5 days ago she was pregnant She last drank alcohol on Mother's Day and does not plan to drink anymore alcohol She does continue to use marijuana 4 times per day She denies any complications with her previous pregnancies She denies any pelvic pain or vaginal bleeding  Review of Systems:  Per HPI.   Social History: see smoking status above  Objective:  LMP 11/22/2015   Gen:  31 y.o. female in NAD  HEENT: NCAT, MMM, anicteric sclerae CV: RRR, no MRG Resp: Non-labored, CTAB, no wheezes noted Abd: Soft, NTND, BS present, no guarding or organomegaly Ext: WWP, no edema MSK: No obvious deformities, gait intact Neuro: Alert and oriented, speech normal     Assessment & Plan:     Erika SmilingJessica M Donn is a 31 y.o. female here for   1. Amenorrhea - POCT urine pregnancy - positive - discussed with patient - avoid tobacco, alcohol, attempt to quit using THC - Start PNV - [facility] - Return precautions discussed   Raseel Jans, Marzella SchleinAngela M, MD MPH PGY-3,  [facility] 10/10/2016  12:00 PM

## 2016-10-11 ENCOUNTER — Ambulatory Visit (INDEPENDENT_AMBULATORY_CARE_PROVIDER_SITE_OTHER): Payer: Medicaid Other | Admitting: Family Medicine

## 2016-10-11 ENCOUNTER — Encounter: Payer: Self-pay | Admitting: Family Medicine

## 2016-10-11 VITALS — BP 110/80 | HR 91 | Temp 98.7°F | Wt 145.0 lb

## 2016-10-11 DIAGNOSIS — N912 Amenorrhea, unspecified: Secondary | ICD-10-CM | POA: Diagnosis not present

## 2016-10-11 LAB — POCT URINE PREGNANCY: PREG TEST UR: POSITIVE — AB

## 2016-10-11 MED ORDER — PRENATAL ADULT GUMMY/DHA/FA 0.4-25 MG PO CHEW: 1.0000 | CHEWABLE_TABLET | Freq: Every day | ORAL | 9 refills | Status: DC

## 2016-10-11 NOTE — Patient Instructions (Addendum)
You are pregnant.  Start daily prenatal vitamins.  Someone will call you to set up appointments for prenatal care.  Try to cut back on and quit smoking marijuana.  Take care, Dr. BLeonard Schwartz

## 2016-11-06 ENCOUNTER — Other Ambulatory Visit: Payer: Self-pay | Admitting: Obstetrics and Gynecology

## 2016-11-06 NOTE — Telephone Encounter (Signed)
[facility] [address] ep

## 2016-11-07 NOTE — Telephone Encounter (Signed)
What is her indication for needing medicine? If she is nauseous and vomiting she should be evaluated. If it I sdue to pregnancy there is another medication I would recommend for morning sickness. Also if she is pregnant she still has not come in for prenatal care and this needs to be set up. Thanks!

## 2016-11-07 NOTE — Telephone Encounter (Signed)
Patient stated it is due to pregnancy and she is having prenatal care at a different provider's office.  Clovis PuMartin, Shermar Friedland L, RN

## 2016-11-07 NOTE — Telephone Encounter (Signed)
Then she should get her nausea medicine from them since they are managing her pregnancy. This so that she not have multiple providers treating it. Thanks!

## 2016-11-07 NOTE — Telephone Encounter (Signed)
Patient advised to call her prenatal doctor.  Clovis PuMartin, Tamika L, RN

## 2016-11-13 ENCOUNTER — Ambulatory Visit: Payer: Medicaid Other | Admitting: Obstetrics and Gynecology

## 2016-11-18 ENCOUNTER — Ambulatory Visit: Payer: Medicaid Other | Admitting: Obstetrics and Gynecology

## 2016-11-21 ENCOUNTER — Encounter (HOSPITAL_COMMUNITY): Payer: Self-pay | Admitting: *Deleted

## 2016-11-21 ENCOUNTER — Inpatient Hospital Stay (HOSPITAL_COMMUNITY)
Admission: AD | Admit: 2016-11-21 | Discharge: 2016-11-22 | Disposition: A | Discharge status: Home / Self Care | Payer: Medicaid Other | Source: Ambulatory Visit | Attending: Obstetrics and Gynecology | Admitting: Obstetrics and Gynecology

## 2016-11-21 DIAGNOSIS — O210 Mild hyperemesis gravidarum: Secondary | ICD-10-CM | POA: Insufficient documentation

## 2016-11-21 DIAGNOSIS — O99331 Smoking (tobacco) complicating pregnancy, first trimester: Secondary | ICD-10-CM | POA: Insufficient documentation

## 2016-11-21 DIAGNOSIS — O26851 Spotting complicating pregnancy, first trimester: Secondary | ICD-10-CM | POA: Diagnosis present

## 2016-11-21 DIAGNOSIS — O219 Vomiting of pregnancy, unspecified: Secondary | ICD-10-CM | POA: Diagnosis not present

## 2016-11-21 DIAGNOSIS — F17210 Nicotine dependence, cigarettes, uncomplicated: Secondary | ICD-10-CM | POA: Insufficient documentation

## 2016-11-21 DIAGNOSIS — Z3A12 12 weeks gestation of pregnancy: Secondary | ICD-10-CM | POA: Insufficient documentation

## 2016-11-21 LAB — URINALYSIS, ROUTINE W REFLEX MICROSCOPIC
BACTERIA UA: NONE SEEN
Bilirubin Urine: NEGATIVE
GLUCOSE, UA: NEGATIVE mg/dL
Hgb urine dipstick: NEGATIVE
Ketones, ur: 5 mg/dL — AB
Leukocytes, UA: NEGATIVE
Nitrite: NEGATIVE
PROTEIN: 100 mg/dL — AB
SPECIFIC GRAVITY, URINE: 1.027 (ref 1.005–1.030)
pH: 5 (ref 5.0–8.0)

## 2016-11-21 NOTE — MAU Note (Signed)
.   DID HPT -    X2  POSITIVE.    WENT TO [facility]  IN MAY   - TO GET CONFIRMATION.    STARTED  SPOTTING  LAST NIGHT   ON PANTYLINER-  3 DOTS.   NO SPOTTING  TODAY. LAST SEX-    June .     NO CRAMPS/ PAIN

## 2016-11-21 NOTE — MAU Note (Signed)
Pt called, not in lobby 

## 2016-11-22 ENCOUNTER — Encounter (HOSPITAL_COMMUNITY): Payer: Self-pay | Admitting: *Deleted

## 2016-11-22 DIAGNOSIS — O219 Vomiting of pregnancy, unspecified: Secondary | ICD-10-CM

## 2016-11-22 MED ORDER — PROMETHAZINE HCL 12.5 MG PO TABS: 12.5000 mg | ORAL_TABLET | Freq: Four times a day (QID) | ORAL | 0 refills | Status: DC | PRN

## 2016-11-22 MED ORDER — ACETAMINOPHEN 500 MG PO TABS: 1000.0000 mg | ORAL_TABLET | Freq: Four times a day (QID) | ORAL | 0 refills | Status: DC | PRN

## 2016-11-22 NOTE — MAU Provider Note (Signed)
History     CSN: [id number]  Arrival date and time: 11/21/16 4098   First Provider Initiated Contact with Patient 11/22/16 [phone]      Chief Complaint  Patient presents with  . Vaginal Bleeding   HPI Ms. Erika Porter is a 31 y.o. [id number] at [redacted]w[redacted]d who presents to MAU today with complaint of N/V and spotting. The patient states that she has had N/V throughout the pregnancy. She has not tried any medications. She is not currently nauseous and was able to eat [employer] today without emesis. She states that she has lost ~ 15 lbs since she became pregnant. She denies diarrhea or constipation. She plans to go to [facility] for prenatal care. She also noted scant light pink spotting once last night with wiping. She denies continued bleeding or recent intercourse.    OB History    Gravida Para Term Preterm AB Living   6 4 4  0 0 4   SAB TAB Ectopic Multiple Live Births   0 0 0 0 4      Past Medical History:  Diagnosis Date  . Anemia   . BV (bacterial vaginosis)   . Chlamydia   . Headache(784.0)   . Infection    uti  . Trichomonas     Past Surgical History:  Procedure Laterality Date  . NO PAST SURGERIES    . WISDOM TOOTH EXTRACTION      Family History  Problem Relation Age of Onset  . Hypertension Mother   . Anxiety disorder Mother   . Asthma Sister   . Asthma Daughter   . Asthma Son   . Cancer Maternal Aunt   . Heart disease Maternal Grandfather     Social History  Substance Use Topics  . Smoking status: Current Some Day Smoker    Packs/day: 0.10    Years: 4.00    Types: Cigarettes  . Smokeless tobacco: Never Used     Comment: quit with preg  . Alcohol use 0.0 oz/week     Comment: not while preg    Allergies: No Known Allergies  Prescriptions Prior to Admission  Medication Sig Dispense Refill Last Dose  . acetaminophen (TYLENOL) 500 MG tablet Take 1,500 mg by mouth every 6 (six) hours as needed for headache.    Past Week at Unknown time  . Prenatal MV & Min  w/FA-DHA (PRENATAL ADULT GUMMY/DHA/FA) 0.4-25 MG CHEW Chew 1 tablet by mouth daily. 30 tablet 9 11/21/2016 at Unknown time  . ibuprofen (ADVIL,MOTRIN) 800 MG tablet Take 1 tablet (800 mg total) by mouth every 8 (eight) hours as needed. 30 tablet 0 More than a month at Unknown time  . metroNIDAZOLE (FLAGYL) 500 MG tablet Take 1 tablet (500 mg total) by mouth 2 (two) times daily. 14 tablet 0 More than a month at Unknown time    Review of Systems  Constitutional: Negative for fever.  Gastrointestinal: Positive for nausea and vomiting. Negative for abdominal pain, constipation and diarrhea.  Genitourinary: Positive for vaginal bleeding. Negative for vaginal discharge.   Physical Exam   Blood pressure 125/75, pulse 85, temperature 98.7 F (37.1 C), temperature source Oral, resp. rate 16, height 5\' 7"  (1.702 m), weight 139 lb 4 oz (63.2 kg), last menstrual period 08/24/2016.  Physical Exam  Nursing note and vitals reviewed. Constitutional: She is oriented to person, place, and time. She appears well-developed and well-nourished. No distress.  HENT:  Head: Normocephalic and atraumatic.  Cardiovascular: Normal rate.   Respiratory: Effort normal.  GI: Soft. She exhibits no distension and no mass. There is no tenderness. There is no rebound and no guarding.  Neurological: She is alert and oriented to person, place, and time.  Skin: Skin is warm and dry. No erythema.  Psychiatric: She has a normal mood and affect.  Dilation: Closed Effacement (%): Thick Cervical Position: Posterior Exam by:: Vonzella NippleJulie Filiberto Wamble, PA-C   Results for orders placed or performed during the hospital encounter of 11/21/16 (from the past 24 hour(s))  Urinalysis, Routine w reflex microscopic     Status: Abnormal   Collection Time: 11/21/16  7:23 PM  Result Value Ref Range   Color, Urine YELLOW YELLOW   APPearance CLEAR CLEAR   Specific Gravity, Urine 1.027 1.005 - 1.030   pH 5.0 5.0 - 8.0   Glucose, UA NEGATIVE NEGATIVE  mg/dL   Hgb urine dipstick NEGATIVE NEGATIVE   Bilirubin Urine NEGATIVE NEGATIVE   Ketones, ur 5 (A) NEGATIVE mg/dL   Protein, ur 161100 (A) NEGATIVE mg/dL   Nitrite NEGATIVE NEGATIVE   Leukocytes, UA NEGATIVE NEGATIVE   RBC / HPF 0-5 0 - 5 RBC/hpf   WBC, UA 6-30 0 - 5 WBC/hpf   Bacteria, UA NONE SEEN NONE SEEN   Squamous Epithelial / LPF 0-5 (A) NONE SEEN   Mucous PRESENT     MAU Course  Procedures None  [facility]M +UPT at [facility] in May FHR - 158 bpm with doppler UA today with very mild dehydration Patient is not currently nauseous and tolerated PO this evening. Will trial anti-emetics at home.   Assessment and Plan  A: SIUP at 2422w6d Spotting in pregnancy, first trimester Nausea and vomiting in pregnancy prior to [redacted] weeks gestation   P: Discharge home Rx for Phenergan given  First trimester precautions discussed Patient advised to follow-up with [facility] to start prenatal care Patient may return to MAU as needed or if her condition were to change or worsen   Vonzella NippleJulie Romney Compean, PA-C 11/22/2016, 12:51 AM

## 2016-11-22 NOTE — Discharge Instructions (Signed)
Morning Sickness °Morning sickness is when you feel sick to your stomach (nauseous) during pregnancy. You may feel sick to your stomach and throw up (vomit). You may feel sick in the morning, but you can feel this way any time of day. Some women feel very sick to their stomach and cannot stop throwing up (hyperemesis gravidarum). °Follow these instructions at home: °· Only take medicines as told by your doctor. °· Take multivitamins as told by your doctor. Taking multivitamins before getting pregnant can stop or lessen the harshness of morning sickness. °· Eat dry toast or unsalted crackers before getting out of bed. °· Eat 5 to 6 small meals a day. °· Eat dry and bland foods like rice and baked potatoes. °· Do not drink liquids with meals. Drink between meals. °· Do not eat greasy, fatty, or spicy foods. °· Have someone Yousuf for you if the smell of food causes you to feel sick or throw up. °· If you feel sick to your stomach after taking prenatal vitamins, take them at night or with a snack. °· Eat protein when you need a snack (nuts, yogurt, cheese). °· Eat unsweetened gelatins for dessert. °· Wear a bracelet used for sea sickness (acupressure wristband). °· Go to a doctor that puts thin needles into certain body points (acupuncture) to improve how you feel. °· Do not smoke. °· Use a humidifier to keep the air in your house free of odors. °· Get lots of fresh air. °Contact a doctor if: °· You need medicine to feel better. °· You feel dizzy or lightheaded. °· You are losing weight. °Get help right away if: °· You feel very sick to your stomach and cannot stop throwing up. °· You pass out (faint). °This information is not intended to replace advice given to you by your health care provider. Make sure you discuss any questions you have with your health care provider. °Document Released: 06/13/2004 Document Revised: 10/12/2015 Document Reviewed: 10/21/2012 °Elsevier Interactive Patient Education © 2017 [employer] °Eating Plan for Hyperemesis Gravidarum °Hyperemesis gravidarum is a severe form of morning sickness. Because this condition causes severe nausea and vomiting, it can lead to dehydration, malnutrition, and weight loss. One way to lessen the symptoms of nausea and vomiting is to follow the eating plan for hyperemesis gravidarum. It is often used along with prescribed medicines to control your symptoms. °What can I do to relieve my symptoms? °Listen to your body. Everyone is different and has different preferences. Find what works best for you. Take any of the following actions that are helpful to you: °· Eat and drink slowly. °· Eat 5-6 small meals daily instead of 3 large meals. °· Eat crackers before you get out of bed in the morning. °· Try having a snack in the middle of the night. °· Starchy foods are usually tolerated well. Examples include cereal, toast, bread, potatoes, pasta, rice, and pretzels. °· Ginger may help with nausea. Add ¼ tsp ground ginger to hot tea or choose ginger tea. °· Try drinking 100% fruit juice or an electrolyte drink. An electrolyte drink contains sodium, potassium, and chloride. °· Continue to take your prenatal vitamins as told by your health care provider. If you are having trouble taking your prenatal vitamins, talk with your health care provider about different options. °· Include at least 1 serving of protein with your meals and snacks. Protein options include meats or poultry, beans, nuts, eggs, and yogurt. Try eating a protein-rich snack before bed. Examples   of these snacks include cheese and crackers or half of a peanut butter or turkey sandwich. °· Consider eliminating foods that trigger your symptoms. These may include spicy foods, coffee, high-fat foods, very sweet foods, and acidic foods. °· Try meals that have more protein combined with bland, salty, lower-fat, and dry foods, such as nuts, seeds, pretzels, crackers, and cereal. °· Talk with your healthcare provider  about starting a supplement of vitamin B6. °· Have fluids that are cold, clear, and carbonated or sour. Examples include lemonade, ginger ale, lemon-lime soda, ice water, and sparkling water. °· Try lemon or mint tea. °· Try brushing your teeth or using a mouth rinse after meals. ° °What should I avoid to reduce my symptoms? °Avoiding some of the following things may help reduce your symptoms. °· Foods with strong smells. Try eating meals in well-ventilated areas that are free of odors. °· Drinking water or other beverages with meals. Try not to drink anything during the 30 minutes before and after your meals. °· Drinking more than 1 cup of fluid at a time. Sometimes using a straw helps. °· Fried or high-fat foods, such as butter and cream sauces. °· Spicy foods. °· Skipping meals as best as you can. Nausea can be more intense on an empty stomach. If you cannot tolerate food at that time, do not force it. Try sucking on ice chips or other frozen items, and make up for missed calories later. °· Lying down within 2 hours after eating. °· Environmental triggers. These may include smoky rooms, closed spaces, rooms with strong smells, warm or humid places, overly loud and noisy rooms, and rooms with motion or flickering lights. °· Quick and sudden changes in your movement. ° °This information is not intended to replace advice given to you by your health care provider. Make sure you discuss any questions you have with your health care provider. °Document Released: 03/03/2007 Document Revised: 01/03/2016 Document Reviewed: 12/05/2015 °Elsevier Interactive Patient Education © 2018 Else[employer] Inc. ° °

## 2016-12-05 ENCOUNTER — Ambulatory Visit (INDEPENDENT_AMBULATORY_CARE_PROVIDER_SITE_OTHER): Payer: Medicaid Other | Admitting: Family Medicine

## 2016-12-05 ENCOUNTER — Other Ambulatory Visit (HOSPITAL_COMMUNITY)
Admission: RE | Admit: 2016-12-05 | Discharge: 2016-12-05 | Disposition: A | Discharge status: Home / Self Care | Payer: Medicaid Other | Source: Ambulatory Visit | Attending: Family Medicine | Admitting: Family Medicine

## 2016-12-05 ENCOUNTER — Encounter: Payer: Self-pay | Admitting: Family Medicine

## 2016-12-05 VITALS — BP 128/76 | HR 81 | Temp 98.0°F | Wt 143.4 lb

## 2016-12-05 DIAGNOSIS — O0941 Supervision of pregnancy with grand multiparity, first trimester: Secondary | ICD-10-CM | POA: Diagnosis not present

## 2016-12-05 DIAGNOSIS — O0940 Supervision of pregnancy with grand multiparity, unspecified trimester: Secondary | ICD-10-CM

## 2016-12-05 DIAGNOSIS — Z3490 Encounter for supervision of normal pregnancy, unspecified, unspecified trimester: Secondary | ICD-10-CM

## 2016-12-05 DIAGNOSIS — Z8639 Personal history of other endocrine, nutritional and metabolic disease: Secondary | ICD-10-CM | POA: Diagnosis not present

## 2016-12-05 DIAGNOSIS — Z87898 Personal history of other specified conditions: Secondary | ICD-10-CM

## 2016-12-05 DIAGNOSIS — Z3491 Encounter for supervision of normal pregnancy, unspecified, first trimester: Secondary | ICD-10-CM

## 2016-12-05 DIAGNOSIS — F1991 Other psychoactive substance use, unspecified, in remission: Secondary | ICD-10-CM

## 2016-12-05 HISTORY — DX: Supervision of pregnancy with grand multiparity, unspecified trimester: O09.40

## 2016-12-05 LAB — POCT WET PREP (WET MOUNT)
CLUE CELLS WET PREP WHIFF POC: NEGATIVE
Trichomonas Wet Prep HPF POC: ABSENT

## 2016-12-05 MED ORDER — SALINE SPRAY 0.65 % NA SOLN: 1.0000 | NASAL | 5 refills | Status: DC | PRN

## 2016-12-05 NOTE — Patient Instructions (Signed)
Thank you for coming in today, it was so nice to see you! Today we talked about:    Prenatal visit. Baby is doing well! To keep baby healthy continue taking her prenatal vitamins and stop smoking marijuana, cocaine, and cigarettes.   Please follow up in 4 weeks. You can schedule this appointment at the front desk before you leave or call the clinic.  If we ordered any tests today, you will be notified via telephone of any abnormalities. If everything is normal you will get a letter in the mail.   If you have any questions or concerns, please do not hesitate to call the office at [phone]. You can also message me directly via MyChart.   Sincerely,  Anders Simmonds, MD    Prenatal Care WHAT IS PRENATAL CARE? Prenatal care is the process of caring for a pregnant woman before she gives birth. Prenatal care makes sure that she and her baby remain as healthy as possible throughout pregnancy. Prenatal care may be provided by a midwife, family practice health care provider, or a childbirth and pregnancy specialist (obstetrician). Prenatal care may include physical examinations, testing, treatments, and education on nutrition, lifestyle, and social support services. WHY IS PRENATAL CARE SO IMPORTANT? Early and consistent prenatal care increases the chance that you and your baby will remain healthy throughout your pregnancy. This type of care also decreases a baby's risk of being born too early (prematurely), or being born smaller than expected (small for gestational age). Any underlying medical conditions you may have that could pose a risk during your pregnancy are discussed during prenatal care visits. You will also be monitored regularly for any new conditions that may arise during your pregnancy so they can be treated quickly and effectively. WHAT HAPPENS DURING PRENATAL CARE VISITS? Prenatal care visits may include the following: Discussion Tell your health care provider about any new  signs or symptoms you have experienced since your last visit. These might include:  Nausea or vomiting.  Increased or decreased level of energy.  Difficulty sleeping.  Back or leg pain.  Weight changes.  Frequent urination.  Shortness of breath with physical activity.  Changes in your skin, such as the development of a rash or itchiness.  Vaginal discharge or bleeding.  Feelings of excitement or nervousness.  Changes in your baby's movements.  You may want to write down any questions or topics you want to discuss with your health care provider and bring them with you to your appointment. Examination During your first prenatal care visit, you will likely have a complete physical exam. Your health care provider will often examine your vagina, cervix, and the position of your uterus, as well as check your heart, lungs, and other body systems. As your pregnancy progresses, your health care provider will measure the size of your uterus and your baby's position inside your uterus. He or she may also examine you for early signs of labor. Your prenatal visits may also include checking your blood pressure and, after about 10-12 weeks of pregnancy, listening to your baby's heartbeat. Testing Regular testing often includes:  Urinalysis. This checks your urine for glucose, protein, or signs of infection.  Blood count. This checks the levels of white and red blood cells in your body.  Tests for sexually transmitted infections (STIs). Testing for STIs at the beginning of pregnancy is routinely done and is required in many states.  Antibody testing. You will be checked to see if you are immune to certain illnesses,  such as rubella, that can affect a developing fetus.  Glucose screen. Around 24-28 weeks of pregnancy, your blood glucose level will be checked for signs of gestational diabetes. Follow-up tests may be recommended.  Group B strep. This is a bacteria that is commonly found inside a  woman's vagina. This test will inform your health care provider if you need an antibiotic to reduce the amount of this bacteria in your body prior to labor and childbirth.  Ultrasound. Many pregnant women undergo an ultrasound screening around 18-20 weeks of pregnancy to evaluate the health of the fetus and check for any developmental abnormalities.  HIV (human immunodeficiency virus) testing. Early in your pregnancy, you will be screened for HIV. If you are at high risk for HIV, this test may be repeated during your third trimester of pregnancy.  You may be offered other testing based on your age, personal or family medical history, or other factors. HOW OFTEN SHOULD I PLAN TO SEE MY HEALTH CARE PROVIDER FOR PRENATAL CARE? Your prenatal care check-up schedule depends on any medical conditions you have before, or develop during, your pregnancy. If you do not have any underlying medical conditions, you will likely be seen for checkups:  Monthly, during the first 6 months of pregnancy.  Twice a month during months 7 and 8 of pregnancy.  Weekly starting in the 9th month of pregnancy and until delivery.  If you develop signs of early labor or other concerning signs or symptoms, you may need to see your health care provider more often. Ask your health care provider what prenatal care schedule is best for you. WHAT CAN I DO TO KEEP MYSELF AND MY BABY AS HEALTHY AS POSSIBLE DURING MY PREGNANCY?  Take a prenatal vitamin containing 400 micrograms (0.4 mg) of folic acid every day. Your health care provider may also ask you to take additional vitamins such as iodine, vitamin D, iron, copper, and zinc.  Take 1500-2000 mg of calcium daily starting at your 20th week of pregnancy until you deliver your baby.  Make sure you are up to date on your vaccinations. Unless directed otherwise by your health care provider: ? You should receive a tetanus, diphtheria, and pertussis (Tdap) vaccination between the 27th  and 36th week of your pregnancy, regardless of when your last Tdap immunization occurred. This helps protect your baby from whooping cough (pertussis) after he or she is born. ? You should receive an annual inactivated influenza vaccine (IIV) to help protect you and your baby from influenza. This can be done at any point during your pregnancy.  Eat a well-rounded diet that includes: ? Fresh fruits and vegetables. ? Lean proteins. ? Calcium-rich foods such as milk, yogurt, hard cheeses, and dark, leafy greens. ? Whole grain breads.  Do noteat seafood high in mercury, including: ? Swordfish. ? Tilefish. ? Shark. ? King mackerel. ? More than 6 oz tuna per week.  Do not eat: ? Raw or undercooked meats or eggs. ? Unpasteurized foods, such as soft cheeses (brie, blue, or feta), juices, and milks. ? Lunch meats. ? Hot dogs that have not been heated until they are steaming.  Drink enough water to keep your urine clear or pale yellow. For many women, this may be 10 or more 8 oz glasses of water each day. Keeping yourself hydrated helps deliver nutrients to your baby and may prevent the start of pre-term uterine contractions.  Do not use any tobacco products including cigarettes, chewing tobacco, or electronic cigarettes. If  you need help quitting, ask your health care provider.  Do not drink beverages containing alcohol. No safe level of alcohol consumption during pregnancy has been determined.  Do not use any illegal drugs. These can harm your developing baby or cause a miscarriage.  Ask your health care provider or pharmacist before taking any prescription or over-the-counter medicines, herbs, or supplements.  Limit your caffeine intake to no more than 200 mg per day.  Exercise. Unless told otherwise by your health care provider, try to get 30 minutes of moderate exercise most days of the week. Do not  do high-impact activities, contact sports, or activities with a high risk of falling,  such as horseback riding or downhill skiing.  Get plenty of rest.  Avoid anything that raises your body temperature, such as hot tubs and saunas.  If you own a cat, do not empty its litter box. Bacteria contained in cat feces can cause an infection called toxoplasmosis. This can result in serious harm to the fetus.  Stay away from chemicals such as insecticides, lead, mercury, and cleaning or paint products that contain solvents.  Do not have any X-rays taken unless medically necessary.  Take a childbirth and breastfeeding preparation class. Ask your health care provider if you need a referral or recommendation.  This information is not intended to replace advice given to you by your health care provider. Make sure you discuss any questions you have with your health care provider. Document Released: 05/09/2003 Document Revised: 10/09/2015 Document Reviewed: 07/21/2013 Elsevier Interactive Patient Education  2017 [employer].

## 2016-12-05 NOTE — Progress Notes (Signed)
    Marcelle SmilingJessica M Erb is a 31 y.o. yo [phone] at 7047w5d who presents for her initial prenatal visit. Pregnancy  is not planned but welcomed She reports fatigue, morning sickness and nausea. She  is taking PNV. See flow sheet for details.  PMH, POBH, FH, meds, allergies and Social Hx reviewed.  Prenatal exam: Gen: Well nourished, well developed.  No distress.  Vitals noted. HEENT: Normocephalic, atraumatic.  Neck supple without cervical lymphadenopathy, thyromegaly or thyroid nodules.  fair dentition. CV: RRR no murmur, gallops or rubs Lungs: CTA B.  Normal respiratory effort without wheezes or rales. Abd: soft, NTND. +BS.  Uterus not appreciated above pelvis. GU: Normal external female genitalia without lesions.  Nl vaginal, well rugated without lesions. No vaginal discharge.  Bimanual exam: No adnexal mass or TTP. No CMT.  Uterus size below umbilicus Ext: No clubbing, cyanosis or edema. Psych: Normal grooming and dress.  Not depressed or anxious appearing.  Normal thought content and process without flight of ideas or looseness of associations  Assessment/plan: 1) Pregnancy 2947w5d doing well.  Current pregnancy issues include: Patient admits to marijuana, cigarettes, and cocaine (snorts). Notes that she has been trying to stop all the above 3. Notes the last time she used cocaine was 1 month ago. Provided extensive counseling on importance of cessation, patient expressed good understanding. Offered for patient to see our behavioral health consultant she said that she would rather wait.  Patient states previous 4 children were born at term without any complications. Has had 2 miscarriages.  Dating is reliable Prenatal labs have not been collected yet, labs were drawn today. Additional labs that were drawn today: Pap smear, GC chlamydia, wet prep, TSH, and drug screen.  Bleeding and pain precautions reviewed. Importance of prenatal vitamins reviewed.  Did not address genetic screening, can do  this at next visit.  Early glucola is not indicated.  Visit precepted with Dr. Deirdre Priesthambliss    Follow up 4 weeks.   Anders Simmondshristina Gambino, MD [facility], PGY-3

## 2016-12-06 LAB — CYTOLOGY - PAP
ADEQUACY: ABSENT
Chlamydia: NEGATIVE
DIAGNOSIS: NEGATIVE
HPV: NOT DETECTED
Neisseria Gonorrhea: NEGATIVE

## 2016-12-06 LAB — TOXASSURE SELECT 13 (MW), URINE

## 2016-12-07 LAB — OBSTETRIC PANEL, INCLUDING HIV
Antibody Screen: NEGATIVE
Basophils Absolute: 0 10*3/uL (ref 0.0–0.2)
Basos: 1 %
EOS (ABSOLUTE): 0.5 10*3/uL — AB (ref 0.0–0.4)
Eos: 6 %
HIV SCREEN 4TH GENERATION: NONREACTIVE
Hematocrit: 33.8 % — ABNORMAL LOW (ref 34.0–46.6)
Hemoglobin: 10.9 g/dL — ABNORMAL LOW (ref 11.1–15.9)
Hepatitis B Surface Ag: NEGATIVE
Immature Grans (Abs): 0 10*3/uL (ref 0.0–0.1)
Immature Granulocytes: 0 %
LYMPHS ABS: 2 10*3/uL (ref 0.7–3.1)
Lymphs: 23 %
MCH: 25.8 pg — AB (ref 26.6–33.0)
MCHC: 32.2 g/dL (ref 31.5–35.7)
MCV: 80 fL (ref 79–97)
MONOS ABS: 0.7 10*3/uL (ref 0.1–0.9)
Monocytes: 8 %
NEUTROS ABS: 5.5 10*3/uL (ref 1.4–7.0)
Neutrophils: 62 %
PLATELETS: 235 10*3/uL (ref 150–379)
RBC: 4.22 x10E6/uL (ref 3.77–5.28)
RDW: 14.2 % (ref 12.3–15.4)
RPR Ser Ql: NONREACTIVE
Rh Factor: POSITIVE
Rubella Antibodies, IGG: 4.68 index (ref >0.99)
WBC: 8.6 10*3/uL (ref 3.4–10.8)

## 2016-12-07 LAB — CULTURE, OB URINE

## 2016-12-07 LAB — URINE CULTURE, OB REFLEX

## 2016-12-07 LAB — TSH: TSH: 1.43 u[IU]/mL (ref 0.450–4.500)

## 2016-12-11 ENCOUNTER — Encounter: Payer: Self-pay | Admitting: Family Medicine

## 2017-01-02 ENCOUNTER — Ambulatory Visit (INDEPENDENT_AMBULATORY_CARE_PROVIDER_SITE_OTHER): Payer: Medicaid Other | Admitting: Family Medicine

## 2017-01-02 VITALS — BP 122/70 | HR 101 | Temp 99.3°F | Wt 148.0 lb

## 2017-01-02 DIAGNOSIS — O0942 Supervision of pregnancy with grand multiparity, second trimester: Secondary | ICD-10-CM | POA: Diagnosis not present

## 2017-01-02 NOTE — Patient Instructions (Signed)

## 2017-01-02 NOTE — Progress Notes (Signed)
Erika Porter is a 31 y.o. [phone] at 3524w5d for routine follow up.  She reports no complaints . She has not been consistently taking her PNV. Admits that she is cutting back smoking marijuana, she is down to "1 blunt a day". See flow sheet for details.  A/P: Pregnancy at 5224w5d.  Doing well.   Pregnancy issues include: Prior drug use. Patient states she has cut back on smoking.  Anatomy ultrasound ordered to be scheduled at 18-19 weeks.  Pt is interested in genetic screening. Quad screen ordered today  Bleeding and pain precautions reviewed. Marijuana abuse: Continues to smoke, but cutting back. Counseled on importance of quitting entirely  Follow up 4 weeks.   Anders Simmondshristina Gambino, MD [facility], PGY-3

## 2017-01-13 ENCOUNTER — Ambulatory Visit (HOSPITAL_COMMUNITY)
Admission: RE | Admit: 2017-01-13 | Discharge: 2017-01-13 | Disposition: A | Discharge status: Home / Self Care | Payer: Medicaid Other | Source: Ambulatory Visit | Attending: Family Medicine | Admitting: Family Medicine

## 2017-01-13 ENCOUNTER — Other Ambulatory Visit: Payer: Self-pay | Admitting: Family Medicine

## 2017-01-13 DIAGNOSIS — Z363 Encounter for antenatal screening for malformations: Secondary | ICD-10-CM | POA: Diagnosis not present

## 2017-01-13 DIAGNOSIS — O0942 Supervision of pregnancy with grand multiparity, second trimester: Secondary | ICD-10-CM | POA: Insufficient documentation

## 2017-01-13 DIAGNOSIS — Z369 Encounter for antenatal screening, unspecified: Secondary | ICD-10-CM

## 2017-01-13 DIAGNOSIS — Z3A20 20 weeks gestation of pregnancy: Secondary | ICD-10-CM | POA: Insufficient documentation

## 2017-02-13 ENCOUNTER — Encounter: Payer: Medicaid Other | Admitting: Internal Medicine

## 2017-02-18 ENCOUNTER — Encounter: Payer: Medicaid Other | Admitting: Internal Medicine

## 2017-03-10 ENCOUNTER — Inpatient Hospital Stay (HOSPITAL_COMMUNITY)
Admission: AD | Admit: 2017-03-10 | Discharge: 2017-03-10 | Disposition: A | Discharge status: Home / Self Care | Payer: Medicaid Other | Source: Ambulatory Visit | Attending: Obstetrics & Gynecology | Admitting: Obstetrics & Gynecology

## 2017-03-10 ENCOUNTER — Encounter (HOSPITAL_COMMUNITY): Payer: Self-pay

## 2017-03-10 DIAGNOSIS — O98813 Other maternal infectious and parasitic diseases complicating pregnancy, third trimester: Secondary | ICD-10-CM | POA: Diagnosis not present

## 2017-03-10 DIAGNOSIS — F17210 Nicotine dependence, cigarettes, uncomplicated: Secondary | ICD-10-CM | POA: Diagnosis not present

## 2017-03-10 DIAGNOSIS — Z3A28 28 weeks gestation of pregnancy: Secondary | ICD-10-CM | POA: Diagnosis not present

## 2017-03-10 DIAGNOSIS — O99333 Smoking (tobacco) complicating pregnancy, third trimester: Secondary | ICD-10-CM | POA: Insufficient documentation

## 2017-03-10 DIAGNOSIS — B373 Candidiasis of vulva and vagina: Secondary | ICD-10-CM | POA: Insufficient documentation

## 2017-03-10 DIAGNOSIS — O99323 Drug use complicating pregnancy, third trimester: Secondary | ICD-10-CM | POA: Diagnosis not present

## 2017-03-10 DIAGNOSIS — F1490 Cocaine use, unspecified, uncomplicated: Secondary | ICD-10-CM | POA: Diagnosis present

## 2017-03-10 DIAGNOSIS — R109 Unspecified abdominal pain: Secondary | ICD-10-CM

## 2017-03-10 DIAGNOSIS — O4703 False labor before 37 completed weeks of gestation, third trimester: Secondary | ICD-10-CM | POA: Diagnosis not present

## 2017-03-10 DIAGNOSIS — F1410 Cocaine abuse, uncomplicated: Secondary | ICD-10-CM | POA: Diagnosis not present

## 2017-03-10 DIAGNOSIS — B3731 Acute candidiasis of vulva and vagina: Secondary | ICD-10-CM

## 2017-03-10 DIAGNOSIS — O99320 Drug use complicating pregnancy, unspecified trimester: Secondary | ICD-10-CM

## 2017-03-10 DIAGNOSIS — O0933 Supervision of pregnancy with insufficient antenatal care, third trimester: Secondary | ICD-10-CM | POA: Diagnosis not present

## 2017-03-10 DIAGNOSIS — O26893 Other specified pregnancy related conditions, third trimester: Secondary | ICD-10-CM | POA: Diagnosis not present

## 2017-03-10 HISTORY — DX: Drug use complicating pregnancy, unspecified trimester: O99.320

## 2017-03-10 HISTORY — DX: Cocaine use, unspecified, uncomplicated: F14.90

## 2017-03-10 LAB — URINALYSIS, ROUTINE W REFLEX MICROSCOPIC
BILIRUBIN URINE: NEGATIVE
Glucose, UA: NEGATIVE mg/dL
Hgb urine dipstick: NEGATIVE
KETONES UR: NEGATIVE mg/dL
Nitrite: NEGATIVE
Protein, ur: 30 mg/dL — AB
SPECIFIC GRAVITY, URINE: 1.026 (ref 1.005–1.030)
pH: 6 (ref 5.0–8.0)

## 2017-03-10 LAB — RAPID URINE DRUG SCREEN, HOSP PERFORMED
AMPHETAMINES: NOT DETECTED
Barbiturates: NOT DETECTED
Benzodiazepines: NOT DETECTED
Cocaine: POSITIVE — AB
OPIATES: NOT DETECTED
Tetrahydrocannabinol: POSITIVE — AB

## 2017-03-10 LAB — GC/CHLAMYDIA PROBE AMP (~~LOC~~) NOT AT ARMC
CHLAMYDIA, DNA PROBE: NEGATIVE
NEISSERIA GONORRHEA: NEGATIVE

## 2017-03-10 LAB — WET PREP, GENITAL
CLUE CELLS WET PREP: NONE SEEN
SPERM: NONE SEEN
Trich, Wet Prep: NONE SEEN

## 2017-03-10 LAB — GLUCOSE, CAPILLARY: GLUCOSE-CAPILLARY: 81 mg/dL (ref 65–99)

## 2017-03-10 LAB — AMNISURE RUPTURE OF MEMBRANE (ROM) NOT AT ARMC: Amnisure ROM: NEGATIVE

## 2017-03-10 MED ORDER — TERCONAZOLE 0.4 % VA CREA: 1.0000 | TOPICAL_CREAM | Freq: Every day | VAGINAL | 0 refills | Status: DC

## 2017-03-10 NOTE — MAU Note (Signed)
Pt states she missed last 2 prenatal appointments because her ride did not show up

## 2017-03-10 NOTE — MAU Note (Signed)
Pt states she went to bathroom right before she got here and noticed a small amount of blood on tissue after wiping. Pt feels "tightening" in stomach for 2 days. States it is not painful. Reports good fetal movement. Pt states she has soreness when she wipes after using the bathroom. Pt denies recent intercourse or vaginal exams.

## 2017-03-10 NOTE — MAU Provider Note (Signed)
Chief Complaint:  Contractions and Vaginal Bleeding   First Provider Initiated Contact with Patient 03/10/17 0352      HPI: Erika Porter is a 31 y.o. [id number] at 63w2dwho presents to maternity admissions reporting abdominal pain and vaginal bleeding when wiping. She reports contractions that are not painful but are frequent x 3-4 days with onset of bright red bleeding when wiping today and a gush of fluid enough to wet her underwear tonight as well. She reports some vaginal pain/tenderness she noticed today as well. She has not tried any treatments. Nothing makes her symptoms better or worse. There are no other associated symptoms. Her last prenatal visit was 01/02/17 with [facility] and the pt reports transportation issues as the reason she has missed visits.   She reports good fetal movement, denies LOF, vaginal bleeding, vaginal itching/burning, urinary symptoms, h/a, dizziness, n/v, or fever/chills.    HPI  Past Medical History: Past Medical History:  Diagnosis Date  . Anemia   . BV (bacterial vaginosis)   . Chlamydia   . Headache(784.0)   . Infection    uti  . Trichomonas     Past obstetric history: OB History  Gravida Para Term Preterm AB Living  6 4 4  0 0 4  SAB TAB Ectopic Multiple Live Births  0 0 0 0 4    # Outcome Date GA Lbr Len/2nd Weight Sex Delivery Anes PTL Lv  6 Current           5 Term 07/16/13 [redacted]w[redacted]d 18:25 / 00:24 6 lb 10 oz (3.005 kg) M Vag-Spont EPI  LIV  4 Term 04/04/09 [redacted]w[redacted]d   F Vag-Spont EPI  LIV  3 Term 01/03/04 [redacted]w[redacted]d  7 lb 12 oz (3.515 kg) M Vag-Spont EPI  LIV  2 Term 11/01/00 [redacted]w[redacted]d 02:00 5 lb 6 oz (2.438 kg) M Vag-Spont EPI  LIV  1 Gravida               Past Surgical History: Past Surgical History:  Procedure Laterality Date  . NO PAST SURGERIES    . WISDOM TOOTH EXTRACTION      Family History: Family History  Problem Relation Age of Onset  . Hypertension Mother   . Anxiety disorder Mother   . Asthma Sister   . Asthma Daughter    . Asthma Son   . Cancer Maternal Aunt   . Heart disease Maternal Grandfather     Social History: Social History  Substance Use Topics  . Smoking status: Current Some Day Smoker    Packs/day: 0.10    Years: 4.00    Types: Cigarettes  . Smokeless tobacco: Never Used     Comment: quit with preg  . Alcohol use 0.0 oz/week     Comment: not while preg    Allergies: No Known Allergies  Meds:  Prescriptions Prior to Admission  Medication Sig Dispense Refill Last Dose  . acetaminophen (TYLENOL) 500 MG tablet Take 2 tablets (1,000 mg total) by mouth every 6 (six) hours as needed for headache. 30 tablet 0 Past Week at Unknown time  . Prenatal MV & Min w/FA-DHA (PRENATAL ADULT GUMMY/DHA/FA) 0.4-25 MG CHEW Chew 1 tablet by mouth daily. 30 tablet 9 Past Week at Unknown time  . sodium chloride (OCEAN) 0.65 % SOLN nasal spray Place 1 spray into both nostrils as needed for congestion. 1 Bottle 5     ROS:  Review of Systems  Constitutional: Negative for chills, fatigue and fever.  Eyes: Negative  for visual disturbance.  Respiratory: Negative for shortness of breath.   Cardiovascular: Negative for chest pain.  Gastrointestinal: Negative for abdominal pain, constipation, diarrhea, nausea and vomiting.  Genitourinary: Positive for pelvic pain, vaginal bleeding, vaginal discharge and vaginal pain.  Musculoskeletal: Negative for neck pain.  Neurological: Negative for dizziness and headaches.  Psychiatric/Behavioral: Negative.      I have reviewed patient's Past Medical Hx, Surgical Hx, Family Hx, Social Hx, medications and allergies.   Physical Exam   Patient Vitals for the past 24 hrs:  BP Temp Temp src Pulse Resp SpO2 Height Weight  03/10/17 0508 119/68 - - 81 18 - - -  03/10/17 0344 113/75 98.4 F (36.9 C) Oral 86 20 97 % - -  03/10/17 0335 - - - - - - 5\' 7"  (1.702 m) 143 lb (64.9 kg)   Constitutional: Well-developed, well-nourished female in no acute distress.  Cardiovascular:  normal rate Respiratory: normal effort GI: Abd soft, non-tender, gravid appropriate for gestational age.  MS: Extremities nontender, no edema, normal ROM Neurologic: Alert and oriented x 4.  GU: Neg CVAT.  PELVIC EXAM: Cervix pink, visually closed, without lesion, large amount thick curd-like discharge clinging to cervix and vaginal walls, no evidence of bleeding, vaginal walls and external genitalia normal   Dilation: Closed Effacement (%): 40 Exam by:: Misty Stanley Leftwich-Kirby CNM  No blood on glove following exam  FHT:  Baseline 145 , moderate variability, accelerations present, no decelerations Contractions: None on toco or to palpation   Labs: Results for orders placed or performed during the hospital encounter of 03/10/17 (from the past 24 hour(s))  Urinalysis, Routine w reflex microscopic     Status: Abnormal   Collection Time: 03/10/17  3:25 AM  Result Value Ref Range   Color, Urine YELLOW YELLOW   APPearance CLOUDY (A) CLEAR   Specific Gravity, Urine 1.026 1.005 - 1.030   pH 6.0 5.0 - 8.0   Glucose, UA NEGATIVE NEGATIVE mg/dL   Hgb urine dipstick NEGATIVE NEGATIVE   Bilirubin Urine NEGATIVE NEGATIVE   Ketones, ur NEGATIVE NEGATIVE mg/dL   Protein, ur 30 (A) NEGATIVE mg/dL   Nitrite NEGATIVE NEGATIVE   Leukocytes, UA MODERATE (A) NEGATIVE   RBC / HPF 0-5 0 - 5 RBC/hpf   WBC, UA 6-30 0 - 5 WBC/hpf   Bacteria, UA RARE (A) NONE SEEN   Squamous Epithelial / LPF 6-30 (A) NONE SEEN   Mucus PRESENT    Ca Oxalate Crys, UA PRESENT   Rapid urine drug screen (hospital performed)     Status: Abnormal   Collection Time: 03/10/17  3:25 AM  Result Value Ref Range   Opiates NONE DETECTED NONE DETECTED   Cocaine POSITIVE (A) NONE DETECTED   Benzodiazepines NONE DETECTED NONE DETECTED   Amphetamines NONE DETECTED NONE DETECTED   Tetrahydrocannabinol POSITIVE (A) NONE DETECTED   Barbiturates NONE DETECTED NONE DETECTED  Amnisure rupture of membrane (rom)not at [facility]     Status: None    Collection Time: 03/10/17  4:01 AM  Result Value Ref Range   Amnisure ROM NEGATIVE   Wet prep, genital     Status: Abnormal   Collection Time: 03/10/17  4:01 AM  Result Value Ref Range   Yeast Wet Prep HPF POC PRESENT (A) NONE SEEN   Trich, Wet Prep NONE SEEN NONE SEEN   Clue Cells Wet Prep HPF POC NONE SEEN NONE SEEN   WBC, Wet Prep HPF POC MODERATE (A) NONE SEEN   Sperm NONE SEEN  Glucose, capillary     Status: None   Collection Time: 03/10/17  4:16 AM  Result Value Ref Range   Glucose-Capillary 81 65 - 99 mg/dL   B/Positive/-- (16/1007/19 1521)  Imaging:  No results found.  MAU Course/MDM: I have ordered labs and reviewed results.  NST reviewed and reactive No evidence of bleeding on today's exam but clinical and lab evidence of yeast, will treat with Terazol 7 Rx No evidence of preterm labor or ROM UDS positive for cocaine tonight Discussed results with pt.  Pt admits that she uses cocaine, although less frequently in the pregnancy.  Offered treatment for pt and she desires contact information for substance abuse/counseling.  Discussed specific risks of cocaine in pregnancy including preterm labor and abruption Pt to seek help for substance abuse and to [facility] as soon as possible Pt discharge with strict preterm labor precautions.  Today's evaluation included a work-up for preterm labor which can be life-threatening for both mom and baby.  Assessment: 1. Vaginal candidiasis   2. Abdominal pain during pregnancy in third trimester   3. Limited prenatal care in third trimester   4. Threatened preterm labor, third trimester   5. Cocaine abuse affecting pregnancy in third trimester [facility](HCC)     Plan: Discharge home Labor precautions and fetal kick counts Follow-up Information    [facility]. Schedule an appointment as soon as possible for a visit.   Specialty:  Family Medicine Why:  As soon as possible to resume  prenatal care, return to MAU as needed for emergencies Contact information: [address] [id number] mc [place] [phone]       [facility] Follow up.   Specialty:  Pension scheme managerrofessional Counselor Contact information: [address] [place] [phone]          Allergies as of 03/10/2017   No Known Allergies     Medication List    TAKE these medications   acetaminophen 500 MG tablet Commonly known as:  TYLENOL Take 2 tablets (1,000 mg total) by mouth every 6 (six) hours as needed for headache.   Prenatal Adult Gummy/DHA/FA 0.4-25 MG Chew Chew 1 tablet by mouth daily.   sodium chloride 0.65 % Soln nasal spray Commonly known as:  OCEAN Place 1 spray into both nostrils as needed for congestion.   terconazole 0.4 % vaginal cream Commonly known as:  TERAZOL 7 Place 1 applicator vaginally at bedtime.       Sharen CounterLisa Leftwich-Kirby Certified Nurse-Midwife 03/10/2017 5:24 AM

## 2017-03-11 ENCOUNTER — Encounter: Payer: Self-pay | Admitting: Family Medicine

## 2017-03-11 LAB — CULTURE, OB URINE: CULTURE: NO GROWTH

## 2017-03-14 ENCOUNTER — Ambulatory Visit (INDEPENDENT_AMBULATORY_CARE_PROVIDER_SITE_OTHER): Payer: Medicaid Other | Admitting: Family Medicine

## 2017-03-14 VITALS — BP 104/64 | HR 99 | Temp 98.4°F | Wt 149.0 lb

## 2017-03-14 DIAGNOSIS — Z3492 Encounter for supervision of normal pregnancy, unspecified, second trimester: Secondary | ICD-10-CM | POA: Diagnosis not present

## 2017-03-14 LAB — POCT 1 HR PRENATAL GLUCOSE: GLUCOSE 1 HR PRENATAL, POC: 121 mg/dL

## 2017-03-14 NOTE — Progress Notes (Signed)
Dr. Myrtie SomanWarden,  Thank you for sharing information about this patient with me. To my knowledge, +Cocaine and Marijuana in a pregnant woman is not an indication for [facility] referral. However, I will recommend frequent U/S growth monitoring for this pregnancy given potential for IUR with the use of Cocaine and other substance use.  Also she will benefit from Social work referral, not so much to reprimand her but to offer referral to useful resources to help her through pregnancy and substance use.  Unfortunately Stanton KidneyDebra is not here today. I called the social work on call in the hospital but no response. I will copy Stanton KidneyDebra on this e-mail so she can offer insight to the process as well as Dr. Shawnie PonsPratt. Please see patient soon for counseling.    Anibal HendersonHey Debra,  Could you please provide an insight into this issue for us. What can we offer this patient in terms of social support?

## 2017-03-14 NOTE — Progress Notes (Signed)
Erika Porter is a 31 y.o. [id number] at 8050w6d here for routine follow up.  She reports that she was seen at the MAU on 10/22 due to some vaginal bleeding that she noticed when she wiped. Noted to have a yeast infection and was given cream. Reports no symptoms. Also tested positive for cocaine. Reports cocaine use on the weekend, has a beer or two on the weekends. Smoking tobacco daily 2 cigarettes and smoking weed a few days a week.  Has previously tested positive for cocaine in 2015 and 2017.  See flow sheet for details.  A/P: Pregnancy at 6350w6d.  Pregnancy issues. Feels movement, no additional bleeding, discharge or loss of fluids.   Infant feeding choice: bottle  Contraception choice: unsure Infant circumcision desired yes  Tdap was given today. 1 hour glucola, CBC, RPR, and HIV were done today.   Pregnancy medical home and PHQ-9 forms were not done today and reviewed.   Rh status was reviewed and patient does not need Rhogam.  Rhogam was not given today.   Childbirth and education classes were offered. Preterm labor and fetal movement precautions reviewed. Follow up 2 weeks.  Highly recommended that patient receive follow up visit in one week. Will forward to CSW Gavin PoundDeborah. Had long talk with patient about risks of substance use for her health and her child.   Will also be following up in 2 weeks for regular OB visit. Return precautions discussed.   [name] Myrtie SomanWarden, MD [facility] Resident PGY-2 03/14/2017 11:11 AM

## 2017-03-14 NOTE — Patient Instructions (Addendum)
Erika BumpsJessica, you were seen today for an OB check up. We checked your glucose levels and some other labs. I will call you with these results.   I will be placing a referral to the high risk OB clinic due to your drug use.   I am highly recommending that you follow up next week with me to discuss your drug use.   It was very nice seeing you today,  Reuel BoomDaniel L. Myrtie SomanWarden, MD [facility] Resident PGY-2 03/14/2017 10:04 AM     Please go to [facility] if you notice any of the following: - You feel a gush of fluid (as if your water broke) - You begin having painful contractions approximately every 3-5 minutes apart that make it difficult to breathe or walk - You are not feeling your baby move - You have vaginal bleeding  The address for [facility] is: [address]  [place], [place] [address] [phone]   I will see you back in 1 weeks to talk and in 2 weeks for your next prenatal visit.

## 2017-03-15 LAB — CBC
HEMOGLOBIN: 9.1 g/dL — AB (ref 11.1–15.9)
Hematocrit: 28.2 % — ABNORMAL LOW (ref 34.0–46.6)
MCH: 25.7 pg — AB (ref 26.6–33.0)
MCHC: 32.3 g/dL (ref 31.5–35.7)
MCV: 80 fL (ref 79–97)
Platelets: 213 10*3/uL (ref 150–379)
RBC: 3.54 x10E6/uL — AB (ref 3.77–5.28)
RDW: 14.9 % (ref 12.3–15.4)
WBC: 7.6 10*3/uL (ref 3.4–10.8)

## 2017-03-15 LAB — RPR: RPR: NONREACTIVE

## 2017-03-15 LAB — HIV ANTIBODY (ROUTINE TESTING W REFLEX): HIV Screen 4th Generation wRfx: NONREACTIVE

## 2017-03-20 ENCOUNTER — Encounter: Payer: Self-pay | Admitting: Licensed Clinical Social Worker

## 2017-03-20 ENCOUNTER — Encounter: Payer: Self-pay | Admitting: Family Medicine

## 2017-03-20 NOTE — Progress Notes (Signed)
Social work consult from Dr. Lum BabeEniola and Dr. Myrtie SomanWarden for advice with patient.  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  LCSW [place] out to [place] [facility] for consultation.  DSS is unable to get involved until the baby is born and has a positive UDS.  Patient has 4 other children (approx. ages 6415, 4312, 7 & 4) and a history with CPS. Information shared with PCP, Dr. Lum BabeEniola and Dr. Myrtie SomanWarden.  Plan: PCP may consult [facility] Monday when patient comes in for medical appointment for community support, therapy and substance treatment. LCSW will leave various resources with Carollee HerterShannon covering [facility] if patient is open and willing to receive community support.   Sammuel Hineseborah Corisa Montini, LCSW Licensed Clinical Social Worker [facility]   [phone] 1:59 PM

## 2017-03-23 NOTE — Progress Notes (Deleted)
   Subjective:   Patient ID: Erika Porter    DOB: Sep 16, 1985, 31 y.o. female   MRN: [id number]  Erika Porter is a 31 y.o. currently pregnant female with a history of substance abuse during pregnancy here for   Substance use during pregnancy - cocaine positive on 03/10/17.  Review of Systems:  Per HPI.   PMFSH: reviewed. Smoking status reviewed. Medications reviewed.  Objective:   LMP 08/24/2016 (Exact Date)  Vitals and nursing note reviewed.  General: well nourished, well developed, in no acute distress with non-toxic appearance HEENT: normocephalic, atraumatic, moist mucous membranes Neck: supple, non-tender without lymphadenopathy CV: regular rate and rhythm without murmurs, rubs, or gallops, no lower extremity edema Lungs: clear to auscultation bilaterally with normal work of breathing Abdomen: soft, non-tender, non-distended, no masses or organomegaly palpable, normoactive bowel sounds Skin: warm, dry, no rashes or lesions, cap refill < 2 seconds Extremities: warm and well perfused, normal tone MSK: Full ROM, strength intact, gait normal Neuro: Alert and oriented, speech normal  Assessment & Plan:   No problem-specific Assessment & Plan notes found for this encounter.  No orders of the defined types were placed in this encounter.  No orders of the defined types were placed in this encounter.   Ellwood DenseAlison Vici Novick, DO PGY-1, [facility] 03/23/2017 10:00 PM

## 2017-03-24 ENCOUNTER — Ambulatory Visit: Payer: Self-pay | Admitting: Family Medicine

## 2017-04-21 ENCOUNTER — Encounter: Payer: Medicaid Other | Admitting: Internal Medicine

## 2017-05-15 ENCOUNTER — Inpatient Hospital Stay (EMERGENCY_DEPARTMENT_HOSPITAL)
Admission: AD | Admit: 2017-05-15 | Discharge: 2017-05-15 | Disposition: A | Discharge status: Home / Self Care | Payer: Medicaid Other | Source: Ambulatory Visit | Attending: Obstetrics & Gynecology | Admitting: Obstetrics & Gynecology

## 2017-05-15 ENCOUNTER — Encounter (HOSPITAL_COMMUNITY): Payer: Self-pay | Admitting: *Deleted

## 2017-05-15 DIAGNOSIS — O212 Late vomiting of pregnancy: Secondary | ICD-10-CM | POA: Diagnosis not present

## 2017-05-15 DIAGNOSIS — O4703 False labor before 37 completed weeks of gestation, third trimester: Secondary | ICD-10-CM | POA: Diagnosis not present

## 2017-05-15 DIAGNOSIS — O219 Vomiting of pregnancy, unspecified: Secondary | ICD-10-CM | POA: Diagnosis present

## 2017-05-15 DIAGNOSIS — Z3A37 37 weeks gestation of pregnancy: Secondary | ICD-10-CM | POA: Diagnosis not present

## 2017-05-15 LAB — URINALYSIS, ROUTINE W REFLEX MICROSCOPIC
BILIRUBIN URINE: NEGATIVE
GLUCOSE, UA: NEGATIVE mg/dL
HGB URINE DIPSTICK: NEGATIVE
KETONES UR: NEGATIVE mg/dL
NITRITE: NEGATIVE
PH: 6 (ref 5.0–8.0)
Protein, ur: 30 mg/dL — AB
SPECIFIC GRAVITY, URINE: 1.028 (ref 1.005–1.030)

## 2017-05-15 MED ORDER — ONDANSETRON 4 MG PO TBDP: 4.0000 mg | ORAL_TABLET | Freq: Three times a day (TID) | ORAL | 0 refills | Status: DC | PRN

## 2017-05-15 MED ORDER — ONDANSETRON 8 MG PO TBDP
8.0000 mg | ORAL_TABLET | Freq: Once | ORAL | Status: AC
  Administered 2017-05-15: 8 mg via ORAL
  Filled 2017-05-15: qty 1

## 2017-05-15 MED ORDER — PHENOL 1.4 % MT LIQD
1.0000 | OROMUCOSAL | Status: DC | PRN
  Administered 2017-05-15: 1 via OROMUCOSAL
  Filled 2017-05-15: qty 177

## 2017-05-15 NOTE — MAU Provider Note (Signed)
History     CSN: [id number]  Arrival date and time: 05/15/17 [id number]  Chief Complaint  Patient presents with  . Sore Throat  . Contractions   [id number] @37 .5 wks here with vomiting. Sx started around 3am. Vomited for 3-4 hrs straight. Some hematemesis. Throat became sore since sx started. No diarrhea. No sick contacts. Denies VB or LOF. Feeling baby "ball up" occasionally. Review of her chart shows limited PNC (4 visits, last at 28 wks) and cocaine use. She reports transportation as reason for lapse in care. Last used marijuana today and cocaine 4 days ago, states "I'm trying to stay clean".   OB History    Gravida Para Term Preterm AB Living   6 4 4  0 1 4   SAB TAB Ectopic Multiple Live Births   1 0 0 0 4      Past Medical History:  Diagnosis Date  . Anemia   . BV (bacterial vaginosis)   . Chlamydia   . Headache(784.0)   . Infection    uti  . Trichomonas     Past Surgical History:  Procedure Laterality Date  . WISDOM TOOTH EXTRACTION      Family History  Problem Relation Age of Onset  . Hypertension Mother   . Anxiety disorder Mother   . Asthma Sister   . Asthma Daughter   . Asthma Son   . Cancer Maternal Aunt   . Heart disease Maternal Grandfather     Social History   Tobacco Use  . Smoking status: Current Some Day Smoker    Packs/day: 0.10    Years: 4.00    Pack years: 0.40    Types: Cigarettes  . Smokeless tobacco: Never Used  . Tobacco comment: quit with preg  Substance Use Topics  . Alcohol use: No    Alcohol/week: 0.0 oz    Frequency: Never    Comment: not while preg  . Drug use: Yes    Types: Marijuana, Cocaine    Comment: Last cocaine use 12/24, last marijuana today 12/27    Allergies: No Known Allergies  Medications Prior to Admission  Medication Sig Dispense Refill Last Dose  . acetaminophen (TYLENOL) 500 MG tablet Take 2 tablets (1,000 mg total) by mouth every 6 (six) hours as needed for headache. 30 tablet 0 Past Week at Unknown time   . Prenatal MV & Min w/FA-DHA (PRENATAL ADULT GUMMY/DHA/FA) 0.4-25 MG CHEW Chew 1 tablet by mouth daily. (Patient taking differently: Chew 3 tablets by mouth daily. ) 30 tablet 9 05/15/2017 at Unknown time  . sodium chloride (OCEAN) 0.65 % SOLN nasal spray Place 1 spray into both nostrils as needed for congestion. 1 Bottle 5 prn  . terconazole (TERAZOL 7) 0.4 % vaginal cream Place 1 applicator vaginally at bedtime. 45 g 0     Review of Systems  Constitutional: Negative for fever.  HENT: Positive for sore throat.   Gastrointestinal: Positive for nausea and vomiting. Negative for diarrhea.  Genitourinary: Negative for vaginal bleeding.   Physical Exam   Blood pressure 135/83, pulse 96, temperature 98.2 F (36.8 C), temperature source Oral, resp. rate 20, height 5\' 7"  (1.702 m), weight 150 lb (68 kg), last menstrual period 08/24/2016, SpO2 98 %.  Physical Exam  Constitutional: She is oriented to person, place, and time. She appears well-developed and well-nourished. No distress.  HENT:  Head: Normocephalic and atraumatic.  Mouth/Throat: Uvula is midline, oropharynx is clear and moist and mucous membranes are normal. No oropharyngeal exudate, posterior  oropharyngeal edema, posterior oropharyngeal erythema or tonsillar abscesses.  Respiratory: Effort normal. No respiratory distress.  GI: Soft. She exhibits no distension. There is no tenderness.  gravid  Genitourinary:  Genitourinary Comments: SVE 1/70/-, vtx  Musculoskeletal: Normal range of motion.  Neurological: She is alert and oriented to person, place, and time.  Skin: Skin is warm and dry.  Psychiatric: She has a normal mood and affect.  EFM: 135 bpm, mod variability, + accels, no decels Toco: irreg  Results for orders placed or performed during the hospital encounter of 05/15/17 (from the past 24 hour(s))  Urinalysis, Routine w reflex microscopic     Status: Abnormal   Collection Time: 05/15/17  7:06 AM  Result Value Ref Range    Color, Urine YELLOW YELLOW   APPearance HAZY (A) CLEAR   Specific Gravity, Urine 1.028 1.005 - 1.030   pH 6.0 5.0 - 8.0   Glucose, UA NEGATIVE NEGATIVE mg/dL   Hgb urine dipstick NEGATIVE NEGATIVE   Bilirubin Urine NEGATIVE NEGATIVE   Ketones, ur NEGATIVE NEGATIVE mg/dL   Protein, ur 30 (A) NEGATIVE mg/dL   Nitrite NEGATIVE NEGATIVE   Leukocytes, UA SMALL (A) NEGATIVE   RBC / HPF 0-5 0 - 5 RBC/hpf   WBC, UA 6-30 0 - 5 WBC/hpf   Bacteria, UA RARE (A) NONE SEEN   Squamous Epithelial / LPF 6-30 (A) NONE SEEN   Mucus PRESENT     MAU Course  Procedures Zofran ODT  MDM Labs ordered and reviewed. Zofran ordered. Transfer of care given to Jacqualyn PoseyDawson, CNM Dorrie Cocuzza, CNM  05/15/2017 10:30 AM  Chloraseptic throat spray -- improved throat pain   Assessment and Plan  Nausea/vomiting in pregnancy - Rx for Zofran 4 mg ODT - Information provided on N/V  - Keep scheduled appt - Patient verbalized an understanding of the plan of care and agrees.  Raelyn MoraRolitta Vedansh Kerstetter, CNM  05/15/2017 10:30 AM

## 2017-05-15 NOTE — MAU Note (Signed)
Pt reports sore throat since Sunday. No fever.

## 2017-05-16 ENCOUNTER — Encounter (HOSPITAL_COMMUNITY): Payer: Self-pay | Admitting: Anesthesiology

## 2017-05-16 ENCOUNTER — Inpatient Hospital Stay (HOSPITAL_COMMUNITY): Payer: Medicaid Other | Admitting: Anesthesiology

## 2017-05-16 ENCOUNTER — Inpatient Hospital Stay (HOSPITAL_COMMUNITY)
Admission: AD | Admit: 2017-05-16 | Discharge: 2017-05-18 | DRG: 807 | Disposition: A | Discharge status: Home / Self Care | Payer: Medicaid Other | Source: Ambulatory Visit | Attending: Obstetrics & Gynecology | Admitting: Obstetrics & Gynecology

## 2017-05-16 ENCOUNTER — Encounter (HOSPITAL_COMMUNITY): Payer: Self-pay | Admitting: *Deleted

## 2017-05-16 ENCOUNTER — Inpatient Hospital Stay (HOSPITAL_COMMUNITY)
Admission: AD | Admit: 2017-05-16 | Discharge: 2017-05-16 | Disposition: A | Discharge status: Home / Self Care | Payer: Medicaid Other | Source: Ambulatory Visit | Attending: Obstetrics & Gynecology | Admitting: Obstetrics & Gynecology

## 2017-05-16 DIAGNOSIS — O9902 Anemia complicating childbirth: Secondary | ICD-10-CM | POA: Diagnosis present

## 2017-05-16 DIAGNOSIS — O99324 Drug use complicating childbirth: Principal | ICD-10-CM | POA: Diagnosis present

## 2017-05-16 DIAGNOSIS — F1490 Cocaine use, unspecified, uncomplicated: Secondary | ICD-10-CM | POA: Diagnosis present

## 2017-05-16 DIAGNOSIS — O0930 Supervision of pregnancy with insufficient antenatal care, unspecified trimester: Secondary | ICD-10-CM

## 2017-05-16 DIAGNOSIS — F1290 Cannabis use, unspecified, uncomplicated: Secondary | ICD-10-CM | POA: Diagnosis present

## 2017-05-16 DIAGNOSIS — O219 Vomiting of pregnancy, unspecified: Secondary | ICD-10-CM

## 2017-05-16 DIAGNOSIS — O4703 False labor before 37 completed weeks of gestation, third trimester: Secondary | ICD-10-CM | POA: Diagnosis not present

## 2017-05-16 DIAGNOSIS — O471 False labor at or after 37 completed weeks of gestation: Secondary | ICD-10-CM

## 2017-05-16 DIAGNOSIS — O99334 Smoking (tobacco) complicating childbirth: Secondary | ICD-10-CM | POA: Diagnosis present

## 2017-05-16 DIAGNOSIS — D649 Anemia, unspecified: Secondary | ICD-10-CM | POA: Diagnosis present

## 2017-05-16 DIAGNOSIS — O212 Late vomiting of pregnancy: Secondary | ICD-10-CM | POA: Diagnosis not present

## 2017-05-16 DIAGNOSIS — F17210 Nicotine dependence, cigarettes, uncomplicated: Secondary | ICD-10-CM | POA: Diagnosis present

## 2017-05-16 DIAGNOSIS — Z3A37 37 weeks gestation of pregnancy: Secondary | ICD-10-CM

## 2017-05-16 DIAGNOSIS — O99320 Drug use complicating pregnancy, unspecified trimester: Secondary | ICD-10-CM | POA: Diagnosis present

## 2017-05-16 DIAGNOSIS — Z3483 Encounter for supervision of other normal pregnancy, third trimester: Secondary | ICD-10-CM | POA: Diagnosis present

## 2017-05-16 HISTORY — DX: Supervision of pregnancy with insufficient antenatal care, unspecified trimester: O09.30

## 2017-05-16 LAB — RAPID URINE DRUG SCREEN, HOSP PERFORMED
Amphetamines: NOT DETECTED
BARBITURATES: NOT DETECTED
Benzodiazepines: NOT DETECTED
Cocaine: POSITIVE — AB
Opiates: NOT DETECTED
TETRAHYDROCANNABINOL: POSITIVE — AB

## 2017-05-16 LAB — CBC
HEMATOCRIT: 28.7 % — AB (ref 36.0–46.0)
HEMOGLOBIN: 9.4 g/dL — AB (ref 12.0–15.0)
MCH: 25.9 pg — ABNORMAL LOW (ref 26.0–34.0)
MCHC: 32.8 g/dL (ref 30.0–36.0)
MCV: 79.1 fL (ref 78.0–100.0)
Platelets: 192 10*3/uL (ref 150–400)
RBC: 3.63 MIL/uL — ABNORMAL LOW (ref 3.87–5.11)
RDW: 14.2 % (ref 11.5–15.5)
WBC: 8.3 10*3/uL (ref 4.0–10.5)

## 2017-05-16 LAB — GROUP B STREP BY PCR: GROUP B STREP BY PCR: NEGATIVE

## 2017-05-16 LAB — TYPE AND SCREEN
ABO/RH(D): B POS
ANTIBODY SCREEN: NEGATIVE

## 2017-05-16 MED ORDER — LIDOCAINE HCL (PF) 1 % IJ SOLN
INTRAMUSCULAR | Status: AC
  Filled 2017-05-16: qty 30

## 2017-05-16 MED ORDER — OXYCODONE-ACETAMINOPHEN 5-325 MG PO TABS: 2.0000 | ORAL_TABLET | ORAL | Status: DC | PRN

## 2017-05-16 MED ORDER — OXYCODONE-ACETAMINOPHEN 5-325 MG PO TABS: 1.0000 | ORAL_TABLET | ORAL | Status: DC | PRN

## 2017-05-16 MED ORDER — EPHEDRINE 5 MG/ML INJ
10.0000 mg | INTRAVENOUS | Status: DC | PRN
  Filled 2017-05-16: qty 2

## 2017-05-16 MED ORDER — LACTATED RINGERS IV SOLN: 500.0000 mL | Freq: Once | INTRAVENOUS | Status: DC

## 2017-05-16 MED ORDER — LIDOCAINE HCL (PF) 1 % IJ SOLN
INTRAMUSCULAR | Status: DC | PRN
  Administered 2017-05-16 (×2): 4 mL via EPIDURAL

## 2017-05-16 MED ORDER — OXYTOCIN BOLUS FROM INFUSION
500.0000 mL | Freq: Once | INTRAVENOUS | Status: AC
  Administered 2017-05-16: 500 mL via INTRAVENOUS

## 2017-05-16 MED ORDER — LACTATED RINGERS IV SOLN
INTRAVENOUS | Status: DC
  Administered 2017-05-16: 22:00:00 via INTRAVENOUS

## 2017-05-16 MED ORDER — OXYTOCIN 40 UNITS IN LACTATED RINGERS INFUSION - SIMPLE MED
2.5000 [IU]/h | INTRAVENOUS | Status: DC
  Filled 2017-05-16: qty 1000

## 2017-05-16 MED ORDER — DIPHENHYDRAMINE HCL 50 MG/ML IJ SOLN: 12.5000 mg | INTRAMUSCULAR | Status: DC | PRN

## 2017-05-16 MED ORDER — ONDANSETRON HCL 4 MG/2ML IJ SOLN: 4.0000 mg | Freq: Four times a day (QID) | INTRAMUSCULAR | Status: DC | PRN

## 2017-05-16 MED ORDER — PHENYLEPHRINE 40 MCG/ML (10ML) SYRINGE FOR IV PUSH (FOR BLOOD PRESSURE SUPPORT)
80.0000 ug | PREFILLED_SYRINGE | INTRAVENOUS | Status: DC | PRN
  Filled 2017-05-16: qty 5

## 2017-05-16 MED ORDER — FENTANYL CITRATE (PF) 100 MCG/2ML IJ SOLN: 100.0000 ug | INTRAMUSCULAR | Status: DC | PRN

## 2017-05-16 MED ORDER — LACTATED RINGERS IV SOLN
500.0000 mL | Freq: Once | INTRAVENOUS | Status: AC
  Administered 2017-05-16: 500 mL via INTRAVENOUS

## 2017-05-16 MED ORDER — LIDOCAINE HCL (PF) 1 % IJ SOLN
30.0000 mL | INTRAMUSCULAR | Status: DC | PRN
  Filled 2017-05-16: qty 30

## 2017-05-16 MED ORDER — LACTATED RINGERS IV SOLN: 500.0000 mL | INTRAVENOUS | Status: DC | PRN

## 2017-05-16 MED ORDER — ACETAMINOPHEN 325 MG PO TABS: 650.0000 mg | ORAL_TABLET | ORAL | Status: DC | PRN

## 2017-05-16 MED ORDER — SOD CITRATE-CITRIC ACID 500-334 MG/5ML PO SOLN: 30.0000 mL | ORAL | Status: DC | PRN

## 2017-05-16 MED ORDER — PHENYLEPHRINE 40 MCG/ML (10ML) SYRINGE FOR IV PUSH (FOR BLOOD PRESSURE SUPPORT)
80.0000 ug | PREFILLED_SYRINGE | INTRAVENOUS | Status: DC | PRN
  Filled 2017-05-16: qty 10
  Filled 2017-05-16: qty 5

## 2017-05-16 MED ORDER — FENTANYL 2.5 MCG/ML BUPIVACAINE 1/10 % EPIDURAL INFUSION (WH - ANES)
14.0000 mL/h | INTRAMUSCULAR | Status: DC | PRN
  Administered 2017-05-16: 14 mL/h via EPIDURAL
  Filled 2017-05-16: qty 100

## 2017-05-16 NOTE — Anesthesia Preprocedure Evaluation (Signed)
Anesthesia Evaluation  Patient identified by MRN, date of birth, ID band Patient awake    Reviewed: Allergy & Precautions, Patient's Chart, lab work & pertinent test results  Airway Mallampati: II  TM Distance: >3 FB Neck ROM: Full    Dental no notable dental hx. (+) Teeth Intact   Pulmonary Current Smoker,    Pulmonary exam normal breath sounds clear to auscultation       Cardiovascular negative cardio ROS Normal cardiovascular exam Rhythm:Regular Rate:Normal     Neuro/Psych  Headaches, negative psych ROS   GI/Hepatic Neg liver ROS, GERD  ,  Endo/Other  negative endocrine ROS  Renal/GU negative Renal ROS  negative genitourinary   Musculoskeletal negative musculoskeletal ROS (+)   Abdominal   Peds  Hematology  (+) anemia ,   Anesthesia Other Findings   Reproductive/Obstetrics (+) Pregnancy                             Anesthesia Physical Anesthesia Plan  ASA: II  Anesthesia Plan: Epidural   Post-op Pain Management:    Induction:   PONV Risk Score and Plan:   Airway Management Planned: Natural Airway  Additional Equipment:   Intra-op Plan:   Post-operative Plan:   Informed Consent: I have reviewed the patients History and Physical, chart, labs and discussed the procedure including the risks, benefits and alternatives for the proposed anesthesia with the patient or authorized representative who has indicated his/her understanding and acceptance.     Plan Discussed with: Anesthesiologist  Anesthesia Plan Comments:         Anesthesia Quick Evaluation

## 2017-05-16 NOTE — MAU Note (Signed)
Arrived EMS pt c/o contractions all morning got worse around 1pm. Denies any vag bleeding or leaking a this time.

## 2017-05-16 NOTE — MAU Note (Signed)
Pt reports contractions closer and more painful. deneis any vag bleeding or leaking.

## 2017-05-16 NOTE — H&P (Signed)
LABOR AND DELIVERY ADMISSION HISTORY AND PHYSICAL NOTE  Erika Porter is a 31 y.o. female [id number] with IUP at 1019w6d by LMP presenting for SOL.  She reports positive fetal movement. She denies leakage of fluid or vaginal bleeding.  Prenatal History/Complications: [facility] Pregnancy complications:  - Limited PNC (2 visits) - Cocaine and marijuana use  Past Medical History: Past Medical History:  Diagnosis Date  . Anemia   . BV (bacterial vaginosis)   . Chlamydia   . Headache(784.0)   . Infection    uti  . Trichomonas     Past Surgical History: Past Surgical History:  Procedure Laterality Date  . WISDOM TOOTH EXTRACTION      Obstetrical History: OB History    Gravida Para Term Preterm AB Living   6 4 4  0 1 4   SAB TAB Ectopic Multiple Live Births   1 0 0 0 4      Social History: Social History   Socioeconomic History  . Marital status: Single    Spouse name: Not on file  . Number of children: Not on file  . Years of education: Not on file  . Highest education level: Not on file  Social Needs  . Financial resource strain: Not on file  . Food insecurity - worry: Not on file  . Food insecurity - inability: Not on file  . Transportation needs - medical: Not on file  . Transportation needs - non-medical: Not on file  Occupational History  . Not on file  Tobacco Use  . Smoking status: Current Some Day Smoker    Packs/day: 0.10    Years: 4.00    Pack years: 0.40    Types: Cigarettes  . Smokeless tobacco: Never Used  . Tobacco comment: quit with preg  Substance and Sexual Activity  . Alcohol use: No    Alcohol/week: 0.0 oz    Frequency: Never    Comment: not while preg  . Drug use: Yes    Types: Marijuana, Cocaine    Comment: Last cocaine use 12/24, last marijuana today 12/27  . Sexual activity: Yes    Comment: last sexual contact 3 weeks ago  Other Topics Concern  . Not on file  Social History Narrative  . Not on file    Family History: Family  History  Problem Relation Age of Onset  . Hypertension Mother   . Anxiety disorder Mother   . Asthma Sister   . Asthma Daughter   . Asthma Son   . Cancer Maternal Aunt   . Heart disease Maternal Grandfather     Allergies: No Known Allergies  Medications Prior to Admission  Medication Sig Dispense Refill Last Dose  . acetaminophen (TYLENOL) 500 MG tablet Take 2 tablets (1,000 mg total) by mouth every 6 (six) hours as needed for headache. 30 tablet 0 Past Week at Unknown time  . ondansetron (ZOFRAN ODT) 4 MG disintegrating tablet Take 1 tablet (4 mg total) by mouth every 8 (eight) hours as needed for nausea or vomiting. 15 tablet 0   . Prenatal MV & Min w/FA-DHA (PRENATAL ADULT GUMMY/DHA/FA) 0.4-25 MG CHEW Chew 1 tablet by mouth daily. (Patient taking differently: Chew 3 tablets by mouth daily. ) 30 tablet 9 05/15/2017 at Unknown time  . sodium chloride (OCEAN) 0.65 % SOLN nasal spray Place 1 spray into both nostrils as needed for congestion. 1 Bottle 5 prn     Review of Systems  All systems reviewed and negative except  as stated in HPI  Physical Exam Blood pressure 130/77, pulse 100, last menstrual period 08/24/2016. General appearance: alert, oriented. Breathing through contractions Lungs: normal respiratory effort Heart: regular rate Abdomen: soft, non-tender; gravid, FH appropriate for GA Extremities: No calf swelling or tenderness Presentation: cephalic by SVE Fetal monitoring: baseline rate 145, moderate variability, +acel, no decel Uterine activity: ctx q ~612min Dilation: 4.5 Effacement (%): 80 Station: -2 Exam by:: K.Wilson,RN  Prenatal labs: ABO, Rh: B/Positive/-- (07/19 1521) Antibody: Negative (07/19 1521) Rubella: 4.68 (07/19 1521) RPR: Non Reactive (10/26 1024)  HBsAg: Negative (07/19 1521)  HIV: Non Reactive (10/26 1024)  GC/Chlamydia: negative (03/10/17) GBS:   unknown  GLucola: 121 Genetic screening:  Low risk Quad screen Anatomy US: normal  female  Prenatal Transfer Tool  Maternal Diabetes: No Genetic Screening: Normal Maternal Ultrasounds/Referrals: Normal Fetal Ultrasounds or other Referrals:  None Maternal Substance Abuse:  Yes:  Type: Marijuana, Cocaine Significant Maternal Medications:  None Significant Maternal Lab Results: positive UDS for cocaine and THC  Results for orders placed or performed during the hospital encounter of 05/16/17 (from the past 24 hour(s))  Urine rapid drug screen (hosp performed)   Collection Time: 05/16/17  2:58 PM  Result Value Ref Range   Opiates NONE DETECTED NONE DETECTED   Cocaine POSITIVE (A) NONE DETECTED   Benzodiazepines NONE DETECTED NONE DETECTED   Amphetamines NONE DETECTED NONE DETECTED   Tetrahydrocannabinol POSITIVE (A) NONE DETECTED   Barbiturates NONE DETECTED NONE DETECTED    Assessment: Erika Porter is a 31 y.o. [id number] at 3070w6d here for SOL  #Labor: Expectant mangement #Pain: Pt will get epidural #FWB: Cat I #ID:  GBS unkown; will get PCR. No IAP indicated at this time #MOF: bottle #MOC:Nexplanon #Circ:  outpatient  Kandra NicolasJulie P Jamille Fisher 05/16/2017, 8:36 PM

## 2017-05-16 NOTE — Progress Notes (Signed)
SVE 6/80/-2 AROM, light mec IUPC placed  FHT Cat I  Kandra NicolasJulie P. Adaia Matthies, MD OB Fellow

## 2017-05-16 NOTE — Progress Notes (Signed)
Pt informed she may ambulate.  Instructed to return to unit @ 1610 for re-val or sooner if ROM or VB.  Pt verbalized understanding & agreeable.

## 2017-05-16 NOTE — Anesthesia Procedure Notes (Signed)
Epidural Patient location during procedure: OB Start time: 05/16/2017 9:10 PM  Staffing Anesthesiologist: Mal AmabileFoster, Evian Derringer, MD Performed: anesthesiologist   Preanesthetic Checklist Completed: patient identified, site marked, surgical consent, pre-op evaluation, timeout performed, IV checked, risks and benefits discussed and monitors and equipment checked  Epidural Patient position: sitting Prep: site prepped and draped and DuraPrep Patient monitoring: continuous pulse ox and blood pressure Approach: midline Location: L3-L4 Injection technique: LOR air  Needle:  Needle type: Tuohy  Needle gauge: 17 G Needle length: 9 cm and 9 Needle insertion depth: 5 cm cm Catheter type: closed end flexible Catheter size: 19 Gauge Catheter at skin depth: 10 cm Test dose: negative and Other  Assessment Events: blood not aspirated, injection not painful, no injection resistance, negative IV test and no paresthesia  Additional Notes Patient identified. Risks and benefits discussed including failed block, incomplete  Pain control, post dural puncture headache, nerve damage, paralysis, blood pressure Changes, nausea, vomiting, reactions to medications-both toxic and allergic and post Partum back pain. All questions were answered. Patient expressed understanding and wished to proceed. Sterile technique was used throughout procedure. Epidural site was Dressed with sterile barrier dressing. No paresthesias, signs of intravascular injection Or signs of intrathecal spread were encountered.  Patient was more comfortable after the epidural was dosed. Please see RN's note for documentation of vital signs and FHR which are stable.

## 2017-05-16 NOTE — Discharge Instructions (Signed)

## 2017-05-16 NOTE — MAU Note (Addendum)
I have communicated with K. Clelia CroftShaw, CNM and reviewed vital signs:  Vitals:   05/16/17 1507 05/16/17 1654  BP: 128/75 124/77  Pulse: 71 72  Resp: 20 18  Temp:  98.1 F (36.7 C)    Vaginal exam:  Dilation: 3.5 Effacement (%): 80 Cervical Position: Posterior Station: -2 Presentation: Vertex Exam by:: A. Millner, RN,   Also reviewed contraction pattern and that non-stress test is reactive.  It has been documented that patient is contracting every 6-7 minutes per pt report  With no cervical change over 3 hours not indicating active labor.  Patient denies any other complaints.  Based on this report provider has given order for discharge.  A discharge order and diagnosis entered by a provider.   Labor discharge instructions reviewed with patient.

## 2017-05-16 NOTE — Anesthesia Pain Management Evaluation Note (Signed)
  CRNA Pain Management Visit Note  Patient: Erika Porter, 31 y.o., female  "Hello I am a member of the anesthesia team at [facility]. We have an anesthesia team available at all times to provide care throughout the hospital, including epidural management and anesthesia for C-section. I don't know your plan for the delivery whether it a natural birth, water birth, IV sedation, nitrous supplementation, doula or epidural, but we want to meet your pain goals."   1.Was your pain managed to your expectations on prior hospitalizations?   Yes   2.What is your expectation for pain management during this hospitalization?     Epidural  3.How can we help you reach that goal? epidural  Record the patient's initial score and the patient's pain goal.   Pain: 10  Pain Goal: 5 [facility] wants you to be able to say your pain was always managed very well.  Sundus Pete 05/16/2017

## 2017-05-17 ENCOUNTER — Encounter (HOSPITAL_COMMUNITY): Payer: Self-pay

## 2017-05-17 LAB — RPR: RPR Ser Ql: NONREACTIVE

## 2017-05-17 MED ORDER — DIPHENHYDRAMINE HCL 25 MG PO CAPS: 25.0000 mg | ORAL_CAPSULE | Freq: Four times a day (QID) | ORAL | Status: DC | PRN

## 2017-05-17 MED ORDER — PRENATAL MULTIVITAMIN CH
1.0000 | ORAL_TABLET | Freq: Every day | ORAL | Status: DC
  Administered 2017-05-17: 1 via ORAL
  Filled 2017-05-17: qty 1

## 2017-05-17 MED ORDER — ONDANSETRON HCL 4 MG/2ML IJ SOLN: 4.0000 mg | INTRAMUSCULAR | Status: DC | PRN

## 2017-05-17 MED ORDER — IBUPROFEN 600 MG PO TABS
600.0000 mg | ORAL_TABLET | Freq: Four times a day (QID) | ORAL | Status: DC
  Administered 2017-05-17 – 2017-05-18 (×5): 600 mg via ORAL
  Filled 2017-05-17 (×5): qty 1

## 2017-05-17 MED ORDER — ONDANSETRON HCL 4 MG PO TABS: 4.0000 mg | ORAL_TABLET | ORAL | Status: DC | PRN

## 2017-05-17 MED ORDER — DIBUCAINE 1 % RE OINT: 1.0000 "application " | TOPICAL_OINTMENT | RECTAL | Status: DC | PRN

## 2017-05-17 MED ORDER — ACETAMINOPHEN 325 MG PO TABS
650.0000 mg | ORAL_TABLET | ORAL | Status: DC | PRN
  Administered 2017-05-17 (×4): 650 mg via ORAL
  Filled 2017-05-17 (×4): qty 2

## 2017-05-17 MED ORDER — SENNOSIDES-DOCUSATE SODIUM 8.6-50 MG PO TABS
2.0000 | ORAL_TABLET | ORAL | Status: DC
  Administered 2017-05-17 (×2): 2 via ORAL
  Filled 2017-05-17 (×2): qty 2

## 2017-05-17 MED ORDER — WITCH HAZEL-GLYCERIN EX PADS: 1.0000 "application " | MEDICATED_PAD | CUTANEOUS | Status: DC | PRN

## 2017-05-17 MED ORDER — OXYCODONE HCL 5 MG PO TABS
5.0000 mg | ORAL_TABLET | ORAL | Status: DC | PRN
  Administered 2017-05-17 (×4): 5 mg via ORAL
  Filled 2017-05-17 (×4): qty 1

## 2017-05-17 MED ORDER — COCONUT OIL OIL: 1.0000 "application " | TOPICAL_OIL | Status: DC | PRN

## 2017-05-17 MED ORDER — BENZOCAINE-MENTHOL 20-0.5 % EX AERO: 1.0000 "application " | INHALATION_SPRAY | CUTANEOUS | Status: DC | PRN

## 2017-05-17 MED ORDER — ZOLPIDEM TARTRATE 5 MG PO TABS: 5.0000 mg | ORAL_TABLET | Freq: Every evening | ORAL | Status: DC | PRN

## 2017-05-17 MED ORDER — TETANUS-DIPHTH-ACELL PERTUSSIS 5-2.5-18.5 LF-MCG/0.5 IM SUSP: 0.5000 mL | Freq: Once | INTRAMUSCULAR | Status: DC

## 2017-05-17 MED ORDER — SIMETHICONE 80 MG PO CHEW
80.0000 mg | CHEWABLE_TABLET | ORAL | Status: DC | PRN
  Administered 2017-05-17: 80 mg via ORAL
  Filled 2017-05-17: qty 1

## 2017-05-17 NOTE — Clinical Social Work Note (Signed)
LCSW spoke with Ieshia Young of [place] C[place] to report drug exposed newborn.             

## 2017-05-17 NOTE — Progress Notes (Signed)
Pt scored 21 on edin depression scale. 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 SW consult ordered. Will cont to monitor.

## 2017-05-17 NOTE — Anesthesia Postprocedure Evaluation (Signed)
Anesthesia Post Note  Patient: Marcelle SmilingJessica M Gebhart  Procedure(s) Performed: AN AD HOC LABOR EPIDURAL     Patient location during evaluation: Mother Baby Anesthesia Type: Epidural Level of consciousness: awake and alert, oriented and patient cooperative Pain management: pain level controlled Vital Signs Assessment: post-procedure vital signs reviewed and stable Respiratory status: spontaneous breathing Cardiovascular status: stable Postop Assessment: no headache, epidural receding, patient able to bend at knees and no signs of nausea or vomiting Anesthetic complications: no Comments: Pain score 5.    Last Vitals:  Vitals:   05/17/17 0145 05/17/17 0449  BP: 121/68 136/86  Pulse: 67 68  Resp: 16 16  Temp: 36.9 C 36.7 C  SpO2: 96%     Last Pain:  Vitals:   05/17/17 0637  TempSrc:   PainSc: 2    Pain Goal: Patients Stated Pain Goal: 0 (05/17/17 0449)               Merrilyn PumaWRINKLE,Aadhira Heffernan

## 2017-05-17 NOTE — Progress Notes (Signed)
Post Partum Day #1 Subjective: no complaints, up ad lib and tolerating PO; bottlefeeding; desires Nexplanon for contraception  Objective: Blood pressure 136/86, pulse 68, temperature 98 F (36.7 C), temperature source Oral, resp. rate 16, height 5\' 7"  (1.702 m), weight 68 kg (150 lb), last menstrual period 08/24/2016, SpO2 96 %, unknown if currently breastfeeding.  Physical Exam:  General: alert, cooperative and no distress Lochia: appropriate Uterine Fundus: firm DVT Evaluation: No evidence of DVT seen on physical exam.  Recent Labs    05/16/17 2024  HGB 9.4*  HCT 28.7*    Assessment/Plan: Plan for discharge tomorrow and Social Work consult due to +cocaine   LOS: 1 day   Erika Porter, Erika Porter CNM 05/17/2017, 7:34 AM

## 2017-05-17 NOTE — Clinical Social Work Maternal (Signed)
CLINICAL SOCIAL WORK MATERNAL/CHILD NOTE  Patient Details  Name: Erika Porter Porter MRN: [id number] Date of Birth: 10/07/1985  Date:  05/17/2017  Clinical Social Worker Initiating Note:  Erika Porter Tirado LCSW       Date/Time: Initiated:  05/17/17/         Child's Name:  Erika Porter boy (sunny)   Biological Parents:  Mother, Father   Need for Interpreter:  None   Reason for Referral:  Current Substance Use/Substance Use During Pregnancy    Address:  [address] [place] [address]    Phone number:  [phone] (home)     Additional phone number:  Household Members/Support Persons (HM/SP):       HM/SP Name Relationship DOB or Age  HM/SP -1     HM/SP -2     HM/SP -3     HM/SP -4     HM/SP -5     HM/SP -6     HM/SP -7     HM/SP -8       Natural Supports (not living in the home): Immediate Family, Spouse/significant other   Professional Supports:Therapist(Erika Porter Porter therapist in past)   Employment:Unemployed   Type of Work:     Education:  High school graduate   Homebound arranged:    Financial Resources:Medicaid   Other Resources: Food Stamps , WIC   Cultural/Religious Considerations Which May Impact Care: Christian  Strengths: Home prepared for child    Psychotropic Medications:         Pediatrician:       Pediatrician List:   [place]   [place]   [place]   [place]   [place]   [place]     Pediatrician Fax Number:    Risk Factors/Current Problems: Substance Use    Cognitive State: Able to Concentrate , Alert    Mood/Affect: Calm    CSW Assessment:LCSW met with patient, her newborn and father of newborn at bedside to assess for services.  MOB was cooperative and calm and allowed LCSW to speak in front of FOB.  MOB reported that this was her 4th baby stating she had a 16 year old boy, a 31 year old boy, an 31 year old girl and a 31 year old boy that lived with  her.  MOB reported that FOB did not live in the home with her but was there all the time and was supportive.  MOB reported that her mother and sister were helping to care for her other children while she was at the hospital.  MOB reported that she used cocaine once a week and stated that she had told the FOB about her cocaine use.  MOB reported that her last baby (Erika Porter Porter boy 07/16/13) had tested positive for cocaine as well stating that she was allowed to take him home, CPS was involved and the case was closed within 30 days.  MOB reported that she had been seeing a therapist (Erika Porter Porter) since she was a child but had lost her vehicle so she d had stopped seeing her for awhile.  MOB reported that she would reach out to her therapist again if needed stating she was a good support.  MOB reported that she received WIC and food stamps and was planning on bottle feeding her newborn.  LCSW explained the drug exposed newborn policy and discussed how CPS would be notified about her positive labs reflecting THC and Cocaine and that the newborn cord blood had been sent out for   further screening and follow up.  MOB reported that she had all of the equipment needed to take her newborn home and declined any need for other services.  LCSW will follow up with referral to [place] C[place] regarding drug exposed newborn.    CSW Plan/Description: CSW Awaiting CPS Disposition Plan    Erika Porter Porter Erika Porter Monger, LCSW 05/17/2017, 11:18 AM             

## 2017-05-18 ENCOUNTER — Other Ambulatory Visit: Payer: Self-pay

## 2017-05-18 MED ORDER — IBUPROFEN 600 MG PO TABS: 600.0000 mg | ORAL_TABLET | Freq: Four times a day (QID) | ORAL | 0 refills | Status: DC

## 2017-05-18 NOTE — Discharge Summary (Signed)
OB Discharge Summary  Patient Name: Erika Porter DOB: 31-Jan-1986 MRN: [id number]  Date of admission: 05/16/2017 Delivering MD: Frederik PearEGELE, JULIE P   Date of discharge: 05/18/2017  Admitting diagnosis: ctx Intrauterine pregnancy: 4958w6d     Secondary diagnosis:Principal Problem:   SVD (spontaneous vaginal delivery) Active Problems:   Cocaine abuse affecting pregnancy in third trimester (HCC)   Normal labor   Limited prenatal care  Additional problems:none     Discharge diagnosis: Term Pregnancy Delivered and drug use in preg                                                                     Post partum procedures:n/a  Augmentation: n/a  Complications: None  Hospital course:  Onset of Labor With Vaginal Delivery     31 y.o. yo [id number] at 5958w6d was admitted in Active Labor on 05/16/2017. Patient had an uncomplicated labor course as follows:  Membrane Rupture Time/Date: 10:16 PM ,05/17/2017   Intrapartum Procedures: Episiotomy: None [1]                                         Lacerations:  Periurethral [8]  Patient had a delivery of a Viable infant. 05/16/2017  Information for the patient's newborn:  Margit HanksCook, Boy Freeda [[id number]][[id number]]  Delivery Method: Vag-Spont    Pateint had an uncomplicated postpartum course.  She is ambulating, tolerating a regular diet, passing flatus, and urinating well. Patient is discharged home in stable condition on 05/18/17.   Physical exam  Vitals:   05/17/17 0145 05/17/17 0449 05/17/17 0822 05/17/17 2011  BP: 121/68 136/86 (!) 154/90 126/65  Pulse: 67 68 65 61  Resp: 16 16 18 18   Temp: 98.5 F (36.9 C) 98 F (36.7 C) 98.6 F (37 C) 98.1 F (36.7 C)  TempSrc: Oral Oral Oral Oral  SpO2: 96%   97%  Weight:      Height:       General: alert, cooperative and no distress Lochia: appropriate Uterine Fundus: firm Incision: N/A DVT Evaluation: No evidence of DVT seen on physical exam. Labs: Lab Results  Component Value Date   WBC  8.3 05/16/2017   HGB 9.4 (L) 05/16/2017   HCT 28.7 (L) 05/16/2017   MCV 79.1 05/16/2017   PLT 192 05/16/2017   CMP Latest Ref Rng & Units 03/25/2014  Glucose 70 - 99 mg/dL 94  BUN 6 - 23 mg/dL 7  Creatinine 9.520.50 - 8.411.10 mg/dL 3.240.74  Sodium 401137 - 027147 mEq/L 140  Potassium 3.7 - 5.3 mEq/L 4.2  Chloride 96 - 112 mEq/L 104  CO2 19 - 32 mEq/L 23  Calcium 8.4 - 10.5 mg/dL 9.0  Total Protein 6.0 - 8.3 g/dL 7.3  Total Bilirubin 0.3 - 1.2 mg/dL <2.5(D<0.2(L)  Alkaline Phos 39 - 117 U/L 70  AST 0 - 37 U/L 21  ALT 0 - 35 U/L 21    Discharge instruction: per After Visit Summary and "Baby and Me Booklet".  After Visit Meds:  Allergies as of 05/18/2017   No Known Allergies     Medication List    TAKE these medications  acetaminophen 500 MG tablet Commonly known as:  TYLENOL Take 2 tablets (1,000 mg total) by mouth every 6 (six) hours as needed for headache. What changed:  reasons to take this   ibuprofen 600 MG tablet Commonly known as:  ADVIL,MOTRIN Take 1 tablet (600 mg total) by mouth every 6 (six) hours.   Prenatal Adult Gummy/DHA/FA 0.4-25 MG Chew Chew 1 tablet by mouth daily. What changed:  how much to take       Diet: routine diet  Activity: Advance as tolerated. Pelvic rest for 6 weeks.   Outpatient follow up:6 weeks Follow up Appt:No future appointments. Follow up visit: No Follow-up on file.  Postpartum contraception: Undecided  Newborn Data: Live born female  Birth Weight: 6 lb 9.8 oz (3000 g) APGAR: 9, 9  Newborn Delivery   Birth date/time:  05/16/2017 22:50:00 Delivery type:  Vaginal, Spontaneous     Baby Feeding: Bottle Disposition:home with mother   05/18/2017 Wyvonnia DuskyMarie Tetsuo Coppola, CNM

## 2017-05-18 NOTE — Clinical Social Work Note (Addendum)
LCSW received consult due to score of 21 on Edinburgh Depression Screen.   LCSW met with MOB for second time to assess her current emotional functioning.  MOB reported being in a "low place" in the middle of her pregnancy where she admitted she had passive thoughts of not wanting to be here but no plans.  MOB reported that this last pregnancy she was sicker than she had been with her last and she could not work which stressed her out.    MOB reported that she never had any ideas or plans to hurt herself stating "I love my kids and would never leave them."  MOB reported that she has had no thoughts like that since that time and was not having any thoughts at this time.  MOB reported that she was feeling better and knew she would be "alright" once she got home with her baby and could begin on a schedule.  MOB reported that the FOB is very helpful and has already been able to show his support as needed stating "when I need to rest he takes the baby so I can sleep".  MOB reported that she is ready to go home and be back in her own "king size bed".  MOB reported that she had been aware of her emotions going up and down stating she was used to the "baby blues".    LCSW provided education regarding Baby Blues vs PMADs. LCSW encouraged MOB to evaluate her mental health throughout the postpartum period with the use of the New Mom Checklist developed by Postpartum Progress and notify a medical professional if symptoms arise. LCSW also provided resources including support groups that that MOB may attend for additional support.    LCSW also spoke with Wes Earl of [place] CPS who stated that MOB was fine to go home and they would follow up with her tomorrow.  LCSW provided MOB with this information.  No barriers to discharge.    

## 2017-05-27 ENCOUNTER — Telehealth: Payer: Self-pay | Admitting: Family Medicine

## 2017-06-02 ENCOUNTER — Telehealth: Payer: Self-pay | Admitting: Family Medicine

## 2017-06-02 NOTE — Telephone Encounter (Signed)
Erika Porter with mom/ baby called in weight for baby but also reported mom had a 14 on the post partum depression scale

## 2017-06-02 NOTE — Telephone Encounter (Signed)
Will forward to MD to inform of this.  Modesta Sammons,CMA  

## 2017-06-02 NOTE — Telephone Encounter (Signed)
Spoke with patient regarding elevated depression scale. States most pertinent stressors are financial burdens and lack of sleep due to baby's sleep patterns. Endorsed good social support at home with baby's father. Denied SI/HI. Encouraged to call for help if needed.   Ellwood DenseAlison Hayde Kilgour, DO PGY-1, [facility] 06/02/2017 3:41 PM

## 2017-06-26 ENCOUNTER — Ambulatory Visit: Payer: Medicaid Other | Admitting: Student

## 2017-08-31 ENCOUNTER — Other Ambulatory Visit: Payer: Self-pay

## 2017-08-31 ENCOUNTER — Inpatient Hospital Stay (HOSPITAL_COMMUNITY)
Admission: AD | Admit: 2017-08-31 | Discharge: 2017-09-01 | Disposition: A | Discharge status: Home / Self Care | Payer: Medicaid Other | Source: Ambulatory Visit | Attending: Obstetrics & Gynecology | Admitting: Obstetrics & Gynecology

## 2017-08-31 ENCOUNTER — Encounter (HOSPITAL_COMMUNITY): Payer: Self-pay | Admitting: *Deleted

## 2017-08-31 DIAGNOSIS — L292 Pruritus vulvae: Secondary | ICD-10-CM | POA: Insufficient documentation

## 2017-08-31 DIAGNOSIS — Z809 Family history of malignant neoplasm, unspecified: Secondary | ICD-10-CM | POA: Diagnosis not present

## 2017-08-31 DIAGNOSIS — Z79899 Other long term (current) drug therapy: Secondary | ICD-10-CM | POA: Diagnosis not present

## 2017-08-31 DIAGNOSIS — R109 Unspecified abdominal pain: Secondary | ICD-10-CM | POA: Insufficient documentation

## 2017-08-31 DIAGNOSIS — Z8249 Family history of ischemic heart disease and other diseases of the circulatory system: Secondary | ICD-10-CM | POA: Diagnosis not present

## 2017-08-31 DIAGNOSIS — Z791 Long term (current) use of non-steroidal anti-inflammatories (NSAID): Secondary | ICD-10-CM | POA: Diagnosis not present

## 2017-08-31 DIAGNOSIS — Z825 Family history of asthma and other chronic lower respiratory diseases: Secondary | ICD-10-CM | POA: Diagnosis not present

## 2017-08-31 DIAGNOSIS — A599 Trichomoniasis, unspecified: Secondary | ICD-10-CM | POA: Diagnosis not present

## 2017-08-31 DIAGNOSIS — F17210 Nicotine dependence, cigarettes, uncomplicated: Secondary | ICD-10-CM | POA: Insufficient documentation

## 2017-08-31 DIAGNOSIS — Z3202 Encounter for pregnancy test, result negative: Secondary | ICD-10-CM | POA: Diagnosis present

## 2017-08-31 LAB — URINALYSIS, ROUTINE W REFLEX MICROSCOPIC
Bilirubin Urine: NEGATIVE
GLUCOSE, UA: NEGATIVE mg/dL
Hgb urine dipstick: NEGATIVE
KETONES UR: NEGATIVE mg/dL
Nitrite: NEGATIVE
PROTEIN: NEGATIVE mg/dL
Specific Gravity, Urine: 1.023 (ref 1.005–1.030)
pH: 6 (ref 5.0–8.0)

## 2017-08-31 LAB — RAPID URINE DRUG SCREEN, HOSP PERFORMED
AMPHETAMINES: NOT DETECTED
BENZODIAZEPINES: NOT DETECTED
Barbiturates: NOT DETECTED
Cocaine: POSITIVE — AB
OPIATES: NOT DETECTED
Tetrahydrocannabinol: POSITIVE — AB

## 2017-08-31 LAB — CBC
HCT: 35.1 % — ABNORMAL LOW (ref 36.0–46.0)
HEMOGLOBIN: 11.3 g/dL — AB (ref 12.0–15.0)
MCH: 26 pg (ref 26.0–34.0)
MCHC: 32.2 g/dL (ref 30.0–36.0)
MCV: 80.7 fL (ref 78.0–100.0)
Platelets: 226 10*3/uL (ref 150–400)
RBC: 4.35 MIL/uL (ref 3.87–5.11)
RDW: 14.3 % (ref 11.5–15.5)
WBC: 4.9 10*3/uL (ref 4.0–10.5)

## 2017-08-31 LAB — POCT PREGNANCY, URINE: PREG TEST UR: NEGATIVE

## 2017-08-31 LAB — WET PREP, GENITAL
Clue Cells Wet Prep HPF POC: NONE SEEN
SPERM: NONE SEEN
YEAST WET PREP: NONE SEEN

## 2017-08-31 LAB — HCG, QUANTITATIVE, PREGNANCY: hCG, Beta Chain, Quant, S: 1 m[IU]/mL (ref <5)

## 2017-08-31 NOTE — MAU Note (Addendum)
Pt presents to MAU c/o a possible miscarriage. Pt states on Feb 27th 2019 she has a + home UTP. She then bled for weeks and proceeded to have a negative test following the bleeding. Pt states she was not evaluated while pregnant. Pt also reports lower abdominal cramping and vaginal irritation. Pt states she has not had a period since before her baby was born on May 16 2017. Last intercourse Feb 14th 2019.

## 2017-09-01 DIAGNOSIS — R109 Unspecified abdominal pain: Secondary | ICD-10-CM | POA: Diagnosis not present

## 2017-09-01 DIAGNOSIS — A599 Trichomoniasis, unspecified: Secondary | ICD-10-CM

## 2017-09-01 LAB — GC/CHLAMYDIA PROBE AMP (~~LOC~~) NOT AT ARMC
CHLAMYDIA, DNA PROBE: NEGATIVE
Neisseria Gonorrhea: NEGATIVE

## 2017-09-01 MED ORDER — METRONIDAZOLE 500 MG PO TABS
2000.0000 mg | ORAL_TABLET | Freq: Once | ORAL | Status: AC
  Administered 2017-09-01: 2000 mg via ORAL
  Filled 2017-09-01: qty 4

## 2017-09-01 NOTE — Discharge Instructions (Signed)
Trichomoniasis °Trichomoniasis is an STI (sexually transmitted infection) that can affect both women and men. In women, the outer area of the female genitalia (vulva) and the vagina are affected. In men, the penis is mainly affected, but the prostate and other reproductive organs can also be involved. This condition can be treated with medicine. It often has no symptoms (is asymptomatic), especially in men. °What are the causes? °This condition is caused by an organism called Trichomonas vaginalis. Trichomoniasis most often spreads from person to person (is contagious) through sexual contact. °What increases the risk? °The following factors may make you more likely to develop this condition: °· Having unprotected sexual intercourse. °· Having sexual intercourse with a partner who has trichomoniasis. °· Having multiple sexual partners. °· Having had previous trichomoniasis infections or other STIs. ° °What are the signs or symptoms? °In women, symptoms of trichomoniasis include: °· Abnormal vaginal discharge that is clear, white, gray, or yellow-green and foamy and has an unusual "fishy" odor. °· Itching and irritation of the vagina and vulva. °· Burning or pain during urination or sexual intercourse. °· Genital redness and swelling. ° °In men, symptoms of trichomoniasis include: °· Penile discharge that may be foamy or contain pus. °· Pain in the penis. This may happen only when urinating. °· Itching or irritation inside the penis. °· Burning after urination or ejaculation. ° °How is this diagnosed? °In women, this condition may be found during a routine Pap test or physical exam. It may be found in men during a routine physical exam. Your health care provider may perform tests to help diagnose this infection, such as: °· Urine tests (men and women). °· The following in women: °? Testing the pH of the vagina. °? A vaginal swab test that checks for the Trichomonas vaginalis organism. °? Testing vaginal  secretions. ° °Your health care provider may test you for other STIs, including HIV (human immunodeficiency virus). °How is this treated? °This condition is treated with medicine taken by mouth (orally), such as metronidazole or tinidazole to fight the infection. Your sexual partner(s) may also need to be tested and treated. °· If you are a woman and you plan to become pregnant or think you may be pregnant, tell your health care provider right away. Some medicines that are used to treat the infection should not be taken during pregnancy. ° °Your health care provider may recommend over-the-counter medicines or creams to help relieve itching or irritation. You may be tested for infection again 3 months after treatment. °Follow these instructions at home: °· Take and use over-the-counter and prescription medicines, including creams, only as told by your health care provider. °· Do not have sexual intercourse until one week after you finish your medicine, or until your health care provider approves. Ask your health care provider when you may resume sexual intercourse. °· (Women) Do not douche or wear tampons while you have the infection. °· Discuss your infection with your sexual partner(s). Make sure that your partner gets tested and treated, if necessary. °· Keep all follow-up visits as told by your health care provider. This is important. °How is this prevented? °· Use condoms every time you have sex. Using condoms correctly and consistently can help protect against STIs. °· Avoid having multiple sexual partners. °· Talk with your sexual partner about any symptoms that either of you may have, as well as any history of STIs. °· Get tested for STIs and STDs (sexually transmitted diseases) before you have sex. Ask your partner   to do the same.  Do not have sexual contact if you have symptoms of trichomoniasis or another STI. Contact a health care provider if:  You still have symptoms after you finish your  medicine.  You develop pain in your abdomen.  You have pain when you urinate.  You have bleeding after sexual intercourse.  You develop a rash.  You feel nauseous or you vomit.  You plan to become pregnant or think you may be pregnant. Summary  Trichomoniasis is an STI (sexually transmitted infection) that can affect both women and men.  This condition often has no symptoms (is asymptomatic), especially in men.  You should not have sexual intercourse until one week after you finish your medicine, or until your health care provider approves. Ask your health care provider when you may resume sexual intercourse.  Discuss your infection with your sexual partner. Make sure that your partner gets tested and treated, if necessary. This information is not intended to replace advice given to you by your health care provider. Make sure you discuss any questions you have with your health care provider. Document Released: 10/30/2000 Document Revised: 03/29/2016 Document Reviewed: 03/29/2016 Elsevier Interactive Patient Education  2017 [employer]  In late 2019, the [facility] will be moving to the [facility]. At that time, the MAU (Maternity Admissions Unit), where you are being seen today, will no longer take care of non-pregnant patients. We strongly encourage you to find a doctor's office before that time, so that you can be seen with any GYN concerns, like vaginal discharge, urinary tract infection, etc.. in a timely manner.  In order to make an office visit more convenient, the Center for [facility] at [facility] will be offering evening hours with same-day appointments, walk-in appointments and scheduled appointments available during this time.  Center for [facility] Hours: Monday - 8am - 7:30 pm with walk-in between 4pm- 7:30 pm Tuesday - 8 am - 5 pm (starting 08/19/17 we will be open late and accepting walk-ins from 4pm -  7:30pm) Wednesday - 8 am - 5 pm (starting 11/19/17 we will be open late and accepting walk-ins from 4pm - 7:30pm) Thursday 8 am - 5 pm (starting 02/19/18 we will be open late and accepting walk-ins from 4pm - 7:30pm) Friday 8 am - 5 pm  For an appointment please call the Center for [facility] at [phone]  For urgent needs, [facility] is also available for management of urgent GYN complaints such as vaginal discharge or urinary tract infections.

## 2017-09-01 NOTE — MAU Provider Note (Signed)
History     CSN: [id number]  Arrival date and time: 08/31/17 2231   First Provider Initiated Contact with Patient 09/01/17 0011     Chief Complaint  Patient presents with  . Abdominal Pain  . Vaginal Itching   HPI Erika Porter is a 32 y.o. [phone] non pregnant female who presents stating she thinks she might have had a miscarriage. She states she thinks she had a positive pregnancy test a "few months ago" but a negative one this month. She reports 6 weeks of irregular bleeding. She also reports vaginal irritation.   OB History    Gravida  6   Para  5   Term  5   Preterm  0   AB  1   Living  5     SAB  1   TAB  0   Ectopic  0   Multiple  0   Live Births  5           Past Medical History:  Diagnosis Date  . Anemia   . BV (bacterial vaginosis)   . Chlamydia   . Headache(784.0)   . Infection    uti  . Trichomonas     Past Surgical History:  Procedure Laterality Date  . WISDOM TOOTH EXTRACTION      Family History  Problem Relation Age of Onset  . Hypertension Mother   . Anxiety disorder Mother   . Asthma Sister   . Asthma Daughter   . Asthma Son   . Cancer Maternal Aunt   . Heart disease Maternal Grandfather     Social History   Tobacco Use  . Smoking status: Current Some Day Smoker    Packs/day: 0.25    Years: 4.00    Pack years: 1.00    Types: Cigarettes  . Smokeless tobacco: Never Used  . Tobacco comment: quit with preg  Substance Use Topics  . Alcohol use: Yes    Alcohol/week: 0.0 oz    Frequency: Never    Comment: not while preg  . Drug use: Yes    Types: Marijuana, Cocaine    Comment: Last cocaine use 12/24, last marijuana today 12/27    Allergies: No Known Allergies  Medications Prior to Admission  Medication Sig Dispense Refill Last Dose  . Prenatal MV & Min w/FA-DHA (PRENATAL ADULT GUMMY/DHA/FA) 0.4-25 MG CHEW Chew 1 tablet by mouth daily. (Patient taking differently: Chew 3 tablets by mouth daily. ) 30 tablet 9  Past Week at Unknown time  . acetaminophen (TYLENOL) 500 MG tablet Take 2 tablets (1,000 mg total) by mouth every 6 (six) hours as needed for headache. (Patient taking differently: Take 1,000 mg by mouth every 6 (six) hours as needed for moderate pain or headache. ) 30 tablet 0 Past Week at Unknown time  . ibuprofen (ADVIL,MOTRIN) 600 MG tablet Take 1 tablet (600 mg total) by mouth every 6 (six) hours. 30 tablet 0     Review of Systems  Constitutional: Negative.  Negative for fatigue and fever.  HENT: Negative.   Respiratory: Negative.  Negative for shortness of breath.   Cardiovascular: Negative.  Negative for chest pain.  Gastrointestinal: Negative.  Negative for abdominal pain, constipation, diarrhea, nausea and vomiting.  Genitourinary: Positive for vaginal discharge. Negative for dysuria.  Neurological: Negative.  Negative for dizziness and headaches.   Physical Exam   Blood pressure 116/75, pulse 75, temperature 98.6 F (37 C), temperature source Oral, resp. rate 19, height 5\' 7"  (1.702  m), weight 142 lb 0.6 oz (64.4 kg), unknown if currently breastfeeding.  Physical Exam  Nursing note and vitals reviewed. Constitutional: She is oriented to person, place, and time. She appears well-developed and well-nourished. No distress.  HENT:  Head: Normocephalic.  Eyes: Pupils are equal, round, and reactive to light.  Cardiovascular: Normal rate, regular rhythm and normal heart sounds.  Respiratory: Effort normal and breath sounds normal. No respiratory distress.  GI: Soft. Bowel sounds are normal. She exhibits no distension. There is no tenderness.  Genitourinary: Vaginal discharge found.  Neurological: She is alert and oriented to person, place, and time.  Skin: Skin is warm and dry.  Psychiatric: She has a normal mood and affect. Her behavior is normal. Judgment and thought content normal.    MAU Course  Procedures Results for orders placed or performed during the hospital encounter  of 08/31/17 (from the past 24 hour(s))  Urinalysis, Routine w reflex microscopic     Status: Abnormal   Collection Time: 08/31/17 10:51 PM  Result Value Ref Range   Color, Urine YELLOW YELLOW   APPearance CLEAR CLEAR   Specific Gravity, Urine 1.023 1.005 - 1.030   pH 6.0 5.0 - 8.0   Glucose, UA NEGATIVE NEGATIVE mg/dL   Hgb urine dipstick NEGATIVE NEGATIVE   Bilirubin Urine NEGATIVE NEGATIVE   Ketones, ur NEGATIVE NEGATIVE mg/dL   Protein, ur NEGATIVE NEGATIVE mg/dL   Nitrite NEGATIVE NEGATIVE   Leukocytes, UA SMALL (A) NEGATIVE   RBC / HPF 0-5 0 - 5 RBC/hpf   WBC, UA 0-5 0 - 5 WBC/hpf   Bacteria, UA RARE (A) NONE SEEN   Squamous Epithelial / LPF 0-5 (A) NONE SEEN  Urine rapid drug screen (hosp performed)     Status: Abnormal   Collection Time: 08/31/17 10:51 PM  Result Value Ref Range   Opiates NONE DETECTED NONE DETECTED   Cocaine POSITIVE (A) NONE DETECTED   Benzodiazepines NONE DETECTED NONE DETECTED   Amphetamines NONE DETECTED NONE DETECTED   Tetrahydrocannabinol POSITIVE (A) NONE DETECTED   Barbiturates NONE DETECTED NONE DETECTED  Pregnancy, urine POC     Status: None   Collection Time: 08/31/17 11:01 PM  Result Value Ref Range   Preg Test, Ur NEGATIVE NEGATIVE  CBC     Status: Abnormal   Collection Time: 08/31/17 11:20 PM  Result Value Ref Range   WBC 4.9 4.0 - 10.5 K/uL   RBC 4.35 3.87 - 5.11 MIL/uL   Hemoglobin 11.3 (L) 12.0 - 15.0 g/dL   HCT 16.1 (L) 09.6 - 04.5 %   MCV 80.7 78.0 - 100.0 fL   MCH 26.0 26.0 - 34.0 pg   MCHC 32.2 30.0 - 36.0 g/dL   RDW 40.9 81.1 - 91.4 %   Platelets 226 150 - 400 K/uL  hCG, quantitative, pregnancy     Status: None   Collection Time: 08/31/17 11:20 PM  Result Value Ref Range   hCG, Beta Chain, Quant, S 1 <5 mIU/mL  Wet prep, genital     Status: Abnormal   Collection Time: 08/31/17 11:41 PM  Result Value Ref Range   Yeast Wet Prep HPF POC NONE SEEN NONE SEEN   Trich, Wet Prep PRESENT (A) NONE SEEN   Clue Cells Wet Prep HPF  POC NONE SEEN NONE SEEN   WBC, Wet Prep HPF POC MANY (A) NONE SEEN   Sperm NONE SEEN    MDM UA, UPT UDS CBC, HCG Wet prep and gc/chlamydia Metronidazole 2000mg  PO  Assessment  and Plan   1. Pregnancy examination or test, negative result   2. Trichomoniasis    -Discharge home in stable condition -Safe sexual precautions discussed -Patient advised to follow-up with [facility] as needed for routine gyn care -Patient may return to MAU as needed or if her condition were to change or worsen   Rolm BookbinderCaroline M Peytin Dechert CNM 09/01/2017, 12:16 AM

## 2017-12-08 ENCOUNTER — Encounter: Payer: Self-pay | Admitting: Family Medicine

## 2017-12-08 ENCOUNTER — Other Ambulatory Visit (HOSPITAL_COMMUNITY)
Admission: RE | Admit: 2017-12-08 | Discharge: 2017-12-08 | Disposition: A | Discharge status: Home / Self Care | Payer: Medicaid Other | Source: Ambulatory Visit | Attending: Family Medicine | Admitting: Family Medicine

## 2017-12-08 ENCOUNTER — Other Ambulatory Visit: Payer: Self-pay

## 2017-12-08 ENCOUNTER — Ambulatory Visit: Payer: Medicaid Other | Admitting: Family Medicine

## 2017-12-08 VITALS — BP 120/62 | HR 87 | Temp 98.8°F | Wt 141.0 lb

## 2017-12-08 DIAGNOSIS — Z113 Encounter for screening for infections with a predominantly sexual mode of transmission: Secondary | ICD-10-CM

## 2017-12-08 DIAGNOSIS — Z3A01 Less than 8 weeks gestation of pregnancy: Secondary | ICD-10-CM

## 2017-12-08 LAB — POCT URINE PREGNANCY: PREG TEST UR: POSITIVE — AB

## 2017-12-08 LAB — POCT WET PREP (WET MOUNT)
CLUE CELLS WET PREP WHIFF POC: POSITIVE
Trichomonas Wet Prep HPF POC: ABSENT

## 2017-12-08 MED ORDER — PRENATAL ADULT GUMMY/DHA/FA 0.4-25 MG PO CHEW: 1.0000 | CHEWABLE_TABLET | Freq: Every day | ORAL | 9 refills | Status: DC

## 2017-12-08 NOTE — Progress Notes (Signed)
    Subjective:  Erika Porter is a 32 y.o. female who [facility] for pregnancy and STD test.  HPI:  Patient is here today because she missed her period and thinks that she is pregnant.  She is fairly sure of her LMP which was around June 7 because it was at a time where her friend had a birthday and she was getting her food stamps.  She is certain of her LMP within 1 to 2 days. This pregnancy is not entirely planned but desired. She also wants an STD check as she has been positive for trichomonas in the past and her partner was untreated. Does not have any vaginal discharge or itching.  No bleeding.   ROS: Per HPI  Objective:  Physical Exam: BP 120/62   Pulse 87   Temp 98.8 F (37.1 C) (Oral)   Wt 141 lb (64 kg)   LMP 10/24/2017 (Within Days)   SpO2 99%   BMI 22.08 kg/m   Gen: NAD, resting comfortably Pelvic exam: normal external genitalia, vulva, vagina, cervix, uterus and adnexa. Skin: warm, dry Neuro: grossly normal, moves all extremities Psych: Normal affect and thought content  Results for orders placed or performed in visit on 12/08/17 (from the past 72 hour(s))  POCT urine pregnancy     Status: Abnormal   Collection Time: 12/08/17  4:12 PM  Result Value Ref Range   Preg Test, Ur Positive (A) Negative  POCT Wet Prep Mellody Drown(Wet [place])     Status: Abnormal   Collection Time: 12/08/17  4:39 PM  Result Value Ref Range   Source Wet Prep POC VAG    WBC, Wet Prep HPF POC NONE    Bacteria Wet Prep HPF POC Moderate (A) Few   Clue Cells Wet Prep HPF POC Few (A) None   Clue Cells Wet Prep Whiff POC Positive Whiff    Yeast Wet Prep HPF POC None None   KOH Wet Prep POC None None   Trichomonas Wet Prep HPF POC Absent Absent     Assessment/Plan:  Less than [redacted] weeks gestation of pregnancy Patient is about 6 weeks and 3 days by her LMP.  She was counseled on safe medications during pregnancy and advised to start a prenatal vitamin.  She does not need early  ultrasound for dating.  [facility] and advised to schedule her initial prenatal visit in the next couple of weeks.   Leland HerElsia J Yoo, DO PGY-3, [facility] 12/08/2017 4:22 PM

## 2017-12-08 NOTE — Patient Instructions (Addendum)
Start a prenatal vitamin.  Make an appointment for first prenatal visit as soon as you can.   We are checking some labs today. If results require attention, either myself or my nurse will get in touch with you. If everything is normal, you will get a letter in the mail or a message in My Chart. Please give [place] a call if you do not hear from [place] after 2 weeks.   Eating Plan for Pregnant Women While you are pregnant, your body will require additional nutrition to help support your growing baby. It is recommended that you consume:  150 additional calories each day during your first trimester.  300 additional calories each day during your second trimester.  300 additional calories each day during your third trimester.  Eating a healthy, well-balanced diet is very important for your health and for your baby's health. You also have a higher need for some vitamins and minerals, such as folic acid, calcium, iron, and vitamin D. What do I need to know about eating during pregnancy?  Do not try to lose weight or go on a diet during pregnancy.  Choose healthy, nutritious foods. Choose  of a sandwich with a glass of milk instead of a candy bar or a high-calorie sugar-sweetened beverage.  Limit your overall intake of foods that have "empty calories." These are foods that have little nutritional value, such as sweets, desserts, candies, sugar-sweetened beverages, and fried foods.  Eat a variety of foods, especially fruits and vegetables.  Take a prenatal vitamin to help meet the additional needs during pregnancy, specifically for folic acid, iron, calcium, and vitamin D.  Remember to stay active. Ask your health care provider for exercise recommendations that are specific to you.  Practice good food safety and cleanliness, such as washing your hands before you eat and after you prepare raw meat. This helps to prevent foodborne illnesses, such as listeriosis, that can be very dangerous for your baby.  Ask your health care provider for more information about listeriosis. What does 150 extra calories look like? Healthy options for an additional 150 calories each day could be any of the following:  Plain low-fat yogurt (6-8 oz) with  cup of berries.  1 apple with 2 teaspoons of peanut butter.  Cut-up vegetables with  cup of hummus.  Low-fat chocolate milk (8 oz or 1 cup).  1 string cheese with 1 medium orange.   of a peanut butter and jelly sandwich on whole-wheat bread (1 tsp of peanut butter).  For 300 calories, you could eat two of those healthy options each day. What is a healthy amount of weight to gain? The recommended amount of weight for you to gain is based on your pre-pregnancy BMI. If your pre-pregnancy BMI was:  Less than 18 (underweight), you should gain 28-40 lb.  18-24.9 (normal), you should gain 25-35 lb.  25-29.9 (overweight), you should gain 15-25 lb.  Greater than 30 (obese), you should gain 11-20 lb.  What if I am having twins or multiples? Generally, pregnant women who will be having twins or multiples may need to increase their daily calories by 300-600 calories each day. The recommended range for total weight gain is 25-54 lb, depending on your pre-pregnancy BMI. Talk with your health care provider for specific guidance about additional nutritional needs, weight gain, and exercise during your pregnancy. What foods can I eat? Grains Any grains. Try to choose whole grains, such as whole-wheat bread, oatmeal, or brown rice. Vegetables Any vegetables. Try  to eat a variety of colors and types of vegetables to get a full range of vitamins and minerals. Remember to wash your vegetables well before eating. Fruits Any fruits. Try to eat a variety of colors and types of fruit to get a full range of vitamins and minerals. Remember to wash your fruits well before eating. Meats and Other Protein Sources Lean meats, including chicken, [place], fish, and lean cuts of  beef, veal, or pork. Make sure that all meats are cooked to "well done." Tofu. Tempeh. Beans. Eggs. Peanut butter and other nut butters. Seafood, such as shrimp, crab, and lobster. If you choose fish, select types that are higher in omega-3 fatty acids, including salmon, herring, mussels, trout, sardines, and pollock. Make sure that all meats are cooked to food-safe temperatures. Dairy Pasteurized milk and milk alternatives. Pasteurized yogurt and pasteurized cheese. Cottage cheese. Sour cream. Beverages Water. Juices that contain 100% fruit juice or vegetable juice. Caffeine-free teas and decaffeinated coffee. Drinks that contain caffeine are okay to drink, but it is better to avoid caffeine. Keep your total caffeine intake to less than 200 mg each day (12 oz of coffee, tea, or soda) or as directed by your health care provider. Condiments Any pasteurized condiments. Sweets and Desserts Any sweets and desserts. Fats and Oils Any fats and oils. The items listed above may not be a complete list of recommended foods or beverages. Contact your dietitian for more options. What foods are not recommended? Vegetables Unpasteurized (raw) vegetable juices. Fruits Unpasteurized (raw) fruit juices. Meats and Other Protein Sources Cured meats that have nitrates, such as bacon, salami, and hotdogs. Luncheon meats, bologna, or other deli meats (unless they are reheated until they are steaming hot). Refrigerated pate, meat spreads from a meat counter, smoked seafood that is found in the refrigerated section of a store. Raw fish, such as sushi or sashimi. High mercury content fish, such as tilefish, shark, swordfish, and king mackerel. Raw meats, such as tuna or beef tartare. Undercooked meats and poultry. Make sure that all meats are cooked to food-safe temperatures. Dairy Unpasteurized (raw) milk and any foods that have raw milk in them. Soft cheeses, such as feta, queso blanco, queso fresco, Brie, Camembert  cheeses, blue-veined cheeses, and Panela cheese (unless it is made with pasteurized milk, which must be stated on the label). Beverages Alcohol. Sugar-sweetened beverages, such as sodas, teas, or energy drinks. Condiments Homemade fermented foods and drinks, such as pickles, sauerkraut, or kombucha drinks. (Store-bought pasteurized versions of these are okay.) Other Salads that are made in the store, such as ham salad, chicken salad, egg salad, tuna salad, and seafood salad. The items listed above may not be a complete list of foods and beverages to avoid. Contact your dietitian for more information. This information is not intended to replace advice given to you by your health care provider. Make sure you discuss any questions you have with your health care provider. Document Released: 02/18/2014 Document Revised: 10/12/2015 Document Reviewed: 10/19/2013 Elsevier Interactive Patient Education  2018 [employer].    Safe Medications in Pregnancy   Acne:  Benzoyl Peroxide  Salicylic Acid   Backache/Headache:  Tylenol: 2 regular strength every 4 hours OR        2 Extra strength every 6 hours   Colds/Coughs/Allergies:  Benadryl (alcohol free) 25 mg every 6 hours as needed  Breath right strips  Claritin  Cepacol throat lozenges  Chloraseptic throat spray  Cold-Eeze- up to three times per day  Cough  drops, alcohol free  Flonase (by prescription only)  Guaifenesin  Mucinex  Robitussin DM (plain only, alcohol free)  Saline nasal spray/drops  Sudafed (pseudoephedrine) & Actifed * use only after [redacted] weeks gestation and if you do not have high blood pressure  Tylenol  Vicks Vaporub  Zinc lozenges  Zyrtec   Constipation:  Colace  Ducolax suppositories  Fleet enema  Glycerin suppositories  Metamucil  Milk of magnesia  Miralax  Senokot  Smooth move tea   Diarrhea:  Kaopectate  Imodium A-D   *NO pepto Bismol   Hemorrhoids:  Anusol  Anusol HC  Preparation H   Tucks   Indigestion:  Tums  Maalox  Mylanta  Zantac  Pepcid   Insomnia:  Benadryl (alcohol free) 25mg  every 6 hours as needed  Tylenol PM  Unisom, no Gelcaps   Leg Cramps:  Tums  MagGel   Nausea/Vomiting:  Bonine  Dramamine  Emetrol  Ginger extract  Sea bands  Meclizine  Nausea medication to take during pregnancy:  Unisom (doxylamine succinate 25 mg tablets) Take one tablet daily at bedtime. If symptoms are not adequately controlled, the dose can be increased to a maximum recommended dose of two tablets daily (1/2 tablet in the morning, 1/2 tablet mid-afternoon and one at bedtime).  Vitamin B6 100mg  tablets. Take one tablet twice a day (up to 200 mg per day).   Skin Rashes:  Aveeno products  Benadryl cream or 25mg  every 6 hours as needed  Calamine Lotion  1% cortisone cream   Yeast infection:  Gyne-lotrimin 7  Monistat 7    **If taking multiple medications, please check labels to avoid duplicating the same active ingredients  **take medication as directed on the label  ** Do not exceed 4000 mg of tylenol in 24 hours  **Do not take medications that contain aspirin or ibuprofen

## 2017-12-08 NOTE — Assessment & Plan Note (Signed)
Patient is about 6 weeks and 3 days by her LMP.  She was counseled on safe medications during pregnancy and advised to start a prenatal vitamin.  She does not need early ultrasound for dating.  [facility] and advised to schedule her initial prenatal visit in the next couple of weeks.

## 2017-12-12 LAB — CERVICOVAGINAL ANCILLARY ONLY
CHLAMYDIA, DNA PROBE: NEGATIVE
Neisseria Gonorrhea: NEGATIVE

## 2017-12-15 ENCOUNTER — Encounter: Payer: Self-pay | Admitting: Family Medicine

## 2018-01-26 ENCOUNTER — Encounter: Payer: Medicaid Other | Admitting: Family Medicine

## 2018-02-11 ENCOUNTER — Other Ambulatory Visit (HOSPITAL_COMMUNITY)
Admission: RE | Admit: 2018-02-11 | Discharge: 2018-02-11 | Disposition: A | Discharge status: Home / Self Care | Payer: Medicaid Other | Source: Ambulatory Visit | Attending: Family Medicine | Admitting: Family Medicine

## 2018-02-11 ENCOUNTER — Other Ambulatory Visit: Payer: Self-pay | Admitting: Family Medicine

## 2018-02-11 ENCOUNTER — Telehealth: Payer: Self-pay | Admitting: Family Medicine

## 2018-02-11 ENCOUNTER — Encounter: Payer: Self-pay | Admitting: Family Medicine

## 2018-02-11 ENCOUNTER — Ambulatory Visit (INDEPENDENT_AMBULATORY_CARE_PROVIDER_SITE_OTHER): Payer: Medicaid Other | Admitting: Family Medicine

## 2018-02-11 VITALS — BP 102/80 | HR 96 | Temp 98.4°F | Wt 141.4 lb

## 2018-02-11 DIAGNOSIS — O0941 Supervision of pregnancy with grand multiparity, first trimester: Secondary | ICD-10-CM

## 2018-02-11 DIAGNOSIS — N898 Other specified noninflammatory disorders of vagina: Secondary | ICD-10-CM

## 2018-02-11 DIAGNOSIS — O99281 Endocrine, nutritional and metabolic diseases complicating pregnancy, first trimester: Secondary | ICD-10-CM

## 2018-02-11 DIAGNOSIS — O09291 Supervision of pregnancy with other poor reproductive or obstetric history, first trimester: Secondary | ICD-10-CM

## 2018-02-11 DIAGNOSIS — Z3491 Encounter for supervision of normal pregnancy, unspecified, first trimester: Secondary | ICD-10-CM

## 2018-02-11 DIAGNOSIS — E0590 Thyrotoxicosis, unspecified without thyrotoxic crisis or storm: Secondary | ICD-10-CM

## 2018-02-11 LAB — POCT WET PREP (WET MOUNT)
Clue Cells Wet Prep Whiff POC: POSITIVE
Trichomonas Wet Prep HPF POC: ABSENT

## 2018-02-11 MED ORDER — METRONIDAZOLE 500 MG PO TABS: 500.0000 mg | ORAL_TABLET | Freq: Two times a day (BID) | ORAL | 0 refills | Status: AC

## 2018-02-11 NOTE — Progress Notes (Addendum)
   Subjective:   Patient ID: Erika Porter    DOB: [birth year], 32 y.o. female   MRN: [id number]  CC: vaginal irritation   HPI: Erika Porter is a 32 y.o. female who presents to clinic today for the following issue.  Vaginal irritation Symptoms present x 2 weeks, associated with itching, no foul odor.  She reports a heavy thick white discharge.  Denies burning with urination.  She has been urinating more frequently as she is currently 4 months pregnant.  Denies vaginal bleeding, abdominal pain/cramping.  She has had one female sexual partner for the last 2 years.  She desires STD testing today.  Does not desire HIV, RPR.  No fever, chills, body aches.   ROS: See HPI for pertinent ROS.  Social: Patient is a current some day smoker Medications reviewed. Objective:   BP 102/80   Pulse 96   Temp 98.4 F (36.9 C)   Wt 141 lb 6.4 oz (64.1 kg)   LMP 10/24/2017 (Within Days)   BMI 22.15 kg/m  Vitals and nursing note reviewed.  General: 32 year old female, NAD CV: RRR no MRG  Lungs: CTAB, normal effort  Abdomen: soft, NTND, no masses or organomegaly, +bs  Pelvic exam: VULVA: normal appearing vulva with no masses, tenderness or lesions, VAGINA: normal appearing vagina with normal color, no lesions, clear milky discharge present in vault.  CERVIX: normal appearing cervix without discharge or lesions, UTERUS: uterus is gravid, shape, consistency and nontender. No CMT on bimanual exam.   Skin: warm, dry, no rash Extremities: warm and well perfused, normal tone Assessment & Plan:   Supervision of pregnancy with grand multiparity Has not established care or had an initial OB prenatal visit yet.  Discussed with patient she will need follow up within a week for this as she is already 4 months pregnant.  High risk with history of substance abuse and cocaine use during previous pregnancy.  She expresses good understanding and will schedule this.   Vaginal irritation No known exposure to any STDs  however she desires testing today.  Exam is notable for moderate amount of milky white discharge in vaginal vault. -Wet prep -GC chlamydia -RPR, HIV declined -Safe sex counseling provided  Orders Placed This Encounter  Procedures  . POCT Wet Prep [facility])   Freddrick March, MD [facility] PGY-3

## 2018-02-11 NOTE — Assessment & Plan Note (Addendum)
Has not established care or had an initial OB prenatal visit yet.  Discussed with patient she will need follow up within a week for this as she is already 4 months pregnant.  High risk with history of substance abuse and cocaine use during previous pregnancy.  She expresses good understanding and will schedule this.

## 2018-02-11 NOTE — Patient Instructions (Signed)
It was nice meeting you today.  You were seen in clinic for vaginal irritation and we did some testing to check for infection.  I will call you once I have the results of these tests.  In the meantime, you will need to make a initial prenatal OB visit with a provider within the next week or so to be seen.  Please call Annice Pih on the business card provided to schedule this appointment.  Please call clinic if you have any questions.  You may call clinic if you have any questions.  Freddrick March MD

## 2018-02-11 NOTE — Telephone Encounter (Addendum)
HIPAA compliant call back message left.    Patient seen by the resident for vaginitis today and it seems that her pregnancy was not addressed. Patient was not precepted. However, I reviewed her record.   Findings: 1. GA today based on LMP on 11/23/17 per note from July  Is 11 weeks 3 days. 2. No Prenatal care has been established it seems. 3. High risk pregnancy for the following reasons ( Hx of Hyperthyroidism and was on Methimazole and PTU during previous pregnancy; also she has hx of polysubstance abuse with most recent utox done in April positive for Cocaine and Marijuana). 4. BV positive during today's visit.   I was unable to reach her via phone call. Call back message left.  When she calls back, please do the following:  1. Help her make OB clinic appointment for Oct 10th. Okay to do so per Dr. Manson Passey. 2. Help her make a laboratory appointment for prenatal lab as soon as possible and before her OB appointment on the 10th. 3. Go ahead and schedule referral to high risk clinic. 4. Recheck TSH 5.  Dr. Nelson Chimes will contact her with +BV result and treat appropriately. 6. Will need genetic screening as soon as her next visit.   I already placed prenatal lab for the future and placed referral to [facility].

## 2018-02-11 NOTE — Progress Notes (Signed)
Patient ID: Erika SmilingJessica M Porter, female   DOB: Oct 06, 1985, 32 y.o.   MRN: [id number]   Patient seen by the resident for vaginitis today and it seems that her pregnancy was not addressed. Patient was not precepted. However, I reviewed her record.   Findings: 1. GA today based on LMP on 11/23/17 per note from July  Is 11 weeks 3 days. 2. No Prenatal care has been established it seems. 3. High risk pregnancy for the following reasons ( Hx of Hyperthyroidism and was on Methimazole and PTU during previous pregnancy; also she has hx of polysubstance abuse with most recent utox done in April positive for Cocaine and Marijuana). 4. BV positive during today's visit.   I was unable to reach her via phone call. Call back message left.  When she calls back, please do the following:  1. Help her make OB clinic appointment for Oct 10th. Okay to do so per Dr. Manson PasseyBrown. 2. Help her make a laboratory appointment for prenatal lab as soon as possible and before her OB appointment on the 10th. 3. Go ahead and schedule referral to high risk clinic. 4. Recheck TSH 5.  Dr. Nelson Porter will contact her with this result and treat appropriately.   I already placed prenatal lab for the future and placed [facility].

## 2018-02-11 NOTE — Telephone Encounter (Signed)
Called patient to discuss +BV and have sent flagyl to her pharmacy.  She will pick this up today and take it as prescribed.  Have also scheduled her a lab visit for tomorrow afternoon (02/12/18) at 3:15 PM.  She states she is able to come in at this time to have her prenatal bloodwork done.   I was unable to schedule a 60 min prenatal visit as the system does not allow me to override this.  Again, I have reiterated to the patient to call Dereck Ligas to have this appointment scheduled within the [facility] clinic visit on October 10th.   Gilberto Better has also sent Annice Pih a staff message informing her of this appointment that needs to be made.  She provided patient with Jackie's name and number to call and patient will do so.

## 2018-02-12 ENCOUNTER — Other Ambulatory Visit: Payer: Self-pay

## 2018-02-12 LAB — CERVICOVAGINAL ANCILLARY ONLY
CHLAMYDIA, DNA PROBE: NEGATIVE
Neisseria Gonorrhea: NEGATIVE

## 2018-02-17 ENCOUNTER — Telehealth: Payer: Self-pay | Admitting: Family Medicine

## 2018-02-17 NOTE — Telephone Encounter (Signed)
Called and left a detailed message about appt.

## 2018-02-26 ENCOUNTER — Ambulatory Visit (INDEPENDENT_AMBULATORY_CARE_PROVIDER_SITE_OTHER): Payer: Self-pay | Admitting: Family Medicine

## 2018-02-26 ENCOUNTER — Other Ambulatory Visit (HOSPITAL_COMMUNITY)
Admission: RE | Admit: 2018-02-26 | Discharge: 2018-02-26 | Disposition: A | Discharge status: Home / Self Care | Payer: Medicaid Other | Source: Ambulatory Visit | Attending: Family Medicine | Admitting: Family Medicine

## 2018-02-26 VITALS — BP 102/70 | HR 80 | Temp 98.3°F | Wt 141.8 lb

## 2018-02-26 DIAGNOSIS — R87618 Other abnormal cytological findings on specimens from cervix uteri: Secondary | ICD-10-CM | POA: Insufficient documentation

## 2018-02-26 DIAGNOSIS — R7989 Other specified abnormal findings of blood chemistry: Secondary | ICD-10-CM

## 2018-02-26 DIAGNOSIS — O0942 Supervision of pregnancy with grand multiparity, second trimester: Secondary | ICD-10-CM | POA: Diagnosis present

## 2018-02-26 DIAGNOSIS — Z3A17 17 weeks gestation of pregnancy: Secondary | ICD-10-CM | POA: Diagnosis not present

## 2018-02-26 DIAGNOSIS — Z3491 Encounter for supervision of normal pregnancy, unspecified, first trimester: Secondary | ICD-10-CM

## 2018-02-26 DIAGNOSIS — R8789 Other abnormal findings in specimens from female genital organs: Secondary | ICD-10-CM

## 2018-02-26 HISTORY — DX: Other specified abnormal findings of blood chemistry: R79.89

## 2018-02-26 LAB — POCT URINALYSIS DIP (MANUAL ENTRY)
Bilirubin, UA: NEGATIVE
Glucose, UA: NEGATIVE mg/dL
Leukocytes, UA: NEGATIVE
Nitrite, UA: NEGATIVE
Protein Ur, POC: NEGATIVE mg/dL
RBC UA: NEGATIVE
SPEC GRAV UA: 1.02 (ref 1.010–1.025)
UROBILINOGEN UA: NEGATIVE U/dL — AB
pH, UA: 7 (ref 5.0–8.0)

## 2018-02-26 LAB — POCT URINALYSIS DIPSTICK

## 2018-02-26 LAB — POCT WET PREP (WET MOUNT)
Clue Cells Wet Prep Whiff POC: NEGATIVE
TRICHOMONAS WET PREP HPF POC: ABSENT

## 2018-02-26 NOTE — Progress Notes (Signed)
we

## 2018-02-26 NOTE — Progress Notes (Signed)
Patient Name: Erika Porter Date of Birth: 01/12/1986 Date of Visit: 02/26/18 PCP: Ellwood Dense, DO  Chief Complaint: initial prenatal visit   Subjective: Erika Porter is a pleasant [phone] at 17 weeks and 6 days dated by approximate last menstrual period.The patient reports overall she feels well.  She denies loss of fluid, vaginal bleeding, contractions.  She reports she is already feeling fetal movement. In terms of her dating the patient reports her periods are relatively regular every 28 to 30 days.  She thinks her last menstrual period was on her friend's birthday.  She reported to Dr. Artist Pais earlier this year that was 7 June but now she thinks it may have been sometime in May. She has net had an ultrasound in this pregnancy. The patient has a history of polysubstance use.  She reports she has not used cocaine since the start of this pregnancy.  She continues to smoke marijuana daily.  She does not use cigarettes.  She denies other substance use or alcohol use. Suppressed TSH  The patient has a history of hyperemesis gravidarum in her G4 pregnancy.  During this time her TSH was suppressed and her T4 and T3 were were normal.  From what I can find she does not have a history of hyperthyroidism.  In our system she has never been prescribed a medication for hyperthyroidism.  This is been documented in her chart previously she denies this history.  She does endorse a history of a lot of vomiting during her one pregnancy. BV The patient has still not completed her treatment course for bacterial vaginosis.  She reports she has 2 more doses left.  ROS Negative for headaches, chest pain, dyspnea, abdominal pain.  She reports overall she is feeling well  I have reviewed the patient's medical, surgical, family, and social history as appropriate.   Pertinent PMH:  Cocaine use Marijuana use disorder Previous history of hyperemesis gravidarum with suppressed TSH which is an appropriate  response HPV positive Pap in the past this was repeated and was within normal limits with negative HPV  Prior Obstetric History:  For term vaginal deliveries 1 early term delivery Spontaneous abortion  Typically denies a history of hypertension in pregnancy, gestational diabetes or postpartum hemorrhage.  She has never had a problem with shoulder dystocias.  All of her infants have been either small for gestational age or appropriate for gestational age.   Complications of Current Pregnancy: Marijuana use disorder History of cocaine use  Vitals:   02/26/18 0949  BP: 102/70  Pulse: 80  Temp: 98.3 F (36.8 C)  SpO2: 100%   Last Weight  Most recent update: 02/26/2018  9:50 AM   Weight  64.3 kg (141 lb 12.8 oz)           Pregravid weight not on file Could not be calculated Filed Weights   02/26/18 0949  Weight: 141 lb 12.8 oz (64.3 kg)    HEENT: Sclera anicteric. Dentition is moderate. Appears well hydrated. Neck: Supple Cardiac: Regular rate and rhythm. Normal S1/S2. No murmurs, rubs, or gallops appreciated. Lungs: Clear bilaterally to ascultation.  Abdomen: Normoactive bowel sounds. No tenderness to deep or light palpation. No rebound or guarding.  Extremities: Warm, well perfused without edema.  Skin: Warm, dry Psych: Pleasant and appropriate  GU exam: Normal-appearing external female genitalia.  Normal-appearing vaginal discharge.  No ulcerations or lesions.  Cervix is normal in appearance as well.  [name] was seen today for routine prenatal visit.  Diagnoses and all orders for this visit:  Supervision of pregnancy with grand multiparity in second trimester -     POCT Wet Prep ([facility]) -     Ferritin -     [place] OP OB Comp + 14 WK; Future -     Cervicovaginal ancillary only -     Hepatitis c antibody (reflex) -     AMB referral to Maternal Fetal Medicine (MFM) -     Obstetric Panel, Including HIV([facility]) -     Culture, OB Urine -     TSH -     POCT  urinalysis dipstick  History of HPV + Pap, normal repeat in 2018.Due in 2022.    Polysubstance use discussed with patient at length recommended she discontinue marijuana use.  Discussed risks to fetus including preterm labor and low birthweight.  There is also uncertainty and a paucity of data around the long-term health complications of this use.  Discussed alternative options including Diclegis just for control of morning nausea and vomiting.   Uncertain Dating as the patient is at uncertain gestational age a quad screen was not ordered today.  We will obtain an ultrasound prior to ordering genetic testing.  The patient does not wish to have genetic testing at this time and it was discussed today   History of abnormal TSH, likely related to her hyperemesis during this pregnancy.  Her TSH has been normal since that time will repeat today.  She has never been on methimazole or PTU during any of her pregnancies.  History of STI repeat in each trimester.    Anticipatory Guidance and Prenatal Education provided on the following topics: - Reasons to present to MAU - Nutrition in pregnancy - Contraception postpartum- Patient may consider a BTL   Terisa Starr, MD  [facility] Medicine Teaching Service

## 2018-02-26 NOTE — Patient Instructions (Addendum)
Please be sure to call Medicaid about your insurance coverage.   Second Trimester of Pregnancy The second trimester is from week 13 through week 28, month 4 through 6. This is often the time in pregnancy that you feel your best. Often times, morning sickness has lessened or quit. You may have more energy, and you may get hungry more often. Your unborn baby (fetus) is growing rapidly. At the end of the sixth month, he or she is about 9 inches long and weighs about 1 pounds. You will likely feel the baby move (quickening) between 18 and 20 weeks of pregnancy. Follow these instructions at home:  Avoid all smoking, herbs, and alcohol. Avoid drugs not approved by your doctor.  Do not use any tobacco products, including cigarettes, chewing tobacco, and electronic cigarettes. If you need help quitting, ask your doctor. You may get counseling or other support to help you quit.  Only take medicine as told by your doctor. Some medicines are safe and some are not during pregnancy.  Exercise only as told by your doctor. Stop exercising if you start having cramps.  Eat regular, healthy meals.  Wear a good support bra if your breasts are tender.  Do not use hot tubs, steam rooms, or saunas.  Wear your seat belt when driving.  Avoid raw meat, uncooked cheese, and liter boxes and soil used by cats.  Take your prenatal vitamins.  Take 1500-2000 milligrams of calcium daily starting at the 20th week of pregnancy until you deliver your baby.  Try taking medicine that helps you poop (stool softener) as needed, and if your doctor approves. Eat more fiber by eating fresh fruit, vegetables, and whole grains. Drink enough fluids to keep your pee (urine) clear or pale yellow.  Take warm water baths (sitz baths) to soothe pain or discomfort caused by hemorrhoids. Use hemorrhoid cream if your doctor approves.  If you have puffy, bulging veins (varicose veins), wear support hose. Raise (elevate) your feet for 15  minutes, 3-4 times a day. Limit salt in your diet.  Avoid heavy lifting, wear low heals, and sit up straight.  Rest with your legs raised if you have leg cramps or low back pain.  Visit your dentist if you have not gone during your pregnancy. Use a soft toothbrush to brush your teeth. Be gentle when you floss.  You can have sex (intercourse) unless your doctor tells you not to.  Go to your doctor visits. Get help if:  You feel dizzy.  You have mild cramps or pressure in your lower belly (abdomen).  You have a nagging pain in your belly area.  You continue to feel sick to your stomach (nauseous), throw up (vomit), or have watery poop (diarrhea).  You have bad smelling fluid coming from your vagina.  You have pain with peeing (urination). Get help right away if:  You have a fever.  You are leaking fluid from your vagina.  You have spotting or bleeding from your vagina.  You have severe belly cramping or pain.  You lose or gain weight rapidly.  You have trouble catching your breath and have chest pain.  You notice sudden or extreme puffiness (swelling) of your face, hands, ankles, feet, or legs.  You have not felt the baby move in over an hour.  You have severe headaches that do not go away with medicine.  You have vision changes. This information is not intended to replace advice given to you by your health care provider.  Make sure you discuss any questions you have with your health care provider. Document Released: 07/31/2009 Document Revised: 10/12/2015 Document Reviewed: 07/07/2012 Elsevier Interactive Patient Education  2017 [employer].

## 2018-02-27 LAB — CERVICOVAGINAL ANCILLARY ONLY
Chlamydia: NEGATIVE
Neisseria Gonorrhea: NEGATIVE
Trichomonas: NEGATIVE

## 2018-02-28 LAB — OBSTETRIC PANEL, INCLUDING HIV
Antibody Screen: NEGATIVE
BASOS: 0 %
Basophils Absolute: 0 10*3/uL (ref 0.0–0.2)
EOS (ABSOLUTE): 0.5 10*3/uL — ABNORMAL HIGH (ref 0.0–0.4)
Eos: 8 %
HEMATOCRIT: 29.8 % — AB (ref 34.0–46.6)
HEMOGLOBIN: 9.7 g/dL — AB (ref 11.1–15.9)
HEP B S AG: NEGATIVE
HIV Screen 4th Generation wRfx: NONREACTIVE
IMMATURE GRANS (ABS): 0 10*3/uL (ref 0.0–0.1)
Immature Granulocytes: 0 %
LYMPHS: 19 %
Lymphocytes Absolute: 1.3 10*3/uL (ref 0.7–3.1)
MCH: 26.2 pg — ABNORMAL LOW (ref 26.6–33.0)
MCHC: 32.6 g/dL (ref 31.5–35.7)
MCV: 81 fL (ref 79–97)
MONOCYTES: 8 %
Monocytes Absolute: 0.5 10*3/uL (ref 0.1–0.9)
NEUTROS ABS: 4.4 10*3/uL (ref 1.4–7.0)
Neutrophils: 65 %
Platelets: 233 10*3/uL (ref 150–450)
RBC: 3.7 x10E6/uL — ABNORMAL LOW (ref 3.77–5.28)
RDW: 13.9 % (ref 12.3–15.4)
RH TYPE: POSITIVE
RPR: NONREACTIVE
RUBELLA: 3.46 {index} (ref >0.99)
WBC: 6.8 10*3/uL (ref 3.4–10.8)

## 2018-02-28 LAB — TSH: TSH: 0.504 u[IU]/mL (ref 0.450–4.500)

## 2018-02-28 LAB — HCV COMMENT:

## 2018-02-28 LAB — SICKLE CELL SCREEN: Sickle Cell Screen: NEGATIVE

## 2018-02-28 LAB — HEPATITIS C ANTIBODY (REFLEX)

## 2018-02-28 LAB — CULTURE, OB URINE

## 2018-02-28 LAB — FERRITIN: Ferritin: 57 ng/mL (ref 15–150)

## 2018-02-28 LAB — URINE CULTURE, OB REFLEX

## 2018-03-03 ENCOUNTER — Telehealth: Payer: Self-pay | Admitting: Family Medicine

## 2018-03-03 DIAGNOSIS — Z3400 Encounter for supervision of normal first pregnancy, unspecified trimester: Secondary | ICD-10-CM

## 2018-03-03 NOTE — Telephone Encounter (Signed)
[facility] just called concerning an order that was put in for this pt for an ultrasound. The order was put in wrong and the appt was made for Radiology when it was supposed to be put in under Maternal Fetal Care it needs to be put in as [place] [facility] Detailed and the CPT code is [phone]. The pts appt has been rescheduled for this Friday instead of tomorrow and these orders needs to be put in before that appt Friday. Please advise

## 2018-03-03 NOTE — Telephone Encounter (Signed)
New order placed.  ° °Henritta Mutz, MD  °Family Medicine Teaching Service  ° °

## 2018-03-03 NOTE — Telephone Encounter (Signed)
Will forward to Dr. Manson Passey who placed this order.  Jazmin Hartsell,CMA

## 2018-03-04 ENCOUNTER — Ambulatory Visit (HOSPITAL_COMMUNITY): Payer: Self-pay

## 2018-03-04 ENCOUNTER — Telehealth: Payer: Self-pay | Admitting: Family Medicine

## 2018-03-04 DIAGNOSIS — O99012 Anemia complicating pregnancy, second trimester: Secondary | ICD-10-CM

## 2018-03-04 DIAGNOSIS — O99019 Anemia complicating pregnancy, unspecified trimester: Secondary | ICD-10-CM

## 2018-03-04 HISTORY — DX: Anemia complicating pregnancy, unspecified trimester: O99.019

## 2018-03-04 MED ORDER — FERROUS SULFATE 325 (65 FE) MG PO TABS: 325.0000 mg | ORAL_TABLET | Freq: Every day | ORAL | 3 refills | Status: AC

## 2018-03-04 NOTE — Telephone Encounter (Signed)
Call patient with results of labs.  Reviewed prenatal labs in detail with patient.  Testing for infection was negative including hepatitis C, syphilis, HIV as well as other sexual transmitted infections.  She is rubella immune.  B positive.  Antibody screen is negative.  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  Her ferritin level is actually normal as well, which is not surprising given that this is normocytic.  Her sickle cell screen is negative.  She denies a family history of anemia.  No history of jaundice with medications or other symptoms of hemolysis.  She also has mild eosinophilia, although this may be due to her recent infection or other illness and could consider repeating.  Will start iron therapy.  Repeat ferritin as well as iron studies in [redacted] weeks along with CBC.  If not improving would consider evaluation with hemoglobin electrophoresis as well as a peripheral smear and labs for hemolysis.

## 2018-03-05 ENCOUNTER — Encounter: Payer: Self-pay | Admitting: Obstetrics and Gynecology

## 2018-03-05 NOTE — BH Specialist Note (Deleted)
Integrated Behavioral Health Initial Visit  MRN: [id number] Name: Erika Porter  Number of Integrated Behavioral Health Clinician visits:: 1/6 Session Start time: ***  Session End time: *** Total time: {IBH Total Time:21014050}  Type of Service: Integrated Behavioral Health- Individual/Family Interpretor:No. Interpretor Name and Language: n/a   Warm Hand Off Completed.       SUBJECTIVE: [name] is a 32 y.o. female accompanied by {CHL AMB ACCOMPANIED ZY:6063016010} Patient was referred by Nettie Elm, MD for Initial OB introduction to integrated behavioral health services . Patient reports the following symptoms/concerns: *** Duration of problem: ***; Severity of problem: {Mild/Moderate/Severe:20260}  OBJECTIVE: Mood: {BHH MOOD:22306} and Affect: {BHH AFFECT:22307} Risk of harm to self or others: {CHL AMB BH Suicide Current Mental Status:21022748}  LIFE CONTEXT: Family and Social: *** School/Work: *** Self-Care: *** Life Changes: Current pregnancy ***  GOALS ADDRESSED: Patient will: 1. Reduce symptoms of: {IBH Symptoms:21014056} 2. Increase knowledge and/or ability of: {IBH Patient Tools:21014057}  3. Demonstrate ability to: {IBH Goals:21014053}  INTERVENTIONS: Interventions utilized: {IBH Interventions:21014054}  Standardized Assessments completed: GAD-7 and PHQ 9 ***  ASSESSMENT: Patient currently experiencing Supervision of *** pregnancy, ***.   Patient may benefit from Initial OB introduction to integrated behavioral health services .  PLAN: 1. Follow up with behavioral health clinician on : *** 2. Behavioral recommendations: *** 3. Referral(s): {IBH Referrals:21014055} 4. "From scale of 1-10, how likely are you to follow plan?": ***  Valetta Close Axxel Gude, LCSW  ***

## 2018-03-06 ENCOUNTER — Ambulatory Visit (HOSPITAL_COMMUNITY): Admission: RE | Admit: 2018-03-06 | Payer: Self-pay | Source: Ambulatory Visit

## 2018-03-27 ENCOUNTER — Encounter: Payer: Self-pay | Admitting: *Deleted

## 2018-03-27 NOTE — Telephone Encounter (Signed)
This encounter was created in error - please disregard.

## 2018-04-14 ENCOUNTER — Telehealth: Payer: Self-pay | Admitting: Family Medicine

## 2018-04-14 NOTE — Telephone Encounter (Signed)
Patient called to inquire about making an OB appointment and about getting an ultrasound.  Patient was very hard to understand exactly what she needed but I did get that she is waiting for someone to call her back about these things since October.  Please call her to discuss.  Thank you.

## 2018-04-14 NOTE — Telephone Encounter (Signed)
Spoke with patient and she was talking about her high risk OB appt that she missed in October.  I gave her the number to call so she can get this scheduled at her convenience.  I also made her an appointment here as she hasn't been seen for OB since the beginning of October.  Jazmin Hartsell,CMA

## 2018-04-27 NOTE — Progress Notes (Deleted)
Erika Porter is a 32 y.o. [phone] at 4216w3d here for routine follow up.  She reports ***. See flow sheet for details.  A/P: Pregnancy at 5716w3d.  Doing well.   Pregnancy issues include polysubstance use, BV, late/minimal prenatal care. BV - received Flagyl x7 days*** Polysubstance use - counseled on cessation. H/o suppressed TSH - TSH 02/26/18 nl. Anatomy scan?*** 1hr GTT***  Preterm labor and fetal movement precautions reviewed. Follow up *** weeks.

## 2018-04-28 ENCOUNTER — Telehealth: Payer: Self-pay | Admitting: Family Medicine

## 2018-04-28 ENCOUNTER — Encounter: Payer: Medicaid Other | Admitting: Family Medicine

## 2018-04-30 ENCOUNTER — Ambulatory Visit (HOSPITAL_COMMUNITY)
Admission: RE | Admit: 2018-04-30 | Discharge: 2018-04-30 | Disposition: A | Discharge status: Home / Self Care | Payer: Medicaid Other | Source: Ambulatory Visit | Attending: Family Medicine | Admitting: Family Medicine

## 2018-04-30 ENCOUNTER — Encounter (HOSPITAL_COMMUNITY): Payer: Self-pay

## 2018-04-30 DIAGNOSIS — O99322 Drug use complicating pregnancy, second trimester: Secondary | ICD-10-CM | POA: Diagnosis not present

## 2018-04-30 DIAGNOSIS — Z363 Encounter for antenatal screening for malformations: Secondary | ICD-10-CM

## 2018-04-30 DIAGNOSIS — Z3A25 25 weeks gestation of pregnancy: Secondary | ICD-10-CM | POA: Diagnosis not present

## 2018-04-30 DIAGNOSIS — O0932 Supervision of pregnancy with insufficient antenatal care, second trimester: Secondary | ICD-10-CM | POA: Diagnosis not present

## 2018-04-30 DIAGNOSIS — O09892 Supervision of other high risk pregnancies, second trimester: Secondary | ICD-10-CM | POA: Diagnosis not present

## 2018-04-30 DIAGNOSIS — O09292 Supervision of pregnancy with other poor reproductive or obstetric history, second trimester: Secondary | ICD-10-CM

## 2018-04-30 DIAGNOSIS — Z3687 Encounter for antenatal screening for uncertain dates: Secondary | ICD-10-CM

## 2018-04-30 DIAGNOSIS — Z3400 Encounter for supervision of normal first pregnancy, unspecified trimester: Secondary | ICD-10-CM

## 2018-05-01 ENCOUNTER — Other Ambulatory Visit (HOSPITAL_COMMUNITY): Payer: Self-pay | Admitting: *Deleted

## 2018-05-01 DIAGNOSIS — O99322 Drug use complicating pregnancy, second trimester: Secondary | ICD-10-CM

## 2018-05-20 NOTE — L&D Delivery Note (Signed)
Delivery Note Progressed quickly to complete dilation while they were preparing for epidural,  Contractions were back to back with no rest between    SROM meconium fluid Patient combative and moving around bed  At 4:30 AM a viable and healthy female was delivered via Vaginal, Spontaneous (Presentation: OA  ).  APGAR: 9, 9; weight  .   Placenta status: Spontaneous and grossly intact with 3 vessel Cord:  with the following complications: .Several 3-4cm clots on maternal surface of placenta.  ? Abruptions.  Anesthesia:   Episiotomy: None Lacerations: Labial Suture Repair: none Est. Blood Loss (mL):  200  NICU team called and arrived, baby was vigorous and cleared. Mom to postpartum.  Baby to Couplet care / Skin to Skin.  Wynelle Bourgeois 07/20/2018, 6:08 AM   Please schedule this patient for Postpartum visit in: 4 weeks with the following provider: Any provider For C/S patients schedule nurse incision check in weeks 2 weeks: no High risk pregnancy complicated by: cocaine abuse Delivery mode:  SVD Anticipated Birth Control:  other/unsure PP Procedures needed: may need BP check  Schedule Integrated BH visit: no

## 2018-05-28 ENCOUNTER — Ambulatory Visit (HOSPITAL_COMMUNITY): Admission: RE | Admit: 2018-05-28 | Payer: Medicaid Other | Source: Ambulatory Visit

## 2018-05-28 ENCOUNTER — Encounter (HOSPITAL_COMMUNITY): Payer: Self-pay

## 2018-05-28 ENCOUNTER — Encounter (HOSPITAL_COMMUNITY): Payer: Self-pay | Admitting: *Deleted

## 2018-05-28 ENCOUNTER — Other Ambulatory Visit: Payer: Self-pay

## 2018-05-28 ENCOUNTER — Inpatient Hospital Stay (HOSPITAL_COMMUNITY)
Admission: AD | Admit: 2018-05-28 | Discharge: 2018-05-28 | Disposition: A | Discharge status: Home / Self Care | Payer: Medicaid Other | Source: Ambulatory Visit | Attending: Family Medicine | Admitting: Family Medicine

## 2018-05-28 DIAGNOSIS — O26893 Other specified pregnancy related conditions, third trimester: Secondary | ICD-10-CM | POA: Insufficient documentation

## 2018-05-28 DIAGNOSIS — R109 Unspecified abdominal pain: Secondary | ICD-10-CM | POA: Diagnosis present

## 2018-05-28 DIAGNOSIS — R05 Cough: Secondary | ICD-10-CM | POA: Insufficient documentation

## 2018-05-28 DIAGNOSIS — J069 Acute upper respiratory infection, unspecified: Secondary | ICD-10-CM | POA: Diagnosis not present

## 2018-05-28 DIAGNOSIS — O99333 Smoking (tobacco) complicating pregnancy, third trimester: Secondary | ICD-10-CM | POA: Diagnosis not present

## 2018-05-28 DIAGNOSIS — F17210 Nicotine dependence, cigarettes, uncomplicated: Secondary | ICD-10-CM | POA: Diagnosis not present

## 2018-05-28 DIAGNOSIS — Z3A30 30 weeks gestation of pregnancy: Secondary | ICD-10-CM

## 2018-05-28 DIAGNOSIS — M25519 Pain in unspecified shoulder: Secondary | ICD-10-CM | POA: Diagnosis not present

## 2018-05-28 LAB — URINALYSIS, ROUTINE W REFLEX MICROSCOPIC
Bilirubin Urine: NEGATIVE
Glucose, UA: NEGATIVE mg/dL
HGB URINE DIPSTICK: NEGATIVE
KETONES UR: 80 mg/dL — AB
NITRITE: NEGATIVE
PROTEIN: 100 mg/dL — AB
Specific Gravity, Urine: 1.027 (ref 1.005–1.030)
pH: 6 (ref 5.0–8.0)

## 2018-05-28 LAB — RAPID URINE DRUG SCREEN, HOSP PERFORMED
Amphetamines: NOT DETECTED
Barbiturates: NOT DETECTED
Benzodiazepines: NOT DETECTED
Cocaine: POSITIVE — AB
OPIATES: NOT DETECTED
Tetrahydrocannabinol: POSITIVE — AB

## 2018-05-28 LAB — INFLUENZA PANEL BY PCR (TYPE A & B)
INFLAPCR: NEGATIVE
Influenza B By PCR: NEGATIVE

## 2018-05-28 MED ORDER — GUAIFENESIN ER 600 MG PO TB12
1200.0000 mg | ORAL_TABLET | Freq: Once | ORAL | Status: AC
  Administered 2018-05-28: 1200 mg via ORAL
  Filled 2018-05-28: qty 2

## 2018-05-28 MED ORDER — GUAIFENESIN ER 600 MG PO TB12: 600.0000 mg | ORAL_TABLET | Freq: Two times a day (BID) | ORAL | 1 refills | Status: DC | PRN

## 2018-05-28 MED ORDER — LACTATED RINGERS IV SOLN
INTRAVENOUS | Status: DC
  Administered 2018-05-28: 20:00:00 via INTRAVENOUS

## 2018-05-28 MED ORDER — CYCLOBENZAPRINE HCL 10 MG PO TABS
10.0000 mg | ORAL_TABLET | Freq: Once | ORAL | Status: AC
  Administered 2018-05-28: 10 mg via ORAL
  Filled 2018-05-28: qty 1

## 2018-05-28 NOTE — Discharge Instructions (Signed)

## 2018-05-28 NOTE — MAU Provider Note (Addendum)
History     CSN: [id number]  Arrival date and time: 05/28/18 1610   None     Chief Complaint  Patient presents with  . Back Pain  . Abdominal Pain  . Cough   Erika Porter is a 33 y.o. [id number] at [redacted]w[redacted]d who presents for Back Pain; Abdominal Pain; and Cough.  She reports that she has been having vomiting, cough, and chest and back pain since 12/29.  She states she has taken tylenol (cold and cough) without relief and has not taken it today.  Patient denies a history of asthma, pneumonia, and did not receive a flu shot.  She reports fever and chills "just about everyday," but has not had one today.  She states that she takes iron supplementation and prenatal pills daily.  Patient endorses marijuana and cigarette usage, but has not smoked in a couple of days due to symptoms.  Patient also reports shoulder pain that started Monday.  She states it is intermittent and is aching..."it feels like something is in there grabbing something and making it hurt constantly."  Patient requests medications.  She endorses fetal movement and occasional contractions, but denies LoF, VB.     OB History    Gravida  7   Para  5   Term  5   Preterm  0   AB  1   Living  5     SAB  1   TAB  0   Ectopic  0   Multiple  0   Live Births  5           Past Medical History:  Diagnosis Date  . Anemia   . BV (bacterial vaginosis)   . Chlamydia   . Headache(784.0)   . Infection    uti  . Trichomonas     Past Surgical History:  Procedure Laterality Date  . WISDOM TOOTH EXTRACTION      Family History  Problem Relation Age of Onset  . Hypertension Mother   . Anxiety disorder Mother   . Asthma Sister   . Asthma Daughter   . Asthma Son   . Cancer Maternal Aunt   . Heart disease Maternal Grandfather     Social History   Tobacco Use  . Smoking status: Current Every Day Smoker    Packs/day: 1.00    Years: 4.00    Pack years: 4.00    Types: Cigarettes  . Smokeless tobacco:  Never Used  . Tobacco comment: quit with preg  Substance Use Topics  . Alcohol use: Not Currently    Alcohol/week: 0.0 standard drinks    Frequency: Never    Comment: not while preg  . Drug use: Not Currently    Types: Marijuana    Comment: Reports she cannot eat without marijuana     Allergies: No Known Allergies  Medications Prior to Admission  Medication Sig Dispense Refill Last Dose  . acetaminophen (TYLENOL) 500 MG tablet Take 2 tablets (1,000 mg total) by mouth every 6 (six) hours as needed for headache. (Patient taking differently: Take 1,000 mg by mouth every 6 (six) hours as needed for moderate pain or headache. ) 30 tablet 0 Past Week at Unknown time  . ferrous sulfate 325 (65 FE) MG tablet Take 1 tablet (325 mg total) by mouth daily with breakfast. 30 tablet 3 Taking  . Prenatal MV & Min w/FA-DHA (PRENATAL ADULT GUMMY/DHA/FA) 0.4-25 MG CHEW Chew 1 tablet by mouth daily. 30 tablet 9 Taking  Review of Systems  Constitutional: Positive for chills and fever.  HENT: Negative for congestion and sore throat.   Respiratory: Positive for cough and shortness of breath.   Cardiovascular: Positive for chest pain (With coughing).  Gastrointestinal: Positive for diarrhea (Soft frequent), nausea and vomiting. Negative for abdominal pain and constipation.  Genitourinary: Negative for dysuria.  Musculoskeletal: Positive for back pain. Negative for neck pain.  Neurological: Negative for dizziness, light-headedness and headaches.   Physical Exam   Blood pressure 120/74, pulse (!) 105, temperature 98.4 F (36.9 C), temperature source Oral, resp. rate (!) 26, weight 62.3 kg, last menstrual period 10/24/2017, SpO2 95 %, unknown if currently breastfeeding.  Physical Exam  Constitutional: She is oriented to person, place, and time. She appears well-developed and well-nourished.  HENT:  Head: Normocephalic and atraumatic.  Eyes: Conjunctivae are normal.  Neck: Normal range of motion.   Cardiovascular: Normal rate, regular rhythm and normal heart sounds.  Respiratory: Effort normal. No respiratory distress. She has rhonchi in the right lower field and the left lower field. She exhibits tenderness.  GI: Soft.  Musculoskeletal: Normal range of motion.  Neurological: She is alert and oriented to person, place, and time.  Skin: Skin is warm and dry.  Psychiatric: She has a normal mood and affect. Her behavior is normal.    MAU Course  Procedures Results for orders placed or performed during the hospital encounter of 05/28/18 (from the past 24 hour(s))  Urinalysis, Routine w reflex microscopic     Status: Abnormal   Collection Time: 05/28/18  6:50 PM  Result Value Ref Range   Color, Urine YELLOW YELLOW   APPearance HAZY (A) CLEAR   Specific Gravity, Urine 1.027 1.005 - 1.030   pH 6.0 5.0 - 8.0   Glucose, UA NEGATIVE NEGATIVE mg/dL   Hgb urine dipstick NEGATIVE NEGATIVE   Bilirubin Urine NEGATIVE NEGATIVE   Ketones, ur 80 (A) NEGATIVE mg/dL   Protein, ur 161100 (A) NEGATIVE mg/dL   Nitrite NEGATIVE NEGATIVE   Leukocytes, UA SMALL (A) NEGATIVE   RBC / HPF 0-5 0 - 5 RBC/hpf   WBC, UA 11-20 0 - 5 WBC/hpf   Bacteria, UA RARE (A) NONE SEEN   Squamous Epithelial / LPF 6-10 0 - 5   Mucus PRESENT   Urine rapid drug screen (hosp performed)     Status: Abnormal   Collection Time: 05/28/18  6:50 PM  Result Value Ref Range   Opiates NONE DETECTED NONE DETECTED   Cocaine POSITIVE (A) NONE DETECTED   Benzodiazepines NONE DETECTED NONE DETECTED   Amphetamines NONE DETECTED NONE DETECTED   Tetrahydrocannabinol POSITIVE (A) NONE DETECTED   Barbiturates NONE DETECTED NONE DETECTED    MDM PE Labs: UA, UDS, Influenza A/B,  Start IV IV Bolus Flexeril 10mg   Assessment and Plan  IUP at 30.6wks Reassuring NST  -PE Discussed -Discussed starting of IV and fluid bolus -Labs pending -Flexeril given for back and shoulder pain -Report given to M. Mayford KnifeWilliams, CNM; Care  Assumed  Cherre RobinsJessica L Emly MSN, CNM 05/28/2018, 7:21 PM   Tolerating fluids, feels better No problems walking to bathroom Will give Mucinex 1200mg  for cough UDS is positive for Cocaine and THC Once Flu test is back will discharge home  Aviva SignsWilliams, Inna Tisdell L, CNM   Results for orders placed or performed during the hospital encounter of 05/28/18 (from the past 24 hour(s))  Urinalysis, Routine w reflex microscopic     Status: Abnormal   Collection Time: 05/28/18  6:50 PM  Result Value Ref Range   Color, Urine YELLOW YELLOW   APPearance HAZY (A) CLEAR   Specific Gravity, Urine 1.027 1.005 - 1.030   pH 6.0 5.0 - 8.0   Glucose, UA NEGATIVE NEGATIVE mg/dL   Hgb urine dipstick NEGATIVE NEGATIVE   Bilirubin Urine NEGATIVE NEGATIVE   Ketones, ur 80 (A) NEGATIVE mg/dL   Protein, ur 161100 (A) NEGATIVE mg/dL   Nitrite NEGATIVE NEGATIVE   Leukocytes, UA SMALL (A) NEGATIVE   RBC / HPF 0-5 0 - 5 RBC/hpf   WBC, UA 11-20 0 - 5 WBC/hpf   Bacteria, UA RARE (A) NONE SEEN   Squamous Epithelial / LPF 6-10 0 - 5   Mucus PRESENT   Urine rapid drug screen (hosp performed)     Status: Abnormal   Collection Time: 05/28/18  6:50 PM  Result Value Ref Range   Opiates NONE DETECTED NONE DETECTED   Cocaine POSITIVE (A) NONE DETECTED   Benzodiazepines NONE DETECTED NONE DETECTED   Amphetamines NONE DETECTED NONE DETECTED   Tetrahydrocannabinol POSITIVE (A) NONE DETECTED   Barbiturates NONE DETECTED NONE DETECTED  Influenza panel by PCR (type A & B)     Status: None   Collection Time: 05/28/18  7:44 PM  Result Value Ref Range   Influenza A By PCR NEGATIVE NEGATIVE   Influenza B By PCR NEGATIVE NEGATIVE   Discussed URI and usual treatment Will rx Mucinex for cough Recommended to her that she abstain from Cigarettes, THC and cocaine Message sent to clinic to r/s appt (states missed appts due to her kids losing her keys) Encouraged to return here or to other Urgent Care/ED if she develops worsening of symptoms,  increase in pain, fever, or other concerning symptoms.    Aviva SignsWilliams, Siara Gorder L, CNM

## 2018-05-28 NOTE — MAU Note (Signed)
Pt states she has had vomiting, coughing, chest pain & back pain since 12/29.  Started having severe L shoulder pain 4 days ago after doing laundry.  Intermittent spotting for the past week.

## 2018-05-28 NOTE — MAU Note (Signed)
Pt reports vomiting.   Pt reports lower stomach, chest, and back pain because she has been coughing a lot.   Pt states she cant sleep.    Denies reports spotting, Denies LOF.   Reports +FM

## 2018-06-08 ENCOUNTER — Encounter: Payer: Self-pay | Admitting: Family Medicine

## 2018-07-03 ENCOUNTER — Encounter (HOSPITAL_COMMUNITY): Payer: Self-pay

## 2018-07-03 ENCOUNTER — Inpatient Hospital Stay (HOSPITAL_COMMUNITY)
Admission: AD | Admit: 2018-07-03 | Discharge: 2018-07-03 | Disposition: A | Discharge status: Home / Self Care | Payer: Medicaid Other | Attending: Obstetrics and Gynecology | Admitting: Obstetrics and Gynecology

## 2018-07-03 DIAGNOSIS — O99019 Anemia complicating pregnancy, unspecified trimester: Secondary | ICD-10-CM | POA: Diagnosis present

## 2018-07-03 DIAGNOSIS — O99320 Drug use complicating pregnancy, unspecified trimester: Secondary | ICD-10-CM | POA: Diagnosis present

## 2018-07-03 DIAGNOSIS — R030 Elevated blood-pressure reading, without diagnosis of hypertension: Secondary | ICD-10-CM

## 2018-07-03 DIAGNOSIS — O4703 False labor before 37 completed weeks of gestation, third trimester: Secondary | ICD-10-CM

## 2018-07-03 DIAGNOSIS — Z3A36 36 weeks gestation of pregnancy: Secondary | ICD-10-CM | POA: Diagnosis not present

## 2018-07-03 DIAGNOSIS — F1490 Cocaine use, unspecified, uncomplicated: Secondary | ICD-10-CM | POA: Diagnosis present

## 2018-07-03 DIAGNOSIS — O0940 Supervision of pregnancy with grand multiparity, unspecified trimester: Secondary | ICD-10-CM

## 2018-07-03 DIAGNOSIS — Z3009 Encounter for other general counseling and advice on contraception: Secondary | ICD-10-CM | POA: Diagnosis present

## 2018-07-03 HISTORY — DX: Elevated blood-pressure reading, without diagnosis of hypertension: R03.0

## 2018-07-03 LAB — RAPID URINE DRUG SCREEN, HOSP PERFORMED
AMPHETAMINES: NOT DETECTED
Barbiturates: NOT DETECTED
Benzodiazepines: NOT DETECTED
Cocaine: POSITIVE — AB
Opiates: NOT DETECTED
Tetrahydrocannabinol: POSITIVE — AB

## 2018-07-03 LAB — URINALYSIS, MICROSCOPIC (REFLEX): RBC / HPF: NONE SEEN RBC/hpf (ref 0–5)

## 2018-07-03 LAB — CBC
HCT: 29.8 % — ABNORMAL LOW (ref 36.0–46.0)
Hemoglobin: 9.5 g/dL — ABNORMAL LOW (ref 12.0–15.0)
MCH: 25.5 pg — ABNORMAL LOW (ref 26.0–34.0)
MCHC: 31.9 g/dL (ref 30.0–36.0)
MCV: 79.9 fL — ABNORMAL LOW (ref 80.0–100.0)
Platelets: 248 10*3/uL (ref 150–400)
RBC: 3.73 MIL/uL — AB (ref 3.87–5.11)
RDW: 14.6 % (ref 11.5–15.5)
WBC: 8.8 10*3/uL (ref 4.0–10.5)
nRBC: 0 % (ref 0.0–0.2)

## 2018-07-03 LAB — COMPREHENSIVE METABOLIC PANEL
ALT: 13 U/L (ref 0–44)
ANION GAP: 8 (ref 5–15)
AST: 22 U/L (ref 15–41)
Albumin: 2.7 g/dL — ABNORMAL LOW (ref 3.5–5.0)
Alkaline Phosphatase: 108 U/L (ref 38–126)
BILIRUBIN TOTAL: 0.7 mg/dL (ref 0.3–1.2)
BUN: 8 mg/dL (ref 6–20)
CO2: 21 mmol/L — ABNORMAL LOW (ref 22–32)
Calcium: 8.7 mg/dL — ABNORMAL LOW (ref 8.9–10.3)
Chloride: 105 mmol/L (ref 98–111)
Creatinine, Ser: 0.54 mg/dL (ref 0.44–1.00)
GFR calc Af Amer: >60 mL/min (ref >60)
GFR calc non Af Amer: >60 mL/min (ref >60)
Glucose, Bld: 82 mg/dL (ref 70–99)
Potassium: 3.6 mmol/L (ref 3.5–5.1)
Sodium: 134 mmol/L — ABNORMAL LOW (ref 135–145)
Total Protein: 7 g/dL (ref 6.5–8.1)

## 2018-07-03 LAB — URINALYSIS, ROUTINE W REFLEX MICROSCOPIC
Bilirubin Urine: NEGATIVE
Glucose, UA: NEGATIVE mg/dL
Hgb urine dipstick: NEGATIVE
Ketones, ur: NEGATIVE mg/dL
Nitrite: NEGATIVE
Protein, ur: NEGATIVE mg/dL
Specific Gravity, Urine: 1.01 (ref 1.005–1.030)
pH: 7 (ref 5.0–8.0)

## 2018-07-03 LAB — PROTEIN / CREATININE RATIO, URINE
Creatinine, Urine: 72 mg/dL
PROTEIN CREATININE RATIO: 0.18 mg/mg{creat} — AB (ref 0.00–0.15)
TOTAL PROTEIN, URINE: 13 mg/dL

## 2018-07-03 NOTE — Discharge Instructions (Signed)
Braxton Hicks Contractions Contractions of the uterus can occur throughout pregnancy, but they are not always a sign that you are in labor. You may have practice contractions called Braxton Hicks contractions. These false labor contractions are sometimes confused with true labor. What are Braxton Hicks contractions? Braxton Hicks contractions are tightening movements that occur in the muscles of the uterus before labor. Unlike true labor contractions, these contractions do not result in opening (dilation) and thinning of the cervix. Toward the end of pregnancy (32-34 weeks), Braxton Hicks contractions can happen more often and may become stronger. These contractions are sometimes difficult to tell apart from true labor because they can be very uncomfortable. You should not feel embarrassed if you go to the hospital with false labor. Sometimes, the only way to tell if you are in true labor is for your health care provider to look for changes in the cervix. The health care provider will do a physical exam and may monitor your contractions. If you are not in true labor, the exam should show that your cervix is not dilating and your water has not broken. If there are no other health problems associated with your pregnancy, it is completely safe for you to be sent home with false labor. You may continue to have Braxton Hicks contractions until you go into true labor. How to tell the difference between true labor and false labor True labor  Contractions last 30-70 seconds.  Contractions become very regular.  Discomfort is usually felt in the top of the uterus, and it spreads to the lower abdomen and low back.  Contractions do not go away with walking.  Contractions usually become more intense and increase in frequency.  The cervix dilates and gets thinner. False labor  Contractions are usually shorter and not as strong as true labor contractions.  Contractions are usually irregular.  Contractions  are often felt in the front of the lower abdomen and in the groin.  Contractions may go away when you walk around or change positions while lying down.  Contractions get weaker and are shorter-lasting as time goes on.  The cervix usually does not dilate or become thin. Follow these instructions at home:   Take over-the-counter and prescription medicines only as told by your health care provider.  Keep up with your usual exercises and follow other instructions from your health care provider.  Eat and drink lightly if you think you are going into labor.  If Braxton Hicks contractions are making you uncomfortable: ? Change your position from lying down or resting to walking, or change from walking to resting. ? Sit and rest in a tub of warm water. ? Drink enough fluid to keep your urine pale yellow. Dehydration may cause these contractions. ? Do slow and deep breathing several times an hour.  Keep all follow-up prenatal visits as told by your health care provider. This is important. Contact a health care provider if:  You have a fever.  You have continuous pain in your abdomen. Get help right away if:  Your contractions become stronger, more regular, and closer together.  You have fluid leaking or gushing from your vagina.  You pass blood-tinged mucus (bloody show).  You have bleeding from your vagina.  You have low back pain that you never had before.  You feel your baby's head pushing down and causing pelvic pressure.  Your baby is not moving inside you as much as it used to. Summary  Contractions that occur before labor are   called Braxton Hicks contractions, false labor, or practice contractions.  Braxton Hicks contractions are usually shorter, weaker, farther apart, and less regular than true labor contractions. True labor contractions usually become progressively stronger and regular, and they become more frequent.  Manage discomfort from Braxton Hicks contractions  by changing position, resting in a warm bath, drinking plenty of water, or practicing deep breathing. This information is not intended to replace advice given to you by your health care provider. Make sure you discuss any questions you have with your health care provider. Document Released: 09/19/2016 Document Revised: 02/18/2017 Document Reviewed: 09/19/2016 Elsevier Interactive Patient Education  2019 [employer]  

## 2018-07-03 NOTE — MAU Note (Signed)
I have communicated with Dr. Aneta Mins and reviewed vital signs:  Vitals:   07/03/18 0445 07/03/18 0531  BP: 111/62 111/62  Pulse: 98   Resp:    Temp:    SpO2:      Vaginal exam:  Dilation: 2.5 Effacement (%): 50 Station: -3 Presentation: Vertex Exam by:: T Velia Meyer RN ,   Also reviewed contraction pattern and that non-stress test is reactive.  It has been documented that patient is contracting irregularly minutes with no cervical change over 1 hours not indicating active labor.  Patient denies any other complaints.  Based on this report provider has given order for discharge.  A discharge order and diagnosis entered by a provider.   Labor discharge instructions reviewed with patient.

## 2018-07-03 NOTE — MAU Note (Signed)
Arrived via EMS. Cntrx since 0030 after having intercourse. Denies LOF or bleeding. _+FM

## 2018-07-06 ENCOUNTER — Ambulatory Visit (INDEPENDENT_AMBULATORY_CARE_PROVIDER_SITE_OTHER): Payer: Medicaid Other | Admitting: Family Medicine

## 2018-07-06 ENCOUNTER — Other Ambulatory Visit (HOSPITAL_COMMUNITY)
Admission: RE | Admit: 2018-07-06 | Discharge: 2018-07-06 | Disposition: A | Discharge status: Home / Self Care | Payer: Medicaid Other | Source: Ambulatory Visit | Attending: Obstetrics and Gynecology | Admitting: Obstetrics and Gynecology

## 2018-07-06 ENCOUNTER — Encounter: Payer: Self-pay | Admitting: Family Medicine

## 2018-07-06 VITALS — BP 113/72 | HR 106 | Wt 144.9 lb

## 2018-07-06 DIAGNOSIS — O0933 Supervision of pregnancy with insufficient antenatal care, third trimester: Secondary | ICD-10-CM | POA: Diagnosis not present

## 2018-07-06 DIAGNOSIS — Z3A36 36 weeks gestation of pregnancy: Secondary | ICD-10-CM

## 2018-07-06 DIAGNOSIS — F1490 Cocaine use, unspecified, uncomplicated: Secondary | ICD-10-CM

## 2018-07-06 DIAGNOSIS — O99323 Drug use complicating pregnancy, third trimester: Secondary | ICD-10-CM

## 2018-07-06 DIAGNOSIS — O0943 Supervision of pregnancy with grand multiparity, third trimester: Secondary | ICD-10-CM | POA: Diagnosis not present

## 2018-07-06 DIAGNOSIS — R7989 Other specified abnormal findings of blood chemistry: Secondary | ICD-10-CM

## 2018-07-06 DIAGNOSIS — R030 Elevated blood-pressure reading, without diagnosis of hypertension: Secondary | ICD-10-CM

## 2018-07-06 DIAGNOSIS — O99320 Drug use complicating pregnancy, unspecified trimester: Secondary | ICD-10-CM

## 2018-07-06 NOTE — Progress Notes (Signed)
Subjective:  Erika Porter is a [id number] [redacted]w[redacted]d being seen today for her first obstetrical visit.  Her obstetrical history is significant for grand multiparicity, cocaine use. She is late to prenatal care. Had initial labwork done at [facility]. Unplanned pregnancy, FOB involved. Patient does not intend to breast feed. Pregnancy history fully reviewed.  Patient reports no complaints.  BP 113/72   Pulse (!) 106   Wt 144 lb 14.4 oz (65.7 kg)   LMP 10/24/2017 (Within Days)   BMI 22.69 kg/m   HISTORY: OB History  Gravida Para Term Preterm AB Living  7 5 5  0 1 5  SAB TAB Ectopic Multiple Live Births  1 0 0 0 5    # Outcome Date GA Lbr Len/2nd Weight Sex Delivery Anes PTL Lv  7 Current           6 Term 05/16/17 [redacted]w[redacted]d / 00:07 6 lb 9.8 oz (3 kg) M Vag-Spont EPI  LIV  5 Term 07/16/13 [redacted]w[redacted]d 18:25 / 00:24 6 lb 10 oz (3.005 kg) M Vag-Spont EPI  LIV  4 Term 04/04/09 [redacted]w[redacted]d   F Vag-Spont EPI  LIV  3 Term 01/03/04 [redacted]w[redacted]d  7 lb 12 oz (3.515 kg) M Vag-Spont EPI  LIV  2 Term 11/01/00 [redacted]w[redacted]d 02:00 5 lb 6 oz (2.438 kg) M Vag-Spont EPI  LIV  1 SAB             Past Medical History:  Diagnosis Date  . Anemia   . BV (bacterial vaginosis)   . Chlamydia   . Headache(784.0)   . Infection    uti  . Trichomonas     Past Surgical History:  Procedure Laterality Date  . WISDOM TOOTH EXTRACTION      Family History  Problem Relation Age of Onset  . Hypertension Mother   . Anxiety disorder Mother   . Asthma Sister   . Asthma Daughter   . Asthma Son   . Cancer Maternal Aunt   . Heart disease Maternal Grandfather      Exam  BP 113/72   Pulse (!) 106   Wt 144 lb 14.4 oz (65.7 kg)   LMP 10/24/2017 (Within Days)   BMI 22.69 kg/m   CONSTITUTIONAL: Well-developed, well-nourished female in no acute distress.  HENT:  Normocephalic, atraumatic, External right and left ear normal. Oropharynx is clear and moist EYES: Conjunctivae and EOM are normal. Pupils are equal, round, and reactive  to light. No scleral icterus.  NECK: Normal range of motion, supple, no masses.  Normal thyroid.  CARDIOVASCULAR: Normal heart rate noted, regular rhythm RESPIRATORY: Clear to auscultation bilaterally. Effort and breath sounds normal, no problems with respiration noted. ABDOMEN: Soft, normal bowel sounds, no distention noted.  No tenderness, rebound or guarding.  PELVIC: Normal appearing external genitalia; normal appearing vaginal mucosa and cervix. No abnormal discharge noted.  MUSCULOSKELETAL: Normal range of motion. No tenderness.  No cyanosis, clubbing, or edema.  2+ distal pulses. SKIN: Skin is warm and dry. No rash noted. Not diaphoretic. No erythema. No pallor. NEUROLOGIC: Alert and oriented to person, place, and time. Normal reflexes, muscle tone coordination. No cranial nerve deficit noted. PSYCHIATRIC: Normal mood and affect. Normal behavior. Normal judgment and thought content.    Assessment:    Pregnancy: [id number] Patient Active Problem List   Diagnosis Date Noted  . Unwanted fertility 07/03/2018  . Elevated blood pressure reading without diagnosis of hypertension 07/03/2018  . Anemia in pregnancy 03/04/2018  . Abnormal TSH 02/26/2018  .  Pap smear abnormality of cervix/human papillomavirus (HPV) positive 02/26/2018  . 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 Supervision of pregnancy with grand multiparity 12/05/2016  . Allergic rhinitis 11/18/2011  . TOBACCO USER 09/12/2008  . Substance abuse affecting pregnancy, antepartum 09/12/2008      Plan:   1. Supervision of pregnancy with grand multiparity in third trimester FHT and FH normal.  2. Cocaine use complicating pregnancy Discussed complication of cocaine use, including IUGR and placental abruption/fetal death.  3. Abnormal TSH Recheck TSH  4. Elevated blood pressure reading without diagnosis of hypertension BP normal     Problem list reviewed and updated. 75% of 20 min visit spent on counseling and  coordination of care.     Levie Heritage 07/06/2018

## 2018-07-07 LAB — TSH: TSH: 0.972 u[IU]/mL (ref 0.450–4.500)

## 2018-07-07 LAB — T4, FREE: Free T4: 1.07 ng/dL (ref 0.82–1.77)

## 2018-07-07 LAB — T3, FREE: T3, Free: 2.7 pg/mL (ref 2.0–4.4)

## 2018-07-07 LAB — GC/CHLAMYDIA PROBE AMP (~~LOC~~) NOT AT ARMC
Chlamydia: NEGATIVE
Neisseria Gonorrhea: NEGATIVE

## 2018-07-09 ENCOUNTER — Encounter (HOSPITAL_COMMUNITY): Payer: Self-pay

## 2018-07-09 ENCOUNTER — Ambulatory Visit (HOSPITAL_COMMUNITY)
Admission: RE | Admit: 2018-07-09 | Discharge: 2018-07-09 | Disposition: A | Discharge status: Home / Self Care | Payer: Medicaid Other | Source: Ambulatory Visit | Attending: Maternal and Fetal Medicine | Admitting: Maternal and Fetal Medicine

## 2018-07-09 DIAGNOSIS — O99323 Drug use complicating pregnancy, third trimester: Secondary | ICD-10-CM | POA: Diagnosis not present

## 2018-07-09 DIAGNOSIS — O99322 Drug use complicating pregnancy, second trimester: Secondary | ICD-10-CM | POA: Insufficient documentation

## 2018-07-09 DIAGNOSIS — Z3A35 35 weeks gestation of pregnancy: Secondary | ICD-10-CM

## 2018-07-09 DIAGNOSIS — O0933 Supervision of pregnancy with insufficient antenatal care, third trimester: Secondary | ICD-10-CM | POA: Diagnosis not present

## 2018-07-09 DIAGNOSIS — O09293 Supervision of pregnancy with other poor reproductive or obstetric history, third trimester: Secondary | ICD-10-CM | POA: Diagnosis not present

## 2018-07-09 DIAGNOSIS — O09893 Supervision of other high risk pregnancies, third trimester: Secondary | ICD-10-CM

## 2018-07-09 LAB — CULTURE, BETA STREP (GROUP B ONLY): Strep Gp B Culture: NEGATIVE

## 2018-07-15 ENCOUNTER — Encounter: Payer: Self-pay | Admitting: Obstetrics & Gynecology

## 2018-07-20 ENCOUNTER — Other Ambulatory Visit: Payer: Self-pay

## 2018-07-20 ENCOUNTER — Inpatient Hospital Stay (HOSPITAL_COMMUNITY)
Admission: AD | Admit: 2018-07-20 | Discharge: 2018-07-22 | DRG: 806 | Disposition: A | Discharge status: Home / Self Care | Payer: Medicaid Other | Attending: Obstetrics & Gynecology | Admitting: Obstetrics & Gynecology

## 2018-07-20 ENCOUNTER — Encounter (HOSPITAL_COMMUNITY): Payer: Self-pay

## 2018-07-20 DIAGNOSIS — F17210 Nicotine dependence, cigarettes, uncomplicated: Secondary | ICD-10-CM | POA: Diagnosis present

## 2018-07-20 DIAGNOSIS — O99334 Smoking (tobacco) complicating childbirth: Secondary | ICD-10-CM | POA: Diagnosis present

## 2018-07-20 DIAGNOSIS — D649 Anemia, unspecified: Secondary | ICD-10-CM | POA: Diagnosis present

## 2018-07-20 DIAGNOSIS — O9902 Anemia complicating childbirth: Secondary | ICD-10-CM | POA: Diagnosis present

## 2018-07-20 DIAGNOSIS — F1210 Cannabis abuse, uncomplicated: Secondary | ICD-10-CM | POA: Diagnosis present

## 2018-07-20 DIAGNOSIS — Z3A38 38 weeks gestation of pregnancy: Secondary | ICD-10-CM | POA: Diagnosis not present

## 2018-07-20 DIAGNOSIS — O99324 Drug use complicating childbirth: Secondary | ICD-10-CM | POA: Diagnosis present

## 2018-07-20 DIAGNOSIS — Z3009 Encounter for other general counseling and advice on contraception: Secondary | ICD-10-CM | POA: Diagnosis present

## 2018-07-20 DIAGNOSIS — O479 False labor, unspecified: Secondary | ICD-10-CM | POA: Diagnosis present

## 2018-07-20 DIAGNOSIS — O0940 Supervision of pregnancy with grand multiparity, unspecified trimester: Secondary | ICD-10-CM

## 2018-07-20 DIAGNOSIS — F1410 Cocaine abuse, uncomplicated: Secondary | ICD-10-CM | POA: Diagnosis present

## 2018-07-20 DIAGNOSIS — F1490 Cocaine use, unspecified, uncomplicated: Secondary | ICD-10-CM | POA: Diagnosis present

## 2018-07-20 DIAGNOSIS — O99019 Anemia complicating pregnancy, unspecified trimester: Secondary | ICD-10-CM | POA: Diagnosis present

## 2018-07-20 DIAGNOSIS — O99320 Drug use complicating pregnancy, unspecified trimester: Secondary | ICD-10-CM | POA: Diagnosis present

## 2018-07-20 DIAGNOSIS — O26893 Other specified pregnancy related conditions, third trimester: Secondary | ICD-10-CM | POA: Diagnosis present

## 2018-07-20 HISTORY — DX: Allergic rhinitis, unspecified: J30.9

## 2018-07-20 LAB — RAPID URINE DRUG SCREEN, HOSP PERFORMED
AMPHETAMINES: NOT DETECTED
Barbiturates: NOT DETECTED
Benzodiazepines: NOT DETECTED
Cocaine: POSITIVE — AB
Opiates: NOT DETECTED
Tetrahydrocannabinol: POSITIVE — AB

## 2018-07-20 LAB — TYPE AND SCREEN
ABO/RH(D): B POS
Antibody Screen: NEGATIVE

## 2018-07-20 LAB — ABO/RH: ABO/RH(D): B POS

## 2018-07-20 MED ORDER — FENTANYL CITRATE (PF) 100 MCG/2ML IJ SOLN
100.0000 ug | Freq: Once | INTRAMUSCULAR | Status: AC
  Administered 2018-07-20: 100 ug via INTRAVENOUS

## 2018-07-20 MED ORDER — PHENYLEPHRINE 40 MCG/ML (10ML) SYRINGE FOR IV PUSH (FOR BLOOD PRESSURE SUPPORT): 80.0000 ug | PREFILLED_SYRINGE | INTRAVENOUS | Status: DC | PRN

## 2018-07-20 MED ORDER — PRENATAL MULTIVITAMIN CH
1.0000 | ORAL_TABLET | Freq: Every day | ORAL | Status: DC
  Administered 2018-07-20 – 2018-07-22 (×3): 1 via ORAL
  Filled 2018-07-20 (×2): qty 1

## 2018-07-20 MED ORDER — LACTATED RINGERS IV BOLUS: 1000.0000 mL | INTRAVENOUS | Status: DC

## 2018-07-20 MED ORDER — DIPHENHYDRAMINE HCL 50 MG/ML IJ SOLN: 12.5000 mg | INTRAMUSCULAR | Status: DC | PRN

## 2018-07-20 MED ORDER — COCONUT OIL OIL: 1.0000 "application " | TOPICAL_OIL | Status: DC | PRN

## 2018-07-20 MED ORDER — ACETAMINOPHEN 325 MG PO TABS: 650.0000 mg | ORAL_TABLET | ORAL | Status: DC | PRN

## 2018-07-20 MED ORDER — SIMETHICONE 80 MG PO CHEW: 80.0000 mg | CHEWABLE_TABLET | ORAL | Status: DC | PRN

## 2018-07-20 MED ORDER — ONDANSETRON HCL 4 MG/2ML IJ SOLN: 4.0000 mg | Freq: Four times a day (QID) | INTRAMUSCULAR | Status: DC | PRN

## 2018-07-20 MED ORDER — ACETAMINOPHEN 325 MG PO TABS
650.0000 mg | ORAL_TABLET | ORAL | Status: DC | PRN
  Administered 2018-07-21: 650 mg via ORAL
  Filled 2018-07-20: qty 2

## 2018-07-20 MED ORDER — BENZOCAINE-MENTHOL 20-0.5 % EX AERO: 1.0000 "application " | INHALATION_SPRAY | CUTANEOUS | Status: DC | PRN

## 2018-07-20 MED ORDER — OXYTOCIN BOLUS FROM INFUSION
500.0000 mL | Freq: Once | INTRAVENOUS | Status: AC
  Administered 2018-07-20: 500 mL via INTRAVENOUS

## 2018-07-20 MED ORDER — TETANUS-DIPHTH-ACELL PERTUSSIS 5-2.5-18.5 LF-MCG/0.5 IM SUSP: 0.5000 mL | Freq: Once | INTRAMUSCULAR | Status: DC

## 2018-07-20 MED ORDER — OXYCODONE HCL 5 MG PO TABS
5.0000 mg | ORAL_TABLET | ORAL | Status: DC | PRN
  Administered 2018-07-20 – 2018-07-21 (×4): 5 mg via ORAL
  Filled 2018-07-20 (×5): qty 1

## 2018-07-20 MED ORDER — SENNOSIDES-DOCUSATE SODIUM 8.6-50 MG PO TABS
2.0000 | ORAL_TABLET | ORAL | Status: DC
  Administered 2018-07-21 (×2): 2 via ORAL
  Filled 2018-07-20 (×2): qty 2

## 2018-07-20 MED ORDER — OXYTOCIN 40 UNITS IN NORMAL SALINE INFUSION - SIMPLE MED
2.5000 [IU]/h | INTRAVENOUS | Status: DC
  Administered 2018-07-20: 2.5 [IU]/h via INTRAVENOUS

## 2018-07-20 MED ORDER — LACTATED RINGERS IV SOLN: 500.0000 mL | Freq: Once | INTRAVENOUS | Status: DC

## 2018-07-20 MED ORDER — FENTANYL CITRATE (PF) 100 MCG/2ML IJ SOLN
INTRAMUSCULAR | Status: AC
  Filled 2018-07-20: qty 2

## 2018-07-20 MED ORDER — FENTANYL CITRATE (PF) 100 MCG/2ML IJ SOLN
100.0000 ug | INTRAMUSCULAR | Status: DC | PRN
  Administered 2018-07-20: 100 ug via INTRAVENOUS
  Filled 2018-07-20: qty 2

## 2018-07-20 MED ORDER — DIPHENHYDRAMINE HCL 50 MG/ML IJ SOLN
25.0000 mg | Freq: Once | INTRAMUSCULAR | Status: AC
  Administered 2018-07-20: 25 mg via INTRAVENOUS
  Filled 2018-07-20: qty 1

## 2018-07-20 MED ORDER — EPHEDRINE 5 MG/ML INJ: 10.0000 mg | INTRAVENOUS | Status: DC | PRN

## 2018-07-20 MED ORDER — WITCH HAZEL-GLYCERIN EX PADS: 1.0000 "application " | MEDICATED_PAD | CUTANEOUS | Status: DC | PRN

## 2018-07-20 MED ORDER — FENTANYL-BUPIVACAINE-NACL 0.5-0.125-0.9 MG/250ML-% EP SOLN: 12.0000 mL/h | EPIDURAL | Status: DC | PRN

## 2018-07-20 MED ORDER — PHENYLEPHRINE 40 MCG/ML (10ML) SYRINGE FOR IV PUSH (FOR BLOOD PRESSURE SUPPORT)
PREFILLED_SYRINGE | INTRAVENOUS | Status: AC
  Filled 2018-07-20: qty 10

## 2018-07-20 MED ORDER — DIBUCAINE 1 % RE OINT: 1.0000 "application " | TOPICAL_OINTMENT | RECTAL | Status: DC | PRN

## 2018-07-20 MED ORDER — OXYCODONE-ACETAMINOPHEN 5-325 MG PO TABS: 2.0000 | ORAL_TABLET | ORAL | Status: DC | PRN

## 2018-07-20 MED ORDER — ZOLPIDEM TARTRATE 5 MG PO TABS: 5.0000 mg | ORAL_TABLET | Freq: Every evening | ORAL | Status: DC | PRN

## 2018-07-20 MED ORDER — DIPHENHYDRAMINE HCL 25 MG PO CAPS: 25.0000 mg | ORAL_CAPSULE | Freq: Four times a day (QID) | ORAL | Status: DC | PRN

## 2018-07-20 MED ORDER — LIDOCAINE HCL (PF) 1 % IJ SOLN: 30.0000 mL | INTRAMUSCULAR | Status: DC | PRN

## 2018-07-20 MED ORDER — FENTANYL-BUPIVACAINE-NACL 0.5-0.125-0.9 MG/250ML-% EP SOLN
EPIDURAL | Status: AC
  Filled 2018-07-20: qty 250

## 2018-07-20 MED ORDER — ONDANSETRON HCL 4 MG PO TABS: 4.0000 mg | ORAL_TABLET | ORAL | Status: DC | PRN

## 2018-07-20 MED ORDER — ONDANSETRON HCL 4 MG/2ML IJ SOLN: 4.0000 mg | INTRAMUSCULAR | Status: DC | PRN

## 2018-07-20 MED ORDER — OXYTOCIN 40 UNITS IN NORMAL SALINE INFUSION - SIMPLE MED
INTRAVENOUS | Status: AC
  Filled 2018-07-20: qty 1000

## 2018-07-20 MED ORDER — LACTATED RINGERS IV SOLN: 500.0000 mL | INTRAVENOUS | Status: DC | PRN

## 2018-07-20 MED ORDER — SOD CITRATE-CITRIC ACID 500-334 MG/5ML PO SOLN: 30.0000 mL | ORAL | Status: DC | PRN

## 2018-07-20 MED ORDER — KETOROLAC TROMETHAMINE 30 MG/ML IJ SOLN
30.0000 mg | Freq: Once | INTRAMUSCULAR | Status: AC
  Administered 2018-07-20: 30 mg via INTRAVENOUS
  Filled 2018-07-20: qty 1

## 2018-07-20 MED ORDER — LACTATED RINGERS IV SOLN: INTRAVENOUS | Status: DC

## 2018-07-20 MED ORDER — OXYCODONE-ACETAMINOPHEN 5-325 MG PO TABS: 1.0000 | ORAL_TABLET | ORAL | Status: DC | PRN

## 2018-07-20 MED ORDER — IBUPROFEN 600 MG PO TABS
600.0000 mg | ORAL_TABLET | Freq: Four times a day (QID) | ORAL | Status: DC
  Administered 2018-07-20 – 2018-07-22 (×9): 600 mg via ORAL
  Filled 2018-07-20 (×8): qty 1

## 2018-07-20 NOTE — Progress Notes (Signed)
Pt in bathroom already when I entered to take pt to bathroom. She was fine to walk, needed a little help with the peri care aspect. I instructed pt that she was able to go to the bathroom without a staff member unless she needed help. Pt then asked when breakfast was going to be delivered. I informed her that she had to call in her order that they were not going to just bring her something. Pt instructed on how to order her food. She stated she was in a lot of pain and wanted new heat packs. New heat packs were given.  MOB had placed a burp cloth over the baby's face because "the sun was too bright in his eyes and she needed some sleep." I removed the cloth off of the baby's face, and he was spitting up a little. I informed the MOB that that was a perfect example as to why the baby's face did not need to be covered. MOB said ok and laid her head back and fell asleep.

## 2018-07-20 NOTE — Progress Notes (Signed)
[facility]N Attending Note  Subjective:  Called to evaluate patient who just arrived from MAU to L&D but screaming in pain and reporting increased pelvic pressure. Was 2.5 cm dilated in MAU. FHR tracing had minimal-moderate variability in MAU with acceleration but no declelerations,  no LOF or vaginal bleeding. Good FM.     Objective:  Blood pressure 127/87, pulse 97, temperature 98.9 F (37.2 C), temperature source Oral, resp. rate 18, last menstrual period 10/24/2017, unknown if currently breastfeeding. FHT  Baseline 150bpm, moderate variability, +accelerations, no decelerations Toco: q 2-3 mins Gen: NAD HENT: Normocephalic, atraumatic Lungs: Normal respiratory effort Heart: Regular rate noted Abdomen: NT gravid fundus, soft Cervix: Dilation: 6 Effacement (%): 70 Cervical Position: Middle Station: -2 Presentation: Vertex Exam by:: Mychael Smock BBOW noted. Patient adamantly refused AROM "I want an epidural now". Ext: 2+ DTRs, no edema, no cyanosis, negative Homan's sign  Assessment & Plan:  33 y.o. [id number] at [redacted]w[redacted]d admitted for active labor, progressing rapidly. Fentanyl 100 mcg given, labs pending, IV fluid bolus going, will get epidural soon.  Will check UDS (history of cocaine use). GBS negative, hopeful for vaginal delivery soon.   Jaynie Collins, MD, FACOG Obstetrician & Gynecologist, [facility] for [employer], [facility]

## 2018-07-20 NOTE — Progress Notes (Signed)
Called by secretary to go in and help pt. She had called out from the bathroom saying she needed help. When I entered the room patient was sitting on the toilet throwing up. Pt states that she felt a huge clot pass when she was peeing and that it made her feel nauseous. Pt states after the clot was out she felt better. She denied wanting anything for nauseous. Instructed pt not to flush toilet so RN Dava could assess the clot.

## 2018-07-20 NOTE — Consult Note (Signed)
Delivery Note:  SVD    07/20/2018  4:43 AM  I was called to the delivery room at the request of the patient's obstetrician (Dr. Anyanwu) for maternal drug use.  PRENATAL HX: This is a 32-year old she [id number] at 38 and 3/[redacted] weeks gestation who was admitted for labor and nonreassuring fetal heart tones (little variability).  Her pregnancy is complicated by cocaine and THC use with limited prenatal care.  ROM just prior to delivery, noted to have moderate meconium.  She is GBS negative  DELIVERY: NICU team arrived at around 4 minutes of age.  Per report, infant was vigorous at delivery, requiring no resuscitation other than standard warming, drying and stimulation.  APGARs 9 and 9.  Exam within normal limits.  After 5 minutes, baby left with nurse to assist parents with skin-to-skin care.   _____________________ Electronically Signed By: Karlen Barbar, MD Neonatologist  

## 2018-07-20 NOTE — H&P (Addendum)
Erika Porter is a 33 y.o. female presenting for painful contractions.  Not coping well with them.  Fetal heart rate is flat and patient has a history of cocaine and THC so we decided to admit her.    Patient Active Problem List   Diagnosis Date Noted  . Unwanted fertility 07/03/2018  . Elevated blood pressure reading without diagnosis of hypertension 07/03/2018  . Anemia in pregnancy 03/04/2018  . Abnormal TSH 02/26/2018  . Pap smear abnormality of cervix/human papillomavirus (HPV) positive 02/26/2018  . 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 Supervision of pregnancy with grand multiparity 12/05/2016  . Allergic rhinitis 11/18/2011  . TOBACCO USER 09/12/2008  . Substance abuse affecting pregnancy, antepartum 09/12/2008   . OB History    Gravida  7   Para  5   Term  5   Preterm  0   AB  1   Living  5     SAB  1   TAB  0   Ectopic  0   Multiple  0   Live Births  5          Past Medical History:  Diagnosis Date  . Anemia   . BV (bacterial vaginosis)   . Chlamydia   . Headache(784.0)   . Infection    uti  . Trichomonas    Past Surgical History:  Procedure Laterality Date  . WISDOM TOOTH EXTRACTION     Family History: family history includes Anxiety disorder in her mother; Asthma in her daughter, sister, and son; Cancer in her maternal aunt; Heart disease in her maternal grandfather; Hypertension in her mother. Social History:  reports that she has been smoking cigarettes. She has a 4.00 pack-year smoking history. She has never used smokeless tobacco. She reports previous alcohol use. She reports previous drug use. Drug: Marijuana.     Maternal Diabetes: No  Never tested. Only one prenatal visit at 36 weeks Genetic Screening: Declined Maternal Ultrasounds/Referrals: Normal Fetal Ultrasounds or other Referrals:  None Maternal Substance Abuse:  Yes:  Type: Marijuana, Cocaine Significant Maternal Medications:  None Significant Maternal Lab  Results:  Lab values include: Group B Strep negative Other Comments:  long history of cocaine and THC use, only one prenatal visit  Review of Systems  Constitutional: Negative for chills and fever.  Eyes: Negative for blurred vision.  Respiratory: Negative for shortness of breath.   Cardiovascular: Negative for leg swelling.  Gastrointestinal: Positive for abdominal pain. Negative for constipation, diarrhea, nausea and vomiting.  Genitourinary: Negative for dysuria.  Psychiatric/Behavioral: Positive for substance abuse. The patient is nervous/anxious.    Maternal Medical History:  Reason for admission: Contractions.  Nausea.  Contractions: Onset was 1-2 hours ago.   Frequency: irregular.   Perceived severity is moderate.    Fetal activity: Perceived fetal activity is normal.   Last perceived fetal movement was within the past hour.    Prenatal complications: PIH (transient during one visit) and substance abuse.   No bleeding, placental abnormality, pre-eclampsia or preterm labor.   Prenatal Complications - Diabetes: none. Never tested   Dilation: 2.5 Effacement (%): 50 Blood pressure 127/87, pulse 97, resp. rate 18, last menstrual period 10/24/2017, unknown if currently breastfeeding. Maternal Exam:  Uterine Assessment: Contraction strength is moderate.  Contraction frequency is irregular.   Abdomen: Patient reports no abdominal tenderness. Fetal presentation: vertex  Introitus: Normal vulva. Normal vagina.  Ferning test: not done.  Nitrazine test: not done.  Pelvis: adequate for delivery.  Cervix: Cervix evaluated by digital exam.     Fetal Exam Fetal Monitor Review: Mode: ultrasound.   Baseline rate: 145.  Variability: moderate (6-25 bpm).   Pattern: no accelerations and no decelerations.    Fetal State Assessment: Category II - tracings are indeterminate.     Physical Exam  Constitutional: She is oriented to person, place, and time. She appears  well-developed and well-nourished. No distress.  Agitated at times  HENT:  Head: Normocephalic.  Cardiovascular: Normal rate and regular rhythm.  Respiratory: Effort normal. No respiratory distress.  GI: Soft. She exhibits no distension. There is no abdominal tenderness. There is no rebound and no guarding.  Genitourinary:    Vulva normal.     Genitourinary Comments: Dilation: 2.5 Effacement (%): 50    Musculoskeletal: Normal range of motion.  Neurological: She is alert and oriented to person, place, and time.  Skin: Skin is warm and dry.  Psychiatric: She has a normal mood and affect.    Prenatal labs: ABO, Rh: B/Positive/-- (10/10 1032) Antibody: Negative (10/10 1032) Rubella: 3.46 (10/10 1032) RPR: Non Reactive (10/10 1032)  HBsAg: Negative (10/10 1032)  HIV: Non Reactive (10/10 1032)  GBS:     Assessment/Plan: Single intrauterine pregnancy at [redacted]w[redacted]d Uterine contractions, latent phase labor Nonreassuring fetal heart rate tracing Cocaine and THC abuse  Admit to Labor and Delivery per Dr Macon Large Routine orders May need to augment labor Anticipate SVD   Erika Porter 07/20/2018, 3:24 AM

## 2018-07-20 NOTE — MAU Note (Signed)
Contractions since 12am. Very uncomfortable. Deneis any vag bleeding or leaking.  Good fetal movement. 2.5cm last exam.

## 2018-07-20 NOTE — Plan of Care (Signed)
  Problem: Health Behavior/Discharge Planning: Goal: Ability to manage health-related needs will improve Outcome: Progressing   Problem: Clinical Measurements: Goal: Ability to maintain clinical measurements within normal limits will improve Outcome: Progressing Goal: Will remain free from infection Outcome: Progressing Goal: Diagnostic test results will improve Outcome: Progressing Goal: Respiratory complications will improve Outcome: Progressing Goal: Cardiovascular complication will be avoided Outcome: Progressing   Problem: Activity: Goal: Risk for activity intolerance will decrease Outcome: Progressing   Problem: Nutrition: Goal: Adequate nutrition will be maintained Outcome: Progressing   Problem: Elimination: Goal: Will not experience complications related to bowel motility Outcome: Progressing Goal: Will not experience complications related to urinary retention Outcome: Progressing   Problem: Pain Managment: Goal: General experience of comfort will improve Outcome: Progressing   Problem: Skin Integrity: Goal: Risk for impaired skin integrity will decrease Outcome: Progressing   

## 2018-07-20 NOTE — Discharge Summary (Signed)
Postpartum Discharge Summary     Patient Name: Erika Porter DOB: 1985-10-22 MRN: [id number]  Date of admission: 07/20/2018 Delivering Provider: Aviva Signs   Date of discharge: 07/22/2018  Admitting diagnosis: 37 WKS, CTX Intrauterine pregnancy: [redacted]w[redacted]d     Secondary diagnosis:  Active Problems:   Substance abuse affecting pregnancy, antepartum   Supervision of pregnancy with grand multiparity   Cocaine use complicating pregnancy   Anemia in pregnancy  Additional problems: cocaine and THC abuse Discharge diagnosis: Term Pregnancy Delivered                                                            Postpartum procedures:none Augmentation: none Complications: None  Hospital course:  Onset of Labor With Vaginal Delivery     33 y.o. yo [id number] at [redacted]w[redacted]d was admitted in Active Labor on 07/20/2018. Patient had an uncomplicated labor course as follows:  Membrane Rupture Time/Date: 4:29 AM ,07/20/2018 . Intrapartum Procedures: Episiotomy: None [1] . Lacerations:  Labial [10] . Patient had a delivery of a Viable infant. Patient had an uncomplicated postpartum course. She was seen by social work on PPD#1 because of positive cocaine and THC on admission. CPS case was open and discharge  She is ambulating, tolerating a regular diet, passing flatus, and urinating well. Patient is discharged home in stable condition on 07/22/18.  Magnesium Sulfate recieved: No BMZ received: No  Physical exam  Vitals:   07/21/18 0518 07/21/18 1450 07/21/18 2216 07/22/18 0656  BP: 104/65 106/80 (!) 130/92 115/74  Pulse: 60 91 79 (!) 59  Resp: 18  17 18   Temp: 98.5 F (36.9 C) 99.1 F (37.3 C) 98.4 F (36.9 C) 98.2 F (36.8 C)  TempSrc: Oral Oral Oral Axillary  SpO2: 100% 100% 100%    General: alert, well-appearing, NAD Lochia: appropriate Uterine Fundus: firm Incision: N/A DVT Evaluation: No significant calf/ankle edema. Labs: Lab Results  Component Value Date   WBC 8.8 07/03/2018   HGB 9.5 (L)  07/03/2018   HCT 29.8 (L) 07/03/2018   MCV 79.9 (L) 07/03/2018   PLT 248 07/03/2018   CMP Latest Ref Rng & Units 07/03/2018  Glucose 70 - 99 mg/dL 82  BUN 6 - 20 mg/dL 8  Creatinine 7.86 - 7.54 mg/dL 4.92  Sodium 010 - 071 mmol/L 134(L)  Potassium 3.5 - 5.1 mmol/L 3.6  Chloride 98 - 111 mmol/L 105  CO2 22 - 32 mmol/L 21(L)  Calcium 8.9 - 10.3 mg/dL 2.1(F)  Total Protein 6.5 - 8.1 g/dL 7.0  Total Bilirubin 0.3 - 1.2 mg/dL 0.7  Alkaline Phos 38 - 126 U/L 108  AST 15 - 41 U/L 22  ALT 0 - 44 U/L 13    Discharge instruction: per After Visit Summary and "Baby and Me Booklet".  After visit meds:  Allergies as of 07/22/2018   No Known Allergies     Medication List    TAKE these medications   ferrous sulfate 325 (65 FE) MG tablet Take 1 tablet (325 mg total) by mouth daily with breakfast.   ibuprofen 800 MG tablet Commonly known as:  ADVIL,MOTRIN Take 1 tablet (800 mg total) by mouth 3 (three) times daily.   Prenatal Adult Gummy/DHA/FA 0.4-25 MG Chew Chew 1 tablet by mouth daily.   senna-docusate 8.6-50 MG tablet Commonly  known as:  Senokot-S Take 2 tablets by mouth daily. Start taking on:  July 23, 2018       Diet: routine diet Activity: Advance as tolerated. Pelvic rest for 6 weeks.   Outpatient follow up:4 weeks Follow up Appt: Future Appointments  Date Time Provider Department Center  08/17/2018 10:35 AM Judeth Horn, NP [facility]   Follow up Visit: Follow-up Information    Center for [facility]. Schedule an appointment as soon as possible for a visit.   Specialty:  Obstetrics and Gynecology Why:  You should receive a call to schedule a postpartum visit. If you do not, please make a follow-up visit in 4-6 weeks.  Contact information: [address] [place] [address] [phone]         Newborn Data: Live born female  Birth Weight: 6 lb 11.9 oz (3059 g) APGAR: 9, 9  Newborn Delivery   Birth date/time:  07/20/2018  04:30:00 Delivery type:  Vaginal, Spontaneous    Baby Feeding: Bottle Disposition:to be determined - CPS involvement at time of discharge  07/22/2018 Tamera Stands, DO

## 2018-07-21 ENCOUNTER — Encounter: Payer: Self-pay | Admitting: Advanced Practice Midwife

## 2018-07-21 DIAGNOSIS — O411290 Chorioamnionitis, unspecified trimester, not applicable or unspecified: Secondary | ICD-10-CM | POA: Insufficient documentation

## 2018-07-21 HISTORY — DX: Chorioamnionitis, unspecified trimester, not applicable or unspecified: O41.1290

## 2018-07-21 NOTE — Clinical Social Work Maternal (Signed)
CLINICAL SOCIAL WORK MATERNAL/CHILD NOTE  Patient Details  Name: Erika Porter MRN: [id number] Date of Birth: [birth year]  Date:  07/21/2018  Clinical Social Worker Initiating Note:  Erika Porter, [place] Date/Time: Initiated:  07/21/18/1205     Child's Name:  Erika Porter   Biological Parents:  Mother, Father(Father: Erika Porter)   Need for Interpreter:  None   Reason for Referral:  Current Substance Use/Substance Use During Pregnancy , Late or No Prenatal Care    Address:  [address] [place] [address]    Phone number:  [phone] (home)     Additional phone number:   Household Members/Support Persons (HM/SP):   Household Member/Support Person 1, Household Member/Support Person 2, Household Member/Support Person 3, Household Member/Support Person 4, Household Member/Support Person 5, Household Member/Support Person 6   HM/SP Name Relationship DOB or Age  HM/SP -1 Erika Porter FOB    HM/SP -2 Erika Porter son 11/01/00  HM/SP -3 Erika Porter son 01/03/04  HM/SP -4 Erika Porter  daughter 04/04/09  HM/SP -5 Erika Porter son 07/16/13  HM/SP -6 Erika Porter son 05/16/17  HM/SP -7        HM/SP -8          Natural Supports (not living in the home):  Parent, Immediate Family   Professional Supports: None   Employment: Unemployed   Type of Work:     Education:  9 to 11 years(9th Grade)   Homebound arranged: No  Financial Resources:  [employer]   Other Resources:  [employer], Physicist, medical    Cultural/Religious Considerations Which May Impact Care:    Strengths:  Ability to meet basic needs , Pediatrician chosen   Psychotropic Medications:         Pediatrician:    [place]  Pediatrician List:   [place]  [place]      Pediatrician Fax Number:    Risk Factors/Current Problems:  Substance Use    Cognitive  State:  Able to Concentrate , Alert , Linear Thinking , Goal Oriented    Mood/Affect:  Happy , Interested , Comfortable , Relaxed    CSW Assessment: CSW met with MOB at bedside to discuss consult , MOB's friend present. MOB granted CSW verbal permission to speak in front of her friend about everything. CSW introduced self and explained reason for consult. MOB was pleasant, open and engaged during assessment. MOB reported that she resides with her FOB and kids. MOB reported that she is currently unemployed and receives both [facility] and food stamps. MOB reported that she doesn't have any items for the baby but FOB is going to get everything for the baby. CSW inquired about MOB's support system, MOB reported that her mother and sister were her supports.   CSW inquired about MOB's mental health history, MOB denied any mental health history. MOB denied postpartum depression history with older children. MOB presented pleasant and open during assessment. MOB did not demonstrate any acute mental health signs/symptoms. CSW assessed for safety, MOB denied SI, HI and domestic violence.   CSW provided education regarding the baby blues period vs. perinatal mood disorders, discussed treatment and gave resources for mental health follow up if concerns arise.  CSW recommends self-evaluation during the postpartum time period using the New Mom Checklist from Postpartum Progress and encouraged MOB to contact a medical professional if symptoms are  noted at any time.    CSW provided review of Sudden Infant Death Syndrome (SIDS) precautions.  MOB reported that she co-sleeps with her children. CSW informed MOB that co-sleeping is not safe nor appropriate. MOB reported that she has a co-sleeper that infant sleeps in, in her bed. CSW informed MOB to always place infant in a safe sleeping area, MOB agreed to use co-sleeper.   CSW and MOB discussed MOB's substance use during pregnancy. MOB reported that she used cocaine and  marijuana during pregnancy. MOB denied any other substance use. CSW informed MOB that infant's UDS was positive for cocaine and marijuana and a CPS report would be made. MOB verbalized understanding and reported that her last open case was in 2018 when she had her last baby. CSW asked MOB if she was interested in substance abuse treatment resources, MOB declined and reported that it would take time away from her children. MOB reported that CPS was going to make her take classes.   CSW made a CPS report to [facility]. CSW awaiting contact from CPS with infant's discharge plan. Currently there are barriers to infants discharge.   CSW Plan/Description:  Sudden Infant Death Syndrome (SIDS) Education, Perinatal Mood and Anxiety Disorder (PMADs) Education, Hospital Drug Screen Policy Information, Child Protective Service Report , CSW Awaiting CPS Disposition Plan, CSW Will Continue to Monitor Umbilical Cord Tissue Drug Screen Results and Make Report if Barbette Or, LCSW 07/21/2018, 12:10 PM

## 2018-07-21 NOTE — Progress Notes (Signed)
Post Partum Day 1 Subjective: Patient is recovering well following delivery.  She denies pain, and notes that lochia is improving.  She is ambulating well, and denies edema, shortness of breath or chest pain.  She is tolerating PO and denies nausea or vomiting.    Objective: Blood pressure 104/65, pulse 60, temperature 98.5 F (36.9 C), temperature source Oral, resp. rate 18, last menstrual period 10/24/2017, SpO2 100 %, unknown if currently breastfeeding.  Physical Exam:  General: alert, cooperative and appears stated age Lochia: appropriate Uterine Fundus: firm DVT Evaluation: No evidence of DVT seen on physical exam. Negative Homan's sign. No cords or calf tenderness. No significant calf/ankle edema.  No results for input(s): HGB, HCT in the last 72 hours.  Assessment/Plan: Postpartum day 1  Social work consult d/t cocaine use Continue current care, consider discharge   LOS: 1 day   Francene Boyers 07/21/2018, 9:11 AM

## 2018-07-22 MED ORDER — IBUPROFEN 800 MG PO TABS: 800.0000 mg | ORAL_TABLET | Freq: Three times a day (TID) | ORAL | 0 refills | Status: DC

## 2018-07-22 MED ORDER — SENNOSIDES-DOCUSATE SODIUM 8.6-50 MG PO TABS: 2.0000 | ORAL_TABLET | ORAL | 0 refills | Status: DC

## 2018-07-22 NOTE — Progress Notes (Signed)
CSW contacted by [facility] CPS worker Kallie Locks [phone]) and informed that the infant's discharge plan is to discharge to FOB [facility].   CSW updated MOB's RN.  Infant is to discharge to FOB [facility].  Celso Sickle, LCSW Clinical Social Worker [facility] Cell#: [phone]

## 2018-07-22 NOTE — Progress Notes (Signed)
CSW followed up with [place] [employer] CPS worker (Jerin Elliott [phone]) regarding infant's discharge plan. CPS worker reported that infant's discharge plan is not decided at this time and agreed to contact CSW with an update.  CSW awaiting return call from CPS with infant's discharge plan.  Aubree Doody, LCSW Clinical Social Worker [facility] Cell#: [phone] 

## 2018-08-11 ENCOUNTER — Encounter (HOSPITAL_COMMUNITY): Payer: Self-pay | Admitting: Emergency Medicine

## 2018-08-11 ENCOUNTER — Emergency Department (HOSPITAL_COMMUNITY)
Admission: EM | Admit: 2018-08-11 | Discharge: 2018-08-11 | Disposition: A | Discharge status: Home / Self Care | Payer: Medicaid Other | Attending: Emergency Medicine | Admitting: Emergency Medicine

## 2018-08-11 ENCOUNTER — Other Ambulatory Visit: Payer: Self-pay

## 2018-08-11 DIAGNOSIS — N898 Other specified noninflammatory disorders of vagina: Secondary | ICD-10-CM | POA: Insufficient documentation

## 2018-08-11 DIAGNOSIS — F1290 Cannabis use, unspecified, uncomplicated: Secondary | ICD-10-CM | POA: Insufficient documentation

## 2018-08-11 DIAGNOSIS — Z79899 Other long term (current) drug therapy: Secondary | ICD-10-CM | POA: Diagnosis not present

## 2018-08-11 DIAGNOSIS — R102 Pelvic and perineal pain: Secondary | ICD-10-CM | POA: Insufficient documentation

## 2018-08-11 DIAGNOSIS — F17210 Nicotine dependence, cigarettes, uncomplicated: Secondary | ICD-10-CM | POA: Insufficient documentation

## 2018-08-11 LAB — URINALYSIS, ROUTINE W REFLEX MICROSCOPIC
Bilirubin Urine: NEGATIVE
Glucose, UA: NEGATIVE mg/dL
Hgb urine dipstick: NEGATIVE
Ketones, ur: NEGATIVE mg/dL
Leukocytes,Ua: NEGATIVE
Nitrite: NEGATIVE
Protein, ur: NEGATIVE mg/dL
Specific Gravity, Urine: 1.025 (ref 1.005–1.030)
pH: 6 (ref 5.0–8.0)

## 2018-08-11 LAB — WET PREP, GENITAL
Sperm: NONE SEEN
Trich, Wet Prep: NONE SEEN
Yeast Wet Prep HPF POC: NONE SEEN

## 2018-08-11 MED ORDER — METRONIDAZOLE 500 MG PO TABS: 500.0000 mg | ORAL_TABLET | Freq: Two times a day (BID) | ORAL | 0 refills | Status: DC

## 2018-08-11 MED ORDER — CEFTRIAXONE SODIUM 250 MG IJ SOLR
250.0000 mg | Freq: Once | INTRAMUSCULAR | Status: AC
  Administered 2018-08-11: 250 mg via INTRAMUSCULAR
  Filled 2018-08-11: qty 250

## 2018-08-11 MED ORDER — STERILE WATER FOR INJECTION IJ SOLN
INTRAMUSCULAR | Status: AC
  Administered 2018-08-11: 10 mL
  Filled 2018-08-11: qty 10

## 2018-08-11 MED ORDER — DOXYCYCLINE HYCLATE 100 MG PO CAPS: 100.0000 mg | ORAL_CAPSULE | Freq: Two times a day (BID) | ORAL | 0 refills | Status: DC

## 2018-08-11 NOTE — Discharge Instructions (Addendum)
Please read attached information. If you experience any new or worsening signs or symptoms please return to the emergency room for evaluation. Please follow-up with your primary care provider or specialist as discussed. Please use medication prescribed only as directed and discontinue taking if you have any concerning signs or symptoms.   °

## 2018-08-11 NOTE — ED Provider Notes (Signed)
I assumed care of patient from Burna Forts PA-C at shift change, please see his note for full H&P. Patient had a baby on March 2 and is still bleeding from this, she has reportedly had vaginal discharge.   Physical Exam  BP (!) 131/92 (BP Location: Left Arm)   Pulse 82   Temp 98.9 F (37.2 C) (Oral)   Resp 17   Ht 5\' 7"  (1.702 m)   Wt 61.2 kg   LMP 10/24/2017 (Within Days)   SpO2 100%   Breastfeeding No   BMI 21.14 kg/m   Physical Exam  ED Course/Procedures     Procedures  No results found. Labs Reviewed  WET PREP, GENITAL - Abnormal; Notable for the following components:      Result Value   Clue Cells Wet Prep HPF POC PRESENT (*)    WBC, Wet Prep HPF POC FEW (*)    All other components within normal limits  URINALYSIS, ROUTINE W REFLEX MICROSCOPIC  POC URINE PREG, ED  GC/CHLAMYDIA PROBE AMP ([place]) NOT AT [facility]     MDM  Reportedly purulent discharge coming from the cervix on physical exam. She is being empirically treated for gonorrhea and chlamydia and prescription for doxycycline at home.  Plan is to follow-up on UA, if evidence of infection give Rx for additional antibiotics.   UA negative.  Per plan established by previous team she is discharged.  She was informed of her results by Burna Forts.         Cristina Gong, PA-C 08/11/18 1602    Gwyneth Sprout, MD 08/12/18 2056

## 2018-08-11 NOTE — ED Triage Notes (Signed)
Reports pelvic pain that began today.  Denies vaginal discharge but endorses vaginal bleeding (states recently had a baby on March 2 and is still bleeding from this).  Patient had vaginal delivery.  Denies follow up visit for obgyn.

## 2018-08-11 NOTE — ED Provider Notes (Signed)
[facility] EMERGENCY DEPARTMENT Provider Note   CSN: [id number] Arrival date & time: 08/11/18  1431    History   Chief Complaint Chief Complaint  Patient presents with  . Pelvic Pain    HPI Erika Porter is a 33 y.o. female.     HPI   33 year old G7, P5   Female presents today with complaints of pelvic pressure with urination.  Patient notes she was in her usual state of health this morning.  She woke up and had pressure in her pelvis and suprapubic region with urination.  She notes the pain is continue to persist.  She does note that she is having vaginal discharge, white in nature that has been persistent since vaginal delivery on March 2.  She denies any urine urgency, change in color, odor.  She notes she is sexually active without protection.  She is not on birth control.  She denies any fever or upper abdominal pain.  Past Medical History:  Diagnosis Date  . Allergic rhinitis 11/18/2011  . Anemia   . BV (bacterial vaginosis)   . Chlamydia   . Headache(784.0)   . Infection    uti  . Trichomonas     Patient Active Problem List   Diagnosis Date Noted  . 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 Elevated blood pressure reading without diagnosis of hypertension 07/03/2018  . Anemia in pregnancy 03/04/2018  . Abnormal TSH 02/26/2018  . Pap smear abnormality of cervix/human papillomavirus (HPV) positive 02/26/2018  . 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 Supervision of pregnancy with grand multiparity 12/05/2016  . TOBACCO USER 09/12/2008  . Substance abuse affecting pregnancy, antepartum 09/12/2008    Past Surgical History:  Procedure Laterality Date  . WISDOM TOOTH EXTRACTION       OB History    Gravida  7   Para  6   Term  6   Preterm  0   AB  1   Living  6     SAB  1   TAB  0   Ectopic  0   Multiple  0   Live Births  6            Home Medications    Prior to Admission medications   Medication Sig Start Date  End Date Taking? Authorizing Provider  Prenatal MV & Min w/FA-DHA (PRENATAL ADULT GUMMY/DHA/FA) 0.4-25 MG CHEW Chew 1 tablet by mouth daily. 12/08/17  Yes Leland Her, DO  doxycycline (VIBRAMYCIN) 100 MG capsule Take 1 capsule (100 mg total) by mouth 2 (two) times daily. 08/11/18   Khalin Royce, Tinnie Gens, PA-C  ferrous sulfate 325 (65 FE) MG tablet Take 1 tablet (325 mg total) by mouth daily with breakfast. Patient not taking: Reported on 08/11/2018 03/04/18   Westley Chandler, MD  ibuprofen (ADVIL,MOTRIN) 800 MG tablet Take 1 tablet (800 mg total) by mouth 3 (three) times daily. Patient not taking: Reported on 08/11/2018 07/22/18   Tamera Stands, DO  metroNIDAZOLE (FLAGYL) 500 MG tablet Take 1 tablet (500 mg total) by mouth 2 (two) times daily. 08/11/18   Eyvonne Mechanic, PA-C    Family History Family History  Problem Relation Age of Onset  . Hypertension Mother   . Anxiety disorder Mother   . Asthma Sister   . Asthma Daughter   . Asthma Son   . Cancer Maternal Aunt   . Heart disease Maternal Grandfather    Social History Social History   Tobacco Use  . Smoking  status: Current Every Day Smoker    Packs/day: 1.00    Years: 4.00    Pack years: 4.00    Types: Cigarettes  . Smokeless tobacco: Never Used  . Tobacco comment: quit with preg  Substance Use Topics  . Alcohol use: Not Currently    Alcohol/week: 0.0 standard drinks    Frequency: Never    Comment: not while preg  . Drug use: Not Currently    Types: Marijuana    Comment: Reports she cannot eat without marijuana      Allergies   Patient has no known allergies.   Review of Systems Review of Systems  All other systems reviewed and are negative.  Physical Exam Updated Vital Signs BP (!) 131/92 (BP Location: Left Arm)   Pulse 82   Temp 98.9 F (37.2 C) (Oral)   Resp 17   Ht 5\' 7"  (1.702 m)   Wt 61.2 kg   LMP 10/24/2017 (Within Days)   SpO2 100%   Breastfeeding No   BMI 21.14 kg/m   Physical Exam Vitals signs  and nursing note reviewed.  Constitutional:      Appearance: She is well-developed.  HENT:     Head: Normocephalic and atraumatic.  Eyes:     General: No scleral icterus.       Right eye: No discharge.        Left eye: No discharge.     Conjunctiva/sclera: Conjunctivae normal.     Pupils: Pupils are equal, round, and reactive to light.  Neck:     Musculoskeletal: Normal range of motion.     Vascular: No JVD.     Trachea: No tracheal deviation.  Pulmonary:     Effort: Pulmonary effort is normal.     Breath sounds: No stridor.  Abdominal:     Comments: Tenderness to suprapubic region, remainder abdomen soft nontender  Genitourinary:    Comments: Purulent discharge noted in vaginal vault, minor cervical motion tenderness, nonsevere-no bleeding Neurological:     Mental Status: She is alert and oriented to person, place, and time.     Coordination: Coordination normal.  Psychiatric:        Behavior: Behavior normal.        Thought Content: Thought content normal.        Judgment: Judgment normal.      ED Treatments / Results  Labs (all labs ordered are listed, but only abnormal results are displayed) Labs Reviewed  WET PREP, GENITAL - Abnormal; Notable for the following components:      Result Value   Clue Cells Wet Prep HPF POC PRESENT (*)    WBC, Wet Prep HPF POC FEW (*)    All other components within normal limits  URINALYSIS, ROUTINE W REFLEX MICROSCOPIC  POC URINE PREG, ED  GC/CHLAMYDIA PROBE AMP ([place]) NOT AT [facility]    EKG None  Radiology No results found.  Procedures Procedures (including critical care time)  Medications Ordered in ED Medications  cefTRIAXone (ROCEPHIN) injection 250 mg (250 mg Intramuscular Given 08/11/18 1543)  sterile water (preservative free) injection (10 mLs  Given 08/11/18 1546)     Initial Impression / Assessment and Plan / ED Course  I have reviewed the triage vital signs and the nursing notes.  Pertinent labs & imaging  results that were available during my care of the patient were reviewed by me and considered in my medical decision making (see chart for details).        Assessment/Plan: 33 year old  female presents today with pelvic pain.  Patient has what appears to be purulent discharge on her exam.  Patient notes this is baseline.  She is sexually active, discussed prophylactic treatment, she would like medications at this time.  She was given ceftriaxone azithromycin.  Urine without signs of infection, patient discharged home on doxycycline given minor cervical motion tenderness.  She has no signs of systemic illness or disease.  She is encouraged to follow-up with her OB/GYN culture return precautions given.  Verbalized understanding.   Final Clinical Impressions(s) / ED Diagnoses   Final diagnoses:  Pelvic pain  Vaginal pain    ED Discharge Orders         Ordered    doxycycline (VIBRAMYCIN) 100 MG capsule  2 times daily     08/11/18 1536    metroNIDAZOLE (FLAGYL) 500 MG tablet  2 times daily     08/11/18 1555           Eyvonne Mechanic, Cordelia Poche 08/12/18 1422    Gwyneth Sprout, MD 08/12/18 2057

## 2018-08-12 LAB — GC/CHLAMYDIA PROBE AMP (~~LOC~~) NOT AT ARMC
Chlamydia: NEGATIVE
Neisseria Gonorrhea: NEGATIVE

## 2018-08-14 ENCOUNTER — Telehealth: Payer: Self-pay | Admitting: Student

## 2018-08-14 NOTE — Telephone Encounter (Signed)
Called patient about not needing to come into office for her postpartum visit, but possibly getting a phone call.

## 2018-08-17 ENCOUNTER — Ambulatory Visit: Payer: Self-pay | Admitting: Student

## 2018-10-14 ENCOUNTER — Emergency Department (HOSPITAL_COMMUNITY)
Admission: EM | Admit: 2018-10-14 | Discharge: 2018-10-14 | Disposition: A | Discharge status: Home / Self Care | Payer: Medicaid Other | Attending: Emergency Medicine | Admitting: Emergency Medicine

## 2018-10-14 ENCOUNTER — Other Ambulatory Visit: Payer: Self-pay

## 2018-10-14 DIAGNOSIS — A599 Trichomoniasis, unspecified: Secondary | ICD-10-CM | POA: Diagnosis not present

## 2018-10-14 DIAGNOSIS — N899 Noninflammatory disorder of vagina, unspecified: Secondary | ICD-10-CM | POA: Diagnosis present

## 2018-10-14 DIAGNOSIS — F17210 Nicotine dependence, cigarettes, uncomplicated: Secondary | ICD-10-CM | POA: Insufficient documentation

## 2018-10-14 DIAGNOSIS — F1290 Cannabis use, unspecified, uncomplicated: Secondary | ICD-10-CM | POA: Insufficient documentation

## 2018-10-14 DIAGNOSIS — B9689 Other specified bacterial agents as the cause of diseases classified elsewhere: Secondary | ICD-10-CM | POA: Diagnosis not present

## 2018-10-14 DIAGNOSIS — N760 Acute vaginitis: Secondary | ICD-10-CM | POA: Diagnosis not present

## 2018-10-14 LAB — WET PREP, GENITAL
Sperm: NONE SEEN
Yeast Wet Prep HPF POC: NONE SEEN

## 2018-10-14 LAB — URINALYSIS, ROUTINE W REFLEX MICROSCOPIC
Bilirubin Urine: NEGATIVE
Glucose, UA: NEGATIVE mg/dL
Ketones, ur: NEGATIVE mg/dL
Nitrite: NEGATIVE
Protein, ur: NEGATIVE mg/dL
Specific Gravity, Urine: 1.018 (ref 1.005–1.030)
pH: 6 (ref 5.0–8.0)

## 2018-10-14 LAB — POC URINE PREG, ED: Preg Test, Ur: NEGATIVE

## 2018-10-14 MED ORDER — METRONIDAZOLE 500 MG PO TABS: 500.0000 mg | ORAL_TABLET | Freq: Two times a day (BID) | ORAL | 0 refills | Status: DC

## 2018-10-14 MED ORDER — METRONIDAZOLE 500 MG PO TABS
2000.0000 mg | ORAL_TABLET | Freq: Once | ORAL | Status: AC
  Administered 2018-10-14: 12:00:00 2000 mg via ORAL
  Filled 2018-10-14: qty 4

## 2018-10-14 MED ORDER — CEFTRIAXONE SODIUM 250 MG IJ SOLR
250.0000 mg | Freq: Once | INTRAMUSCULAR | Status: AC
  Administered 2018-10-14: 12:00:00 250 mg via INTRAMUSCULAR
  Filled 2018-10-14: qty 250

## 2018-10-14 MED ORDER — AZITHROMYCIN 250 MG PO TABS
1000.0000 mg | ORAL_TABLET | Freq: Once | ORAL | Status: AC
  Administered 2018-10-14: 1000 mg via ORAL
  Filled 2018-10-14: qty 4

## 2018-10-14 NOTE — ED Notes (Signed)
Got patient into a gown on the monitor patient is now walking to the bathroom

## 2018-10-14 NOTE — ED Triage Notes (Signed)
Pt in with c/o "vaginal irritation" and discharge. Pt states she finished abx course back in April, is having new white discharge now. Had baby recently, denies any abdominal pain or bleeding

## 2018-10-14 NOTE — ED Provider Notes (Signed)
[facility] EMERGENCY DEPARTMENT Provider Note   CSN: [id number] Arrival date & time: 10/14/18  4098    History   Chief Complaint Chief Complaint  Patient presents with  . Vaginal Discharge    HPI Erika Porter is a 33 y.o. female.     The history is provided by the patient. No language interpreter was used.  Vaginal Discharge     34 year old female who recently had a baby presenting complaining of vaginal irritation.  Patient report for the past 3 days she noticed discomfort about her vagina,, itchy, and having discharge.  She does not complain of any fever, dysuria, vaginal bleeding, abdominal pain, bowel bladder changes.  No new sexual partners.  She reports she was diagnosed with bacterial vaginal infection several months prior was given antibiotic, and took that for 2 days but lost her medication.  Her symptoms did improve but now it returns.  She is 3 months postpartum.  She does admits to changing her soap on a regular basis which is not new.  Past Medical History:  Diagnosis Date  . Allergic rhinitis 11/18/2011  . Anemia   . BV (bacterial vaginosis)   . Chlamydia   . Headache(784.0)   . Infection    uti  . Trichomonas     Patient Active Problem List   Diagnosis Date Noted  . 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 Elevated blood pressure reading without diagnosis of hypertension 07/03/2018  . Anemia in pregnancy 03/04/2018  . Abnormal TSH 02/26/2018  . Pap smear abnormality of cervix/human papillomavirus (HPV) positive 02/26/2018  . 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 Supervision of pregnancy with grand multiparity 12/05/2016  . TOBACCO USER 09/12/2008  . Substance abuse affecting pregnancy, antepartum 09/12/2008    Past Surgical History:  Procedure Laterality Date  . WISDOM TOOTH EXTRACTION       OB History    Gravida  7   Para  6   Term  6   Preterm  0   AB  1   Living  6     SAB  1   TAB  0   Ectopic  0   Multiple  0   Live Births  6            Home Medications    Prior to Admission medications   Medication Sig Start Date End Date Taking? Authorizing Provider  doxycycline (VIBRAMYCIN) 100 MG capsule Take 1 capsule (100 mg total) by mouth 2 (two) times daily. 08/11/18   Hedges, Tinnie Gens, PA-C  ferrous sulfate 325 (65 FE) MG tablet Take 1 tablet (325 mg total) by mouth daily with breakfast. Patient not taking: Reported on 08/11/2018 03/04/18   Westley Chandler, MD  ibuprofen (ADVIL,MOTRIN) 800 MG tablet Take 1 tablet (800 mg total) by mouth 3 (three) times daily. Patient not taking: Reported on 08/11/2018 07/22/18   Tamera Stands, DO  metroNIDAZOLE (FLAGYL) 500 MG tablet Take 1 tablet (500 mg total) by mouth 2 (two) times daily. 08/11/18   Eyvonne Mechanic, PA-C  Prenatal MV & Min w/FA-DHA (PRENATAL ADULT GUMMY/DHA/FA) 0.4-25 MG CHEW Chew 1 tablet by mouth daily. 12/08/17   Leland Her, DO    Family History Family History  Problem Relation Age of Onset  . Hypertension Mother   . Anxiety disorder Mother   . Asthma Sister   . Asthma Daughter   . Asthma Son   . Cancer Maternal Aunt   . Heart disease Maternal Grandfather  Social History Social History   Tobacco Use  . Smoking status: Current Every Day Smoker    Packs/day: 1.00    Years: 4.00    Pack years: 4.00    Types: Cigarettes  . Smokeless tobacco: Never Used  . Tobacco comment: quit with preg  Substance Use Topics  . Alcohol use: Not Currently    Alcohol/week: 0.0 standard drinks    Frequency: Never    Comment: not while preg  . Drug use: Not Currently    Types: Marijuana    Comment: Reports she cannot eat without marijuana      Allergies   Patient has no known allergies.   Review of Systems Review of Systems  Genitourinary: Positive for vaginal discharge.  All other systems reviewed and are negative.    Physical Exam Updated Vital Signs BP (!) 149/95 (BP Location: Right Arm)   Pulse 77   Temp 98.5  F (36.9 C) (Oral)   Resp 20   SpO2 98%   Physical Exam Vitals signs and nursing note reviewed.  Constitutional:      General: She is not in acute distress.    Appearance: She is well-developed.  HENT:     Head: Atraumatic.  Eyes:     Conjunctiva/sclera: Conjunctivae normal.  Neck:     Musculoskeletal: Neck supple.  Cardiovascular:     Rate and Rhythm: Normal rate and regular rhythm.     Pulses: Normal pulses.     Heart sounds: Normal heart sounds.  Pulmonary:     Effort: Pulmonary effort is normal.     Breath sounds: Normal breath sounds.  Abdominal:     Palpations: Abdomen is soft.     Tenderness: There is no abdominal tenderness.  Genitourinary:    Comments: Chaperone present during exam.  No inguinal lymphadenopathy or inguinal hernia noted.  Normal external genitalia.  No discomfort with speculum insertion.  Moderate amount of thin dark discharge noted in vaginal vault.  Closed cervical os free of lesion or rash.  On bimanual examination no adnexal tenderness, or cervical motion tenderness Skin:    Findings: No rash.  Neurological:     Mental Status: She is alert.      ED Treatments / Results  Labs (all labs ordered are listed, but only abnormal results are displayed) Labs Reviewed  WET PREP, GENITAL - Abnormal; Notable for the following components:      Result Value   Trich, Wet Prep PRESENT (*)    Clue Cells Wet Prep HPF POC PRESENT (*)    WBC, Wet Prep HPF POC MANY (*)    All other components within normal limits  RPR  HIV ANTIBODY (ROUTINE TESTING W REFLEX)  URINALYSIS, ROUTINE W REFLEX MICROSCOPIC  POC URINE PREG, ED  GC/CHLAMYDIA PROBE AMP ([place]) NOT [facility]    EKG None  Radiology No results found.  Procedures Procedures (including critical care time)  Medications Ordered in ED Medications - No data to display   Initial Impression / Assessment and Plan / ED Course  I have reviewed the triage vital signs and the nursing notes.   Pertinent labs & imaging results that were available during my care of the patient were reviewed by me and considered in my medical decision making (see chart for details).        BP (!) 149/95 (BP Location: Right Arm)   Pulse 77   Temp 98.5 F (36.9 C) (Oral)   Resp 20   SpO2 98%  Final Clinical Impressions(s) / ED Diagnoses   Final diagnoses:  Trichomonal infection  BV (bacterial vaginosis)    ED Discharge Orders         Ordered    metroNIDAZOLE (FLAGYL) 500 MG tablet  2 times daily     10/14/18 1124         9:36 AM Patient here with vaginal discomfort and itchiness suggestive of potential BV versus yeast infection.  Work-up initiated.  11:16 AM Patient does not have any subsequent discomfort on proper examination.  She does have some thin vaginal discharge.  Her wet prep is positive for trichomonas which she will be treated with Flagyl 2 g.  Evidence of clue cells and many WBC.  She will go home with Flagyl as treatment for BV.  Since no significant discomfort, I did offer her prophylactic antibiotic which includes Rocephin and Zithromax here to cover for potential gonorrhea and chlamydia. UA result have not populated yet, pt denies dysuria.    Fayrene Helper, PA-C 10/14/18 1125    Geoffery Lyons, MD 10/14/18 1447

## 2018-10-15 LAB — HIV ANTIBODY (ROUTINE TESTING W REFLEX): HIV Screen 4th Generation wRfx: NONREACTIVE

## 2018-10-15 LAB — GC/CHLAMYDIA PROBE AMP (~~LOC~~) NOT AT ARMC
Chlamydia: NEGATIVE
Neisseria Gonorrhea: NEGATIVE

## 2018-10-16 LAB — RPR: RPR Ser Ql: NONREACTIVE

## 2018-11-24 ENCOUNTER — Encounter: Payer: Self-pay | Admitting: *Deleted

## 2019-03-02 ENCOUNTER — Other Ambulatory Visit: Payer: Self-pay

## 2019-03-02 ENCOUNTER — Encounter: Payer: Self-pay | Admitting: Family Medicine

## 2019-03-02 ENCOUNTER — Ambulatory Visit (INDEPENDENT_AMBULATORY_CARE_PROVIDER_SITE_OTHER): Payer: Medicaid Other | Admitting: Family Medicine

## 2019-03-02 VITALS — BP 99/58 | HR 63 | Temp 98.4°F | Wt 145.0 lb

## 2019-03-02 DIAGNOSIS — F1490 Cocaine use, unspecified, uncomplicated: Secondary | ICD-10-CM | POA: Diagnosis not present

## 2019-03-02 DIAGNOSIS — Z3A01 Less than 8 weeks gestation of pregnancy: Secondary | ICD-10-CM

## 2019-03-02 DIAGNOSIS — N912 Amenorrhea, unspecified: Secondary | ICD-10-CM | POA: Diagnosis not present

## 2019-03-02 DIAGNOSIS — O0940 Supervision of pregnancy with grand multiparity, unspecified trimester: Secondary | ICD-10-CM | POA: Diagnosis not present

## 2019-03-02 DIAGNOSIS — O99320 Drug use complicating pregnancy, unspecified trimester: Secondary | ICD-10-CM

## 2019-03-02 DIAGNOSIS — Z3490 Encounter for supervision of normal pregnancy, unspecified, unspecified trimester: Secondary | ICD-10-CM | POA: Diagnosis not present

## 2019-03-02 LAB — POCT URINE DRUG SCREEN
Buprenorphine, Urine: NOT DETECTED
POC Amphetamine UR: NOT DETECTED
POC BENZODIAZEPINES UR: NOT DETECTED
POC Barbiturate UR: NOT DETECTED
POC Cocaine UR: POSITIVE — AB
POC Ecstasy UR: NOT DETECTED
POC Marijuana UR: POSITIVE — AB
POC Methadone UR: NOT DETECTED
POC Methamphetamine UR: NOT DETECTED
POC Opiate Ur: NOT DETECTED
POC Oxycodone UR: NOT DETECTED
POC PHENCYCLIDINE UR: NOT DETECTED

## 2019-03-02 LAB — POCT URINE PREGNANCY: Preg Test, Ur: POSITIVE — AB

## 2019-03-02 MED ORDER — PRENATAL ADULT GUMMY/DHA/FA 0.4-25 MG PO CHEW: 1.0000 | CHEWABLE_TABLET | Freq: Every day | ORAL | 9 refills | Status: AC

## 2019-03-02 NOTE — Assessment & Plan Note (Signed)
POC UDS +THC and cocaine today.

## 2019-03-02 NOTE — Assessment & Plan Note (Signed)
POC UDS positive for cocaine and THC today.

## 2019-03-02 NOTE — Patient Instructions (Addendum)
It was great to see you!  Our plans for today:  - We are scheduling an anatomy ultrasound. This can help [place] determine how far along you are.  - We sent the prenatal vitamins to your drug store. - It is VERY IMPORTANT for you to STOP smoking for the health of you and your baby. This can cause an abruption of your placenta and cause it to separate from your uterus causing death to your baby. - Go to your ultrasound as scheduled.  - Follow up next week after your ultrasound for an initial OB appointment. - Consider birth control after the birth of your baby to prevent future unwanted pregnancies. You could also consider getting your tubes tied.   We are checking some labs today, we will call you or send you a letter if they are abnormal.   Take care and seek immediate care sooner if you develop any concerns.   Dr. [facility]

## 2019-03-02 NOTE — Assessment & Plan Note (Addendum)
Unsure of dating. Last UPreg 10/14/18 negative. Bedside POCUS done today with visualized fetal heart beat. Doppler FHT 135 today. Reviewed high risk referral criteria, currently substance use not criteria, can plan to follow in [facility] clinic unless issues arise especially given patient did not initiate care with [facility] until 36wks at last referral in last pregnancy. Call made to CPS case worker, Pam at [phone] regarding +UDS in clinic today and missed appointments for 7mo child of whom baby was d/ced to custody of father but was reportedly living with both mother and father at last visit. Obtained initial OB labs today. Made appointment for anatomy scan. Follow up in clinic after obtaining anatomy scan.

## 2019-03-02 NOTE — Progress Notes (Signed)
  Subjective:   Patient ID: Erika Porter    DOB: 10-Mar-1986, 33 y.o. female   MRN: [id number]  Erika Porter is a 33 y.o. female with a history of cocaine use, tobacco use here for   Pregnancy test - LMP April or May, unknown - negative UPreg 10/14/18. - Symptoms: vomiting, belly grew, feeling baby move.  - no abnl vaginal discharge. - still smoking tobacco, MJ and using cocaine.  - UTD with pap smear 2018, negative cytology and HPV  Review of Systems:  Per HPI.  Medications and smoking status reviewed.  Objective:   BP (!) 99/58   Pulse 63   Temp 98.4 F (36.9 C) (Oral)   Wt 145 lb (65.8 kg)   LMP 09/18/2018   SpO2 93%   BMI 22.71 kg/m  Vitals and nursing note reviewed.  General: well nourished, well developed, in no acute distress with non-toxic appearance Abdomen: soft, non-tender, gravid abdomen, uterus palpated slightly above umbilicus. Doppler FHT 135 today.  MSK: ROM grossly intact, gait normal Neuro: Alert and oriented, fast speech   Assessment & Plan:   Cocaine use complicating pregnancy POC UDS positive for cocaine and THC today.  Substance abuse affecting pregnancy, antepartum POC UDS +THC and cocaine today.  Supervision of pregnancy with grand multiparity Unsure of dating. Last UPreg 10/14/18 negative. Bedside POCUS done today with visualized fetal heart beat. Doppler FHT 135 today. Reviewed high risk referral criteria, currently substance use not criteria, can plan to follow in [facility] clinic unless issues arise especially given patient did not initiate care with [facility] until 36wks at last referral in last pregnancy. Call made to CPS case worker, Pam at [phone] regarding +UDS in clinic today and missed appointments for 31mo child of whom baby was d/ced to custody of father but was reportedly living with both mother and father at last visit. Obtained initial OB labs today. Made appointment for anatomy scan. Follow up in clinic after obtaining anatomy scan.    Orders Placed This Encounter  Procedures  . Culture, OB Urine  . [place] OP OB Comp + 14 WK    Standing Status:   Future    Standing Expiration Date:   05/01/2020    Order Specific Question:   Reason for Exam (SYMPTOM  OR DIAGNOSIS REQUIRED)    Answer:   supervision of pregnancy    Order Specific Question:   Preferred imaging location?    Answer:   [facility]  . Obstetric Panel, Including HIV([facility])  . HGB FRAC. W/SOLUBILITY  . POCT urine pregnancy  . Drug Screen, Urine   Meds ordered this encounter  Medications  . Prenatal MV & Min w/FA-DHA (PRENATAL ADULT GUMMY/DHA/FA) 0.4-25 MG CHEW    Sig: Chew 1 tablet by mouth daily.    Dispense:  30 tablet    Refill:  [place], DO PGY-3, [facility] 03/02/2019 5:29 PM

## 2019-03-04 LAB — OBSTETRIC PANEL, INCLUDING HIV
Antibody Screen: NEGATIVE
Basophils Absolute: 0 10*3/uL (ref 0.0–0.2)
Basos: 1 %
EOS (ABSOLUTE): 0.3 10*3/uL (ref 0.0–0.4)
Eos: 6 %
HIV Screen 4th Generation wRfx: NONREACTIVE
Hematocrit: 29.6 % — ABNORMAL LOW (ref 34.0–46.6)
Hemoglobin: 9.8 g/dL — ABNORMAL LOW (ref 11.1–15.9)
Hepatitis B Surface Ag: NEGATIVE
Immature Grans (Abs): 0 10*3/uL (ref 0.0–0.1)
Immature Granulocytes: 0 %
Lymphocytes Absolute: 1.4 10*3/uL (ref 0.7–3.1)
Lymphs: 25 %
MCH: 26.5 pg — ABNORMAL LOW (ref 26.6–33.0)
MCHC: 33.1 g/dL (ref 31.5–35.7)
MCV: 80 fL (ref 79–97)
Monocytes Absolute: 0.5 10*3/uL (ref 0.1–0.9)
Monocytes: 10 %
Neutrophils Absolute: 3.2 10*3/uL (ref 1.4–7.0)
Neutrophils: 58 %
Platelets: 206 10*3/uL (ref 150–450)
RBC: 3.7 x10E6/uL — ABNORMAL LOW (ref 3.77–5.28)
RDW: 13.7 % (ref 11.7–15.4)
RPR Ser Ql: NONREACTIVE
Rh Factor: POSITIVE
Rubella Antibodies, IGG: 2.32 index (ref >0.99)
WBC: 5.4 10*3/uL (ref 3.4–10.8)

## 2019-03-04 LAB — HGB FRAC. W/SOLUBILITY
Hgb A2 Quant: 2.4 % (ref 1.8–3.2)
Hgb A: 97.6 % (ref 96.4–98.8)
Hgb C: 0 %
Hgb F Quant: 0 % (ref 0.0–2.0)
Hgb S: 0 %
Hgb Solubility: NEGATIVE
Hgb Variant: 0 %

## 2019-03-06 LAB — URINE CULTURE, OB REFLEX

## 2019-03-06 LAB — CULTURE, OB URINE

## 2019-03-07 MED ORDER — CEPHALEXIN 250 MG PO CAPS: 250.0000 mg | ORAL_CAPSULE | Freq: Four times a day (QID) | ORAL | 0 refills | Status: AC

## 2019-03-07 NOTE — Addendum Note (Signed)
Addended by: Myles Gip on: 03/07/2019 12:18 PM   Modules accepted: Orders

## 2019-03-09 ENCOUNTER — Encounter (HOSPITAL_COMMUNITY): Payer: Self-pay

## 2019-03-11 ENCOUNTER — Other Ambulatory Visit: Payer: Self-pay | Admitting: Family Medicine

## 2019-03-11 ENCOUNTER — Other Ambulatory Visit (HOSPITAL_COMMUNITY): Payer: Self-pay | Admitting: *Deleted

## 2019-03-11 ENCOUNTER — Ambulatory Visit (HOSPITAL_COMMUNITY): Admission: RE | Admit: 2019-03-11 | Payer: Medicaid Other | Source: Ambulatory Visit

## 2019-03-11 ENCOUNTER — Ambulatory Visit (HOSPITAL_COMMUNITY): Payer: Medicaid Other | Admitting: *Deleted

## 2019-03-11 ENCOUNTER — Other Ambulatory Visit: Payer: Self-pay

## 2019-03-11 ENCOUNTER — Ambulatory Visit (HOSPITAL_COMMUNITY)
Admission: RE | Admit: 2019-03-11 | Discharge: 2019-03-11 | Disposition: A | Discharge status: Home / Self Care | Payer: Medicaid Other | Source: Ambulatory Visit | Attending: Obstetrics and Gynecology | Admitting: Obstetrics and Gynecology

## 2019-03-11 ENCOUNTER — Encounter (HOSPITAL_COMMUNITY): Payer: Self-pay

## 2019-03-11 VITALS — BP 122/67 | HR 66 | Temp 98.2°F

## 2019-03-11 DIAGNOSIS — Z3A24 24 weeks gestation of pregnancy: Secondary | ICD-10-CM | POA: Diagnosis not present

## 2019-03-11 DIAGNOSIS — Z3490 Encounter for supervision of normal pregnancy, unspecified, unspecified trimester: Secondary | ICD-10-CM | POA: Insufficient documentation

## 2019-03-11 DIAGNOSIS — O99321 Drug use complicating pregnancy, first trimester: Secondary | ICD-10-CM | POA: Insufficient documentation

## 2019-03-11 DIAGNOSIS — O99322 Drug use complicating pregnancy, second trimester: Secondary | ICD-10-CM | POA: Diagnosis not present

## 2019-03-11 DIAGNOSIS — O09892 Supervision of other high risk pregnancies, second trimester: Secondary | ICD-10-CM | POA: Diagnosis not present

## 2019-03-11 DIAGNOSIS — O99332 Smoking (tobacco) complicating pregnancy, second trimester: Secondary | ICD-10-CM

## 2019-03-11 DIAGNOSIS — Z362 Encounter for other antenatal screening follow-up: Secondary | ICD-10-CM

## 2019-04-09 ENCOUNTER — Encounter (HOSPITAL_COMMUNITY): Payer: Self-pay

## 2019-04-09 ENCOUNTER — Ambulatory Visit (HOSPITAL_COMMUNITY)
Admission: RE | Admit: 2019-04-09 | Discharge: 2019-04-09 | Disposition: A | Discharge status: Home / Self Care | Payer: Medicaid Other | Source: Ambulatory Visit | Attending: Obstetrics and Gynecology | Admitting: Obstetrics and Gynecology

## 2019-04-09 ENCOUNTER — Ambulatory Visit (HOSPITAL_COMMUNITY): Payer: Medicaid Other | Admitting: *Deleted

## 2019-04-09 ENCOUNTER — Other Ambulatory Visit (HOSPITAL_COMMUNITY): Payer: Self-pay | Admitting: *Deleted

## 2019-04-09 ENCOUNTER — Other Ambulatory Visit: Payer: Self-pay

## 2019-04-09 VITALS — BP 116/66 | HR 73 | Temp 97.7°F

## 2019-04-09 DIAGNOSIS — Z362 Encounter for other antenatal screening follow-up: Secondary | ICD-10-CM

## 2019-04-09 DIAGNOSIS — O99333 Smoking (tobacco) complicating pregnancy, third trimester: Secondary | ICD-10-CM | POA: Diagnosis not present

## 2019-04-09 DIAGNOSIS — Z3A28 28 weeks gestation of pregnancy: Secondary | ICD-10-CM | POA: Diagnosis not present

## 2019-04-09 DIAGNOSIS — O09893 Supervision of other high risk pregnancies, third trimester: Secondary | ICD-10-CM

## 2019-05-07 ENCOUNTER — Other Ambulatory Visit: Payer: Self-pay

## 2019-05-07 ENCOUNTER — Ambulatory Visit (HOSPITAL_COMMUNITY): Payer: Medicaid Other | Admitting: *Deleted

## 2019-05-07 ENCOUNTER — Ambulatory Visit (HOSPITAL_COMMUNITY)
Admission: RE | Admit: 2019-05-07 | Discharge: 2019-05-07 | Disposition: A | Discharge status: Home / Self Care | Payer: Medicaid Other | Source: Ambulatory Visit | Attending: Obstetrics and Gynecology | Admitting: Obstetrics and Gynecology

## 2019-05-07 ENCOUNTER — Encounter (HOSPITAL_COMMUNITY): Payer: Self-pay

## 2019-05-07 VITALS — BP 98/63 | HR 90 | Temp 97.2°F

## 2019-05-07 DIAGNOSIS — O09893 Supervision of other high risk pregnancies, third trimester: Secondary | ICD-10-CM | POA: Diagnosis not present

## 2019-05-07 DIAGNOSIS — O43199 Other malformation of placenta, unspecified trimester: Secondary | ICD-10-CM

## 2019-05-07 DIAGNOSIS — Z362 Encounter for other antenatal screening follow-up: Secondary | ICD-10-CM | POA: Insufficient documentation

## 2019-05-07 DIAGNOSIS — Z3A32 32 weeks gestation of pregnancy: Secondary | ICD-10-CM | POA: Diagnosis not present

## 2019-05-07 DIAGNOSIS — O99323 Drug use complicating pregnancy, third trimester: Secondary | ICD-10-CM

## 2019-05-10 ENCOUNTER — Other Ambulatory Visit (HOSPITAL_COMMUNITY): Payer: Self-pay | Admitting: *Deleted

## 2019-05-10 DIAGNOSIS — O43193 Other malformation of placenta, third trimester: Secondary | ICD-10-CM

## 2019-05-21 NOTE — L&D Delivery Note (Addendum)
OB/GYN Faculty Practice Delivery Note  Erika Porter is a 34 y.o. [id number] s/p VD at [redacted]w[redacted]d. She was admitted for SOL.   ROM: 0h 9m with clear fluid GBS Status: Negative/-- (02/17 1439)  Labor Progress: . Patient presented to L&D for SOL. Initial SVE: 6/80/-1. She progressed to 9.5, AROM performed. She then progressed to complete.   Delivery Date/Time: 1/18 @ 0004 Delivery: Called to room. AROM performed and patient complete and began pushing. Head delivered in LOA. Delivered through tight nuchal cord. Shoulder and body delivered in usual fashion. Infant with spontaneous cry, placed on mother's abdomen, dried and stimulated. Cord clamped x 2 after 1-minute delay, and cut by MGM. Cord blood drawn. Placenta delivered spontaneously with gentle cord traction. Fundus firm with massage and Pitocin. Labia, perineum, vagina, and cervix inspected inspected with no lacerations.  Baby Weight: 2280g  Placenta: Sent to L&D Complications: None Lacerations: None EBL: 200 mL Analgesia: None   Infant:  APGAR (1 MIN): 8   APGAR (5 MINS): 9  APGAR (10 MINS):     Erika Birkenhead, MD [facility] Family Medicine Fellow, [facility] for [facility], [facility] 06/07/2019, 12:37 AM

## 2019-05-28 ENCOUNTER — Ambulatory Visit (HOSPITAL_COMMUNITY): Payer: Medicaid Other | Attending: Obstetrics and Gynecology

## 2019-05-28 ENCOUNTER — Ambulatory Visit (HOSPITAL_COMMUNITY): Payer: Medicaid Other

## 2019-05-28 ENCOUNTER — Encounter (HOSPITAL_COMMUNITY): Payer: Self-pay

## 2019-06-06 ENCOUNTER — Inpatient Hospital Stay (HOSPITAL_COMMUNITY)
Admission: AD | Admit: 2019-06-06 | Discharge: 2019-06-09 | DRG: 806 | Disposition: A | Discharge status: Home / Self Care | Payer: Medicaid Other | Attending: Obstetrics & Gynecology | Admitting: Obstetrics & Gynecology

## 2019-06-06 DIAGNOSIS — O99334 Smoking (tobacco) complicating childbirth: Secondary | ICD-10-CM | POA: Diagnosis present

## 2019-06-06 DIAGNOSIS — Z20822 Contact with and (suspected) exposure to covid-19: Secondary | ICD-10-CM | POA: Diagnosis present

## 2019-06-06 DIAGNOSIS — O99324 Drug use complicating childbirth: Secondary | ICD-10-CM | POA: Diagnosis present

## 2019-06-06 DIAGNOSIS — O26893 Other specified pregnancy related conditions, third trimester: Secondary | ICD-10-CM | POA: Diagnosis present

## 2019-06-06 DIAGNOSIS — Z3A37 37 weeks gestation of pregnancy: Secondary | ICD-10-CM | POA: Diagnosis not present

## 2019-06-06 DIAGNOSIS — O99320 Drug use complicating pregnancy, unspecified trimester: Secondary | ICD-10-CM | POA: Diagnosis present

## 2019-06-06 DIAGNOSIS — F1910 Other psychoactive substance abuse, uncomplicated: Secondary | ICD-10-CM | POA: Diagnosis present

## 2019-06-06 DIAGNOSIS — Z3A38 38 weeks gestation of pregnancy: Secondary | ICD-10-CM

## 2019-06-06 DIAGNOSIS — F17210 Nicotine dependence, cigarettes, uncomplicated: Secondary | ICD-10-CM | POA: Diagnosis present

## 2019-06-06 DIAGNOSIS — F1490 Cocaine use, unspecified, uncomplicated: Secondary | ICD-10-CM | POA: Diagnosis present

## 2019-06-06 DIAGNOSIS — F17200 Nicotine dependence, unspecified, uncomplicated: Secondary | ICD-10-CM | POA: Diagnosis present

## 2019-06-06 DIAGNOSIS — F1290 Cannabis use, unspecified, uncomplicated: Secondary | ICD-10-CM | POA: Diagnosis present

## 2019-06-06 DIAGNOSIS — O0930 Supervision of pregnancy with insufficient antenatal care, unspecified trimester: Secondary | ICD-10-CM

## 2019-06-06 MED ORDER — FENTANYL CITRATE (PF) 100 MCG/2ML IJ SOLN
100.0000 ug | Freq: Once | INTRAMUSCULAR | Status: AC
  Administered 2019-06-07: 100 ug via INTRAVENOUS
  Filled 2019-06-06: qty 2

## 2019-06-06 MED ORDER — LIDOCAINE HCL (PF) 1 % IJ SOLN
30.0000 mL | INTRAMUSCULAR | Status: DC | PRN
  Filled 2019-06-06: qty 30

## 2019-06-06 MED ORDER — FENTANYL CITRATE (PF) 100 MCG/2ML IJ SOLN
INTRAMUSCULAR | Status: AC
  Administered 2019-06-06: 100 ug
  Filled 2019-06-06: qty 2

## 2019-06-06 MED ORDER — LACTATED RINGERS IV SOLN: 500.0000 mL | INTRAVENOUS | Status: DC | PRN

## 2019-06-06 MED ORDER — ACETAMINOPHEN 325 MG PO TABS
650.0000 mg | ORAL_TABLET | ORAL | Status: DC | PRN
  Administered 2019-06-07: 650 mg via ORAL
  Filled 2019-06-06: qty 2

## 2019-06-06 MED ORDER — LACTATED RINGERS IV SOLN: INTRAVENOUS | Status: DC

## 2019-06-06 MED ORDER — OXYTOCIN BOLUS FROM INFUSION
500.0000 mL | Freq: Once | INTRAVENOUS | Status: AC
  Administered 2019-06-07: 500 mL via INTRAVENOUS

## 2019-06-06 MED ORDER — OXYTOCIN 40 UNITS IN NORMAL SALINE INFUSION - SIMPLE MED
2.5000 [IU]/h | INTRAVENOUS | Status: DC
  Filled 2019-06-06: qty 1000

## 2019-06-06 MED ORDER — SOD CITRATE-CITRIC ACID 500-334 MG/5ML PO SOLN: 30.0000 mL | ORAL | Status: DC | PRN

## 2019-06-06 MED ORDER — ONDANSETRON HCL 4 MG/2ML IJ SOLN
4.0000 mg | Freq: Four times a day (QID) | INTRAMUSCULAR | Status: DC | PRN
  Filled 2019-06-06: qty 2

## 2019-06-06 MED ORDER — FENTANYL-BUPIVACAINE-NACL 0.5-0.125-0.9 MG/250ML-% EP SOLN
EPIDURAL | Status: AC
  Filled 2019-06-06: qty 250

## 2019-06-06 NOTE — H&P (Addendum)
OBSTETRIC ADMISSION HISTORY AND PHYSICAL  Erika Porter is a 34 y.o. female [phone] with IUP at [redacted]w[redacted]d by 24 wk [place] presenting for SOL; reports ctx since 1700 that have increased in strength and frequency. She reports +FMs, No LOF, no VB, no blurry vision, headaches or peripheral edema, and RUQ pain.  She plans on bottle feeding. She desires BTL for birth control although paperwork not signed. She received her prenatal care at [facility] x1  Dating: By 24 wk [place] --->  Estimated Date of Delivery: 06/25/19  Sono: 12/18  @[redacted]w[redacted]d , CWD, normal anatomy, cephalic presentation, anterior placental lie, 1691g, 10% EFW  Prenatal History/Complications: Short interval pregnancy One prenatal visit Hx of cocaine and THC use Grand multiparous patient  EFW 10% on 12/18  Past Medical History: Past Medical History:  Diagnosis Date  . Allergic rhinitis 11/18/2011  . Anemia   . BV (bacterial vaginosis)   . Chlamydia   . Headache(784.0)   . Infection    uti  . Trichomonas     Past Surgical History: Past Surgical History:  Procedure Laterality Date  . WISDOM TOOTH EXTRACTION      Obstetrical History: OB History    Gravida  8   Para  6   Term  6   Preterm  0   AB  1   Living  6     SAB  1   TAB  0   Ectopic  0   Multiple  0   Live Births  6           Social History: Social History   Socioeconomic History  . Marital status: Single    Spouse name: Not on file  . Number of children: Not on file  . Years of education: Not on file  . Highest education level: Not on file  Occupational History  . Not on file  Tobacco Use  . Smoking status: Current Every Day Smoker    Packs/day: 1.00    Years: 4.00    Pack years: 4.00    Types: Cigarettes  . Smokeless tobacco: Never Used  . Tobacco comment: quit with preg  Substance and Sexual Activity  . Alcohol use: Not Currently    Alcohol/week: 0.0 standard drinks    Comment: not while preg  . Drug use: Not Currently    Types:  Marijuana, Cocaine    Comment: Reports she cannot eat without marijuana   . Sexual activity: Yes    Birth control/protection: None  Other Topics Concern  . Not on file  Social History Narrative  . Not on file   Social Determinants of Health   Financial Resource Strain:   . Difficulty of Paying Living Expenses: Not on file  Food Insecurity: Food Insecurity Present  . Worried About 01/19/2012 in the Last Year: Sometimes true  . Ran Out of Food in the Last Year: Never true  Transportation Needs: Unmet Transportation Needs  . Lack of Transportation (Medical): Yes  . Lack of Transportation (Non-Medical): Yes  Physical Activity:   . Days of Exercise per Week: Not on file  . Minutes of Exercise per Session: Not on file  Stress:   . Feeling of Stress : Not on file  Social Connections:   . Frequency of Communication with Friends and Family: Not on file  . Frequency of Social Gatherings with Friends and Family: Not on file  . Attends Religious Services: Not on file  . Active Member of Clubs or  Organizations: Not on file  . Attends Banker Meetings: Not on file  . Marital Status: Not on file    Family History: Family History  Problem Relation Age of Onset  . Hypertension Mother   . Anxiety disorder Mother   . Asthma Sister   . Asthma Daughter   . Asthma Son   . Cancer Maternal Aunt   . Heart disease Maternal Grandfather     Allergies: No Known Allergies  Medications Prior to Admission  Medication Sig Dispense Refill Last Dose  . doxycycline (VIBRAMYCIN) 100 MG capsule Take 1 capsule (100 mg total) by mouth 2 (two) times daily. (Patient not taking: Reported on 03/11/2019) 20 capsule 0   . Ferrous Sulfate (IRON PO) Take by mouth.     . ferrous sulfate 325 (65 FE) MG tablet Take 1 tablet (325 mg total) by mouth daily with breakfast. (Patient not taking: Reported on 08/11/2018) 30 tablet 3   . ibuprofen (ADVIL,MOTRIN) 800 MG tablet Take 1 tablet (800 mg  total) by mouth 3 (three) times daily. (Patient not taking: Reported on 08/11/2018) 30 tablet 0   . metroNIDAZOLE (FLAGYL) 500 MG tablet Take 1 tablet (500 mg total) by mouth 2 (two) times daily. (Patient not taking: Reported on 03/11/2019) 14 tablet 0   . Prenatal MV & Min w/FA-DHA (PRENATAL ADULT GUMMY/DHA/FA) 0.4-25 MG CHEW Chew 1 tablet by mouth daily. 30 tablet 9      Review of Systems   All systems reviewed and negative except as stated in HPI  Blood pressure 120/67, pulse 68, last menstrual period 09/18/2018, not currently breastfeeding. General appearance: alert, cooperative and appears stated age Lungs: normal effort Heart: regular rate  Abdomen: soft, non-tender; bowel sounds normal Pelvic: gravid uterus GU: No vaginal lesions  Extremities: Homans sign is negative, no sign of DVT Presentation: cephalic by exam Fetal monitoringBaseline: 140 bpm, Variability: Good {> 6 bpm), Accelerations: Reactive and Decelerations: Absent Uterine activity: Frequency: Every 2-3 minutes  Cervix: 6/80/-1; vertex   Prenatal labs: ABO, Rh: B/Positive/-- (10/13 1735) Antibody: Negative (10/13 1735) Rubella: 2.32 (10/13 1735) RPR: Non Reactive (10/13 1735)  HBsAg: Negative (10/13 1735)  HIV: Non Reactive (10/13 1735)  GBS: Negative/-- (02/17 1439)  2 hr Glucola not done Genetic screening  Not done Anatomy US WNL  Prenatal Transfer Tool  Maternal Diabetes: Unknown Genetic Screening: Not completed Maternal Ultrasounds/Referrals: Normal Fetal Ultrasounds or other Referrals:  None Maternal Substance Abuse:  Yes:  Type: Marijuana, Cocaine Significant Maternal Medications:  None Significant Maternal Lab Results: GBS unknown  No results found for this or any previous visit (from the past 24 hour(s)).  Patient Active Problem List   Diagnosis Date Noted  . 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 Elevated blood pressure reading without diagnosis of hypertension 07/03/2018  . Anemia in pregnancy  03/04/2018  . Abnormal TSH 02/26/2018  . Pap smear abnormality of cervix/human papillomavirus (HPV) positive 02/26/2018  . Limited prenatal care 05/16/2017  . 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 Supervision of pregnancy with grand multiparity 12/05/2016  . Tobacco use disorder 09/12/2008  . Substance abuse affecting pregnancy, antepartum 09/12/2008    Assessment/Plan:  JENNAE HAKEEM is a 34 y.o. [id number] at [redacted]w[redacted]d here for SOL.  #Labor: Vertex by exam. Expectant management. AROM if indicated. Anticipate SVD. #Pain: Per patient request #FWB: Cat I; EFW: 2500g #ID:  GBS unknown; PCR ordered #MOF: Bottle #MOC: Desires BTL but did not sign paperwork; possibly arrange for interval tubal and will discuss other  options post-partum #Circ:  NA; female #Limted PNC and hx of THC/cocaine use: UDS ordered. SW consult post-partum.  Barrington Ellison, MD [facility] Family Medicine Fellow, [facility] for [facility], [facility] 06/06/2019, 11:27 PM

## 2019-06-06 NOTE — MAU Note (Signed)
Pt came to MAU via EMS with contractions a few mins apart. The pt is not handling  Labor well she was rolling all over the bed and could not answer questions. [place] mD checked the pt and said she was 6cm dilated. The pt was taken up to L&D.

## 2019-06-07 ENCOUNTER — Encounter (HOSPITAL_COMMUNITY): Payer: Self-pay | Admitting: Obstetrics & Gynecology

## 2019-06-07 LAB — CBC
HCT: 33.7 % — ABNORMAL LOW (ref 36.0–46.0)
Hemoglobin: 10.6 g/dL — ABNORMAL LOW (ref 12.0–15.0)
MCH: 26.2 pg (ref 26.0–34.0)
MCHC: 31.5 g/dL (ref 30.0–36.0)
MCV: 83.4 fL (ref 80.0–100.0)
Platelets: 211 10*3/uL (ref 150–400)
RBC: 4.04 MIL/uL (ref 3.87–5.11)
RDW: 13.9 % (ref 11.5–15.5)
WBC: 8.5 10*3/uL (ref 4.0–10.5)
nRBC: 0 % (ref 0.0–0.2)

## 2019-06-07 LAB — RPR: RPR Ser Ql: NONREACTIVE

## 2019-06-07 LAB — TYPE AND SCREEN
ABO/RH(D): B POS
Antibody Screen: NEGATIVE

## 2019-06-07 LAB — RESPIRATORY PANEL BY RT PCR (FLU A&B, COVID)
Influenza A by PCR: NEGATIVE
Influenza B by PCR: NEGATIVE
SARS Coronavirus 2 by RT PCR: NEGATIVE

## 2019-06-07 MED ORDER — ACETAMINOPHEN 325 MG PO TABS
650.0000 mg | ORAL_TABLET | Freq: Four times a day (QID) | ORAL | Status: DC | PRN
  Administered 2019-06-07 (×2): 650 mg via ORAL
  Filled 2019-06-07 (×2): qty 2

## 2019-06-07 MED ORDER — DIBUCAINE (PERIANAL) 1 % EX OINT: 1.0000 "application " | TOPICAL_OINTMENT | CUTANEOUS | Status: DC | PRN

## 2019-06-07 MED ORDER — DIPHENHYDRAMINE HCL 25 MG PO CAPS: 25.0000 mg | ORAL_CAPSULE | Freq: Four times a day (QID) | ORAL | Status: DC | PRN

## 2019-06-07 MED ORDER — OXYCODONE HCL 5 MG PO TABS
5.0000 mg | ORAL_TABLET | Freq: Four times a day (QID) | ORAL | Status: DC | PRN
  Administered 2019-06-07 – 2019-06-09 (×7): 5 mg via ORAL
  Filled 2019-06-07 (×7): qty 1

## 2019-06-07 MED ORDER — BENZOCAINE-MENTHOL 20-0.5 % EX AERO
1.0000 "application " | INHALATION_SPRAY | CUTANEOUS | Status: DC | PRN
  Administered 2019-06-07: 1 via TOPICAL
  Filled 2019-06-07: qty 56

## 2019-06-07 MED ORDER — ONDANSETRON HCL 4 MG/2ML IJ SOLN: 4.0000 mg | INTRAMUSCULAR | Status: DC | PRN

## 2019-06-07 MED ORDER — SIMETHICONE 80 MG PO CHEW: 80.0000 mg | CHEWABLE_TABLET | ORAL | Status: DC | PRN

## 2019-06-07 MED ORDER — IBUPROFEN 600 MG PO TABS
600.0000 mg | ORAL_TABLET | Freq: Three times a day (TID) | ORAL | Status: DC | PRN
  Administered 2019-06-07: 600 mg via ORAL
  Filled 2019-06-07: qty 1

## 2019-06-07 MED ORDER — KETOROLAC TROMETHAMINE 10 MG PO TABS
10.0000 mg | ORAL_TABLET | Freq: Four times a day (QID) | ORAL | Status: DC | PRN
  Administered 2019-06-07 – 2019-06-09 (×8): 10 mg via ORAL
  Filled 2019-06-07 (×8): qty 1

## 2019-06-07 MED ORDER — MEASLES, MUMPS & RUBELLA VAC IJ SOLR: 0.5000 mL | Freq: Once | INTRAMUSCULAR | Status: DC

## 2019-06-07 MED ORDER — TETANUS-DIPHTH-ACELL PERTUSSIS 5-2.5-18.5 LF-MCG/0.5 IM SUSP
0.5000 mL | Freq: Once | INTRAMUSCULAR | Status: AC
  Administered 2019-06-07: 0.5 mL via INTRAMUSCULAR

## 2019-06-07 MED ORDER — WITCH HAZEL-GLYCERIN EX PADS: 1.0000 "application " | MEDICATED_PAD | CUTANEOUS | Status: DC | PRN

## 2019-06-07 MED ORDER — ACETAMINOPHEN 500 MG PO TABS
1000.0000 mg | ORAL_TABLET | Freq: Four times a day (QID) | ORAL | Status: DC | PRN
  Administered 2019-06-07 – 2019-06-08 (×6): 1000 mg via ORAL
  Filled 2019-06-07 (×6): qty 2

## 2019-06-07 MED ORDER — COCONUT OIL OIL: 1.0000 "application " | TOPICAL_OIL | Status: DC | PRN

## 2019-06-07 MED ORDER — SENNOSIDES-DOCUSATE SODIUM 8.6-50 MG PO TABS
2.0000 | ORAL_TABLET | ORAL | Status: DC
  Administered 2019-06-08 (×2): 2 via ORAL
  Filled 2019-06-07 (×2): qty 2

## 2019-06-07 MED ORDER — ONDANSETRON HCL 4 MG PO TABS: 4.0000 mg | ORAL_TABLET | ORAL | Status: DC | PRN

## 2019-06-07 MED ORDER — PRENATAL MULTIVITAMIN CH
1.0000 | ORAL_TABLET | Freq: Every day | ORAL | Status: DC
  Administered 2019-06-07 – 2019-06-08 (×2): 1 via ORAL
  Filled 2019-06-07 (×3): qty 1

## 2019-06-07 NOTE — Plan of Care (Signed)
  Problem: Education: Goal: Knowledge of condition will improve Note: Patient having severe cramping. Patient has K-pad; however, she is still crying with pain. Spoke with Dorathy Kinsman, CNM, regarding adjusting the frequency of tylenol and ibuprofen. [place] to adjust orders. Earl Gala, Linda Hedges [place]

## 2019-06-07 NOTE — Discharge Summary (Signed)
Postpartum Discharge Summary     Patient Name: Erika Porter DOB: 01-01-1986 MRN: [id number]  Date of admission: 06/06/2019 Delivering Provider: Chauncey Mann   Date of discharge: 06/09/2019  Admitting diagnosis: Labor and delivery, indication for care [O75.9] Intrauterine pregnancy: [redacted]w[redacted]d    Secondary diagnosis:  Active Problems:   Tobacco use disorder   Substance abuse affecting pregnancy, antepartum   Cocaine use complicating pregnancy   Limited prenatal care   Labor and delivery, indication for care  Additional problems: None     Discharge diagnosis: Term Pregnancy Delivered                                                                                                Post partum procedures:None  Augmentation: AROM  Complications: None  Hospital course:  Onset of Labor With Vaginal Delivery     34y.o. yo [id number] admitted in Active Labor on 06/06/2019. Patient had an uncomplicated labor course as follows: Patient presented to L&D for SOL. Initial SVE: 6/80/-1. She progressed to 9.5, AROM performed. She then progressed to complete.  Membrane Rupture Time/Date: 12:00 AM ,06/07/2019   Intrapartum Procedures: Episiotomy: None [1]                                         Lacerations:  None [1]  Patient had a delivery of a Viable infant. 06/07/2019  Information for the patient's newborn:  CoArli, Bree0[[id number]]Delivery Method: Vaginal, Spontaneous(Filed from Delivery Summary)     Pateint had an uncomplicated postpartum course. Patient opted for Depo for birth control, with an interval BTL (message sent to office). SW consulted. Tox screen on baby showed THC.  She is ambulating, tolerating a regular diet, passing flatus, and urinating well. Patient is discharged home in stable condition on 06/09/19.  Delivery time: 12:04 AM    Magnesium Sulfate received: No BMZ received: No Rhophylac:No MMR:No Transfusion:No  Physical exam  Vitals:   06/08/19  0609 06/08/19 1438 06/08/19 2141 06/09/19 0545  BP: 107/60 131/86 114/83 139/79  Pulse: (!) 52 73 61 (!) 50  Resp: 16 20  20   Temp: 98.3 F (36.8 C) 98.6 F (37 C) 98.8 F (37.1 C) 98.5 F (36.9 C)  TempSrc: Oral Oral Axillary Oral  SpO2: 100% 100% 100% 100%   General: alert, cooperative and no distress Lochia: appropriate Uterine Fundus: firm Incision: N/A DVT Evaluation: No evidence of DVT seen on physical exam. Labs: Lab Results  Component Value Date   WBC 8.5 06/07/2019   HGB 10.6 (L) 06/07/2019   HCT 33.7 (L) 06/07/2019   MCV 83.4 06/07/2019   PLT 211 06/07/2019   CMP Latest Ref Rng & Units 07/03/2018  Glucose 70 - 99 mg/dL 82  BUN 6 - 20 mg/dL 8  Creatinine 0.44 - 1.00 mg/dL 0.54  Sodium 135 - 145 mmol/L 134(L)  Potassium 3.5 - 5.1 mmol/L 3.6  Chloride 98 - 111 mmol/L 105  CO2 22 - 32  mmol/L 21(L)  Calcium 8.9 - 10.3 mg/dL 8.7(L)  Total Protein 6.5 - 8.1 g/dL 7.0  Total Bilirubin 0.3 - 1.2 mg/dL 0.7  Alkaline Phos 38 - 126 U/L 108  AST 15 - 41 U/L 22  ALT 0 - 44 U/L 13    Discharge instruction: per After Visit Summary and "Baby and Me Booklet".  After visit meds:  Allergies as of 06/09/2019   No Known Allergies     Medication List    TAKE these medications   acetaminophen 500 MG tablet Commonly known as: TYLENOL Take 2 tablets (1,000 mg total) by mouth every 6 (six) hours as needed (for pain scale < 4).   ferrous sulfate 325 (65 FE) MG tablet Take 1 tablet (325 mg total) by mouth daily with breakfast.   ibuprofen 800 MG tablet Commonly known as: ADVIL Take 1 tablet (800 mg total) by mouth every 8 (eight) hours as needed.   oxyCODONE 5 MG immediate release tablet Commonly known as: Oxy IR/ROXICODONE Take 1 tablet (5 mg total) by mouth every 6 (six) hours as needed for breakthrough pain.   Prenatal Adult Gummy/DHA/FA 0.4-25 MG Chew Chew 1 tablet by mouth daily.       Diet: routine diet  Activity: Advance as tolerated. Pelvic rest for 6  weeks.   Outpatient follow up:4 weeks Follow up Appt: Future Appointments  Date Time Provider [place]  06/09/2019  2:30 PM Rory Percy, DO [facility] [place]   Follow up Visit:   Please schedule this patient for Postpartum visit in: 4 weeks with the following provider: Any provider Low risk pregnancy complicated by: limited [facility] Delivery mode:  SVD Anticipated Birth Control:  other/unsure PP Procedures needed: None  Schedule Integrated BH visit: no      Newborn Data: Live born female  Birth Weight: 5 lb 0.4 oz (2280 g) APGAR: 8, 9  Newborn Delivery   Birth date/time: 06/07/2019 00:04:00 Delivery type: Vaginal, Spontaneous      Baby Feeding: Bottle Disposition:home with mother   06/09/2019 Merilyn Baba, DO

## 2019-06-08 MED ORDER — MEDROXYPROGESTERONE ACETATE 150 MG/ML IM SUSP
150.0000 mg | Freq: Once | INTRAMUSCULAR | Status: AC
  Administered 2019-06-08: 150 mg via INTRAMUSCULAR
  Filled 2019-06-08: qty 1

## 2019-06-08 NOTE — Clinical Social Work Maternal (Signed)
CLINICAL SOCIAL WORK MATERNAL/CHILD NOTE  Patient Details  Name: Erika Porter MRN: [id number] Date of Birth: Oct 03, 1985  Date:  06/08/2019  Clinical Social Worker Initiating Note:  Elijio Miles Date/Time: Initiated:  06/08/19/0944     Child's Name:  Erika Porter   Biological Parents:  Mother(MOB reported she and potential FOB are getting a paternity test once discharged)   Need for Interpreter:  None   Reason for Referral:  Current Substance Use/Substance Use During Pregnancy , Late or No Prenatal Care    Address:  [place] [address]    Phone number:  [phone] (home)     Additional phone number:   Household Members/Support Persons (HM/SP):   Household Member/Support Person 1, Household Member/Support Person 2, Household Member/Support Person 3, Household Member/Support Person 4, Household Member/Support Person 5, Household Member/Support Person 6   HM/SP Name Relationship DOB or Age  HM/SP -1 Glenetta Hew 11/01/2000  HM/SP -2 Jah'son Lomax Son 01/03/2004  HM/SP -3 Rah'Niya Woolridge Daughter 04/04/2009  HM/SP -4 Margretta Ditty Son 07/16/2013  HM/SP -5 Olivia Mackie Son 05/16/2017  HM/SP -6 [name] Son 07/20/2018  HM/SP -7        HM/SP -8          Natural Supports (not living in the home):  Parent, Immediate Family, Friends   Chiropodist: None   Employment: Unemployed   Type of Work:     Education:  9 to 11 years   Homebound arranged:    Museum/gallery curator Resources:  Medicaid   Other Resources:  Physicist, medical , [place] Considerations Which May Impact Care:    Strengths:  Ability to meet basic needs , Home prepared for child , Pediatrician chosen   Psychotropic Medications:         Pediatrician:    [place] area  Pediatrician List:   [place]      Pediatrician  Fax Number:    Risk Factors/Current Problems:  Substance Use    Cognitive State:  Able to Concentrate , Alert , Linear Thinking    Mood/Affect:  Bright , Calm , Comfortable , Interested , Relaxed    CSW Assessment:  CSW received consult for [facility] and substance use during pregnancy.  CSW met with MOB to offer support and complete assessment.    MOB resting in bed with infant asleep in bassinet, when CSW entered the room. MOB pleasant and welcoming of CSW visit and appeared engaged throughout assessment. MOB stated she currently lives in [place] with her seven children. CSW aware there was concern MOB did not have custody of her children and CSW addressed this with MOB who confirmed she does have custody of all of her children. MOB confirmed she receives both [facility] and food stamps and was encouraged to call and update them both of her delivery so that infant could be added to her plans. CSW inquired about MOB's mental health history and MOB denied having any and denied any history of PPD/A. CSW provided education regarding the baby blues period vs. perinatal mood disorders, discussed treatment and gave resources for mental health follow up if concerns arise. CSW recommends self-evaluation during the postpartum time period using the New Mom Checklist from Postpartum Progress and encouraged MOB to contact a medical professional if symptoms are noted at any time. MOB  did not appear to be displaying any acute mental health symptoms and denied any current SI, HI or DV. MOB reported feeling well-supported by her mother, sister and best friend. MOB confirmed having a majority of the essential items needed for infant once discharged. Per MOB, the only thing she does not have is a car seat but reported she would have one by discharge. CSW encouraged MOB to reach out if car seat is unable to be obtained. MOB confirmed infant would be sleeping in a bassinet once home. CSW provided review of Sudden Infant  Death Syndrome (SIDS) precautions and safe sleeping habits.   CSW inquired about MOB's reasoning for her limited prenatal care and MOB reported transportation was her main barrier. CSW asked if this would be a barrier to getting infant to her follow up appointments and MOB denied and reported she has a car she can use. CSW informed MOB of Hospital Drug Policy and explained UDS came back positive for THC. MOB seemed surprised by this and asked for clarification that she and infant only tested positive for THC. CSW explained UDS was not collected on MOB but that infant's UDS was only positive for THC. CSW explained that CDS had not yet resulted and would continue to be monitored for any other substances. CSW addressed positive UDS on 03/02/2019 for cocaine and THC and MOB acknowledged cocaine use prior to "October of November" when she found out "baby's feeding tube was to the left and not in the middle". MOB shared she uses THC about twice a week because "she has seven kids" and it helps with "peace of mind".  CSW informed MOB that due to infant's positive UDS for [facility] that a [facility] CPS report would be made. MOB denied any questions or concerns regarding policy or report. MOB reported previous CPS involvement with most recent case during pregnancy due to a report made by her son's grandmother. MOB stated everything that was reported was a lie and that case was closed about a month after being opened. MOB asked that CSW call FOB and tell him everything CSW had told her regarding UDS results as he did not believe she had quit using cocaine. CSW inquired about if FOB would be at the hospital to speak with as CSW would not be able to call him and MOB denied. MOB called FOB while CSW still present and placed FOB on speakerphone. CSW explained infant's UDS results and explained that CDS results were still pending. FOB denied any questions or concerns.   CSW made [facility] CPS report due to infant's  positive UDS for marijuana.   CSW Plan/Description:  Sudden Infant Death Syndrome (SIDS) Education, Perinatal Mood and Anxiety Disorder (PMADs) Education, [place], Child Protective Service Report , CSW Will Continue to Monitor Umbilical Cord Tissue Drug Screen Results and Make Report if Warranted, Other Information/Referral to [employer], [place] 06/08/2019, 10:52 AM

## 2019-06-08 NOTE — Progress Notes (Addendum)
POSTPARTUM PROGRESS NOTE  Subjective: Erika Porter is a 34 y.o. [phone] on PPD#1 s/p vaginal delivery at [redacted]w[redacted]d.  She reports she doing well. No acute events overnight. She denies any problems with ambulating, voiding or po intake. Denies nausea or vomiting. She has passed flatus. Pain is well controlled.  Lochia is appropriate.  Objective: Blood pressure 107/60, pulse (!) 52, temperature 98.3 F (36.8 C), temperature source Oral, resp. rate 16, last menstrual period 09/18/2018, SpO2 100 %, unknown if currently breastfeeding.  Physical Exam:  General: alert, cooperative and no distress Chest: no respiratory distress Abdomen: soft, non-tender  Uterine Fundus: firm, appropriately tender Extremities: No calf swelling or tenderness  No edema  Recent Labs    06/07/19 0002  HGB 10.6*  HCT 33.7*    Assessment/Plan: Erika Porter is a 34 y.o. [id number] s/p vaginal delivery at [redacted]w[redacted]d.  Routine Postpartum Care: Doing well, pain well-controlled.  -- Continue routine care, lactation support  -- Contraception: Depo, plans interval BTL -- Feeding: bottle -- SW consult for insufficient PNC and Hx cocaine and THC use.  Dispo: Plan for discharge tomorrow.  Marlowe Alt, DO OB/GYN Fellow, [facility] for [facility]

## 2019-06-08 NOTE — Progress Notes (Signed)
CSW informed CPS report was screened in as a 24 hour response and assigned to Teresa Gilliard. CSW provided escort to MOB's room so that Teresa could initiate services. CSW to follow up with Teresa following assessment regarding disposition.  Hope Brandenburger, LCSW [facility] [phone]  

## 2019-06-09 ENCOUNTER — Encounter: Payer: Medicaid Other | Admitting: Family Medicine

## 2019-06-09 MED ORDER — ACETAMINOPHEN 500 MG PO TABS: 1000.0000 mg | ORAL_TABLET | Freq: Four times a day (QID) | ORAL | 0 refills | Status: AC | PRN

## 2019-06-09 MED ORDER — OXYCODONE HCL 5 MG PO TABS: 5.0000 mg | ORAL_TABLET | Freq: Four times a day (QID) | ORAL | 0 refills | Status: DC | PRN

## 2019-06-09 MED ORDER — IBUPROFEN 800 MG PO TABS: 800.0000 mg | ORAL_TABLET | Freq: Three times a day (TID) | ORAL | 0 refills | Status: DC | PRN

## 2019-06-09 NOTE — Progress Notes (Signed)
CSW spoke with Teresa Gilliard with [place] CPS regarding disposition. Per Teresa, no barriers to infant discharging to MOB once medically ready. CPS to follow up with MOB once home.   CSW aware MOB in need of car seat. CSW to provide MOB with car seat and baby bundle prior to discharge.   Payson Crumby, LCSW [facility] [phone]  

## 2019-06-09 NOTE — Progress Notes (Signed)
Patient Erika Porter is a 34 y.o. [id number] At 2 days PP, ready for discharge today. She expressed desire in a BTL previously, but had not signed her papers in time. Per discussion today, she does not want BTL (her partner may want more children in the future).   She received Depo in the hospital, and refused a Nexplanon placement before discharge. She is afraid of the arm pain due to Nexplanon.  Strongly encouraged patient to keep PP visit for Nexplanon placement, given her grand multiparity and her strong desire for no more children.  Will message [facility] to make sure that patient gets scheduled for PP visit in person with NExplanon.   Luna Kitchens

## 2019-07-01 ENCOUNTER — Ambulatory Visit: Payer: Medicaid Other | Admitting: Obstetrics & Gynecology

## 2019-08-18 ENCOUNTER — Ambulatory Visit (INDEPENDENT_AMBULATORY_CARE_PROVIDER_SITE_OTHER): Payer: Medicaid Other | Admitting: Family Medicine

## 2019-08-18 ENCOUNTER — Encounter: Payer: Self-pay | Admitting: Family Medicine

## 2019-08-18 ENCOUNTER — Other Ambulatory Visit: Payer: Self-pay

## 2019-08-18 VITALS — BP 104/62 | HR 87 | Wt 154.0 lb

## 2019-08-18 DIAGNOSIS — Z308 Encounter for other contraceptive management: Secondary | ICD-10-CM

## 2019-08-18 DIAGNOSIS — M545 Low back pain, unspecified: Secondary | ICD-10-CM

## 2019-08-18 DIAGNOSIS — M549 Dorsalgia, unspecified: Secondary | ICD-10-CM | POA: Insufficient documentation

## 2019-08-18 HISTORY — DX: Dorsalgia, unspecified: M54.9

## 2019-08-18 MED ORDER — CYCLOBENZAPRINE HCL 5 MG PO TABS: 5.0000 mg | ORAL_TABLET | Freq: Every evening | ORAL | 0 refills | Status: DC | PRN

## 2019-08-18 NOTE — Assessment & Plan Note (Signed)
Discussed extensively today.  Patient elects for Nexplanon placement, made follow-up appointment for placement next week once her mom her boyfriend is able to be with her.  Currently still within 3 months timeframe of previously received Depo.

## 2019-08-18 NOTE — Assessment & Plan Note (Addendum)
Acute, noted after playful wrestling with boyfriend and refractory to NSAIDs without radicular symptoms.  No symptoms or findings concerning for infection, cauda equina syndrome, dissection.  Will provide short course of muscle relaxers.  Encouraged patient to use heating pad.  Follow-up as needed.

## 2019-08-18 NOTE — Progress Notes (Signed)
    SUBJECTIVE:   CHIEF COMPLAINT: back pain  HPI:   BACK PAIN  Back pain began 2 weeks ago after playful wrestling with her boyfriend. Pain is described as sharp. Patient has tried advil, doesn't help. Pain does not radiate. History of trauma or injury: No Pain is exacerbated by turning to the right side and reaching for things, picking up baby.  Prior history of similar pain: No History of cancer: No Weak immune system: No History of IV drug use: No  History of steroid use: No  Symptoms Incontinence of bowel or bladder:  no Numbness of leg: no Fever: no Rest or Night pain: no Weight Loss:  no Rash: no  Contraception management G8 P7-0-1-7 with recent delivery 06/07/2019.  Received Depo injection prior to discharge with next window April 7-21.  Patient is unsure if she would like to have more children.  Initially had appointment scheduled for Nexplanon placement but wants to wait until her mom or boyfriend is with her.  She has had a Nexplanon in the past with good effect.  She knows she does not want to continue on Depo long-term due to persistent bleeding.  Would consider IUD but is concerned about the pain associated with insertion.  PERTINENT  PMH / PSH: Tobacco use, polysubstance use, recent pregnancy  OBJECTIVE:   BP 104/62   Pulse 87   Wt 154 lb (69.9 kg)   LMP 09/18/2018   SpO2 99%   BMI 24.12 kg/m   GEN: Well-appearing, NAD MSK: No midline spinal tenderness.  Tender to point palpation of right-sided paravertebral musculature with slight hypertonicity.  Full AROM with pain with left-sided sidebending and extension.  Preserved lower extremity strength.  Normal gait.  ASSESSMENT/PLAN:   Back pain Acute, noted after playful wrestling with boyfriend and refractory to NSAIDs without radicular symptoms.  No symptoms or findings concerning for infection, cauda equina syndrome, dissection.  Will provide short course of muscle relaxers.  Encouraged patient to use  heating pad.  Follow-up as needed.  Contraception management Discussed extensively today.  Patient elects for Nexplanon placement, made follow-up appointment for placement next week once her mom her boyfriend is able to be with her.  Currently still within 3 months timeframe of previously received Depo.   Ellwood Dense, DO [facility]

## 2019-08-18 NOTE — Patient Instructions (Addendum)
It was great to see you!  Our plans for today:  - Take the muscle relaxer as needed for pain. This can make you sleepy so try taking this at night.  If you take this during the day, do not drive after you take this. - Try a heating pad for your pain.  You can continue taking Tylenol or ibuprofen as needed. - Make an appointment to get your Nexplanon placed.  Your next Depo-Provera shot will be due April 7-April 21.  Take care and seek immediate care sooner if you develop any concerns.   Dr. [facility]

## 2019-08-25 ENCOUNTER — Other Ambulatory Visit (HOSPITAL_COMMUNITY)
Admission: RE | Admit: 2019-08-25 | Discharge: 2019-08-25 | Disposition: A | Discharge status: Home / Self Care | Payer: Medicaid Other | Source: Ambulatory Visit | Attending: Family Medicine | Admitting: Family Medicine

## 2019-08-25 ENCOUNTER — Telehealth: Payer: Self-pay | Admitting: Family Medicine

## 2019-08-25 ENCOUNTER — Encounter: Payer: Self-pay | Admitting: Family Medicine

## 2019-08-25 ENCOUNTER — Ambulatory Visit: Payer: Medicaid Other | Admitting: Family Medicine

## 2019-08-25 ENCOUNTER — Other Ambulatory Visit: Payer: Self-pay

## 2019-08-25 VITALS — BP 102/60 | HR 86 | Wt 156.2 lb

## 2019-08-25 DIAGNOSIS — Z975 Presence of (intrauterine) contraceptive device: Secondary | ICD-10-CM

## 2019-08-25 DIAGNOSIS — Z3046 Encounter for surveillance of implantable subdermal contraceptive: Secondary | ICD-10-CM | POA: Diagnosis not present

## 2019-08-25 DIAGNOSIS — N898 Other specified noninflammatory disorders of vagina: Secondary | ICD-10-CM | POA: Diagnosis not present

## 2019-08-25 DIAGNOSIS — Z30017 Encounter for initial prescription of implantable subdermal contraceptive: Secondary | ICD-10-CM | POA: Diagnosis not present

## 2019-08-25 LAB — POCT WET PREP (WET MOUNT)
Clue Cells Wet Prep Whiff POC: POSITIVE
Trichomonas Wet Prep HPF POC: ABSENT

## 2019-08-25 NOTE — Progress Notes (Signed)
    SUBJECTIVE:   CHIEF COMPLAINT: Nexplanon insertion, STD check  HPI:   Vaginal odor - fishy odor, partner noticed since deliver of baby in January. - no itching, burning, or abnl vaginal discharge. - sexually active with one female partner.  - no condoms.  - no known STD exposure.  Contraception management - [phone] - Menses: spotting since initiating depo - Contraception: depo prior to d/c after recent 05/2019 birth, next window 4/7. - Cancer screening: UTD - unsure if she wants more children - currently sexually active without condoms  PERTINENT  PMH / PSH: Tobacco use, polysubstance use, multigravida  OBJECTIVE:   BP 102/60   Pulse 86   Wt 156 lb 4 oz (70.9 kg)   LMP 09/18/2018   SpO2 99%   BMI 24.47 kg/m   Gen: well appearing, in NAD GYN:  External genitalia within normal limits.  Vaginal mucosa pink, moist, normal rugae.  Nonfriable cervix without lesions, moderate white discharge noted on speculum exam.  No bleeding. No cervical motion tenderness.   Nexplanon Insertion Risks & benefits of Nexplanon discussed, consent form signed and placed in chart.   Pt was placed in supine position. The left arm was flexed at the elbow and externally rotated so that her wrist was parallel to her ear The medial epicondyle of the left arm was identified The insertions site was marked 8 cm proximal to the medial epicondyle The insertion site was cleaned with Betadine 1% lidocaine was injected just under the skin at the insertion site extending 4 cm proximally. The sterile preloaded disposable Nexaplanon applicator was removed from the sterile packaging The applicator needle was inserted at a 30 degree angle at 8 cm proximal to the medial epicondyle as marked The applicator was lowered to a horizontal position and advanced just under the skin for the full length of the needle The slider on the applicator was retracted fully while the applicator remained in the same position,  then the applicator was removed. The implant was confirmed via palpation as being in position The implant position was demonstrated to the patient Pressure dressing was applied to the patient.  The patient was instructed to removed the pressure dressing in 24 hrs. The patient was advised to move slowly from a supine to an upright position The patient tolerated procedure with no immediate complications.   ASSESSMENT/PLAN:   Contraception management Nexplanon placed without immediate complications. No need for bHcg confirmation given inside window for depo. Advised on condom use for STI prevention. Post-procedure instructions provided.  Vaginal odor Wet prep consistent with BV. Flagyl x7 days sent to pharmacy. Currently bottle feeding. Also obtained GC/CT, will call with results. RTC if no better after treatment.     Ellwood Dense, DO [place] [facility]

## 2019-08-25 NOTE — Telephone Encounter (Signed)
Called patient regarding labs.  Wet prep is consistent with BV. Need to make sure she is bottle feeding her daughter before prescribing metronidazole. If so, I will send this in. GC/CT still pending.

## 2019-08-26 ENCOUNTER — Encounter: Payer: Self-pay | Admitting: Family Medicine

## 2019-08-26 DIAGNOSIS — N898 Other specified noninflammatory disorders of vagina: Secondary | ICD-10-CM

## 2019-08-26 DIAGNOSIS — Z975 Presence of (intrauterine) contraceptive device: Secondary | ICD-10-CM | POA: Insufficient documentation

## 2019-08-26 HISTORY — DX: Other specified noninflammatory disorders of vagina: N89.8

## 2019-08-26 LAB — CERVICOVAGINAL ANCILLARY ONLY
Chlamydia: NEGATIVE
Comment: NEGATIVE
Comment: NEGATIVE
Comment: NORMAL
Neisseria Gonorrhea: NEGATIVE
Trichomonas: NEGATIVE

## 2019-08-26 MED ORDER — METRONIDAZOLE 500 MG PO TABS: 500.0000 mg | ORAL_TABLET | Freq: Two times a day (BID) | ORAL | 0 refills | Status: AC

## 2019-08-26 MED ORDER — ETONOGESTREL 68 MG ~~LOC~~ IMPL
68.0000 mg | DRUG_IMPLANT | Freq: Once | SUBCUTANEOUS | Status: AC
  Administered 2019-08-25: 68 mg via SUBCUTANEOUS

## 2019-08-26 NOTE — Assessment & Plan Note (Signed)
Wet prep consistent with BV. Flagyl x7 days sent to pharmacy. Currently bottle feeding. Also obtained GC/CT, will call with results. RTC if no better after treatment.

## 2019-08-26 NOTE — Assessment & Plan Note (Signed)
Nexplanon placed without immediate complications. No need for bHcg confirmation given inside window for depo. Advised on condom use for STI prevention. Post-procedure instructions provided.

## 2019-10-13 ENCOUNTER — Ambulatory Visit: Payer: Medicaid Other | Admitting: Family Medicine

## 2019-11-12 ENCOUNTER — Other Ambulatory Visit: Payer: Self-pay

## 2019-11-12 ENCOUNTER — Ambulatory Visit (INDEPENDENT_AMBULATORY_CARE_PROVIDER_SITE_OTHER): Payer: Medicaid Other | Admitting: Family Medicine

## 2019-11-12 VITALS — BP 100/74 | HR 93 | Ht 67.0 in | Wt 156.4 lb

## 2019-11-12 DIAGNOSIS — N939 Abnormal uterine and vaginal bleeding, unspecified: Secondary | ICD-10-CM

## 2019-11-12 HISTORY — DX: Abnormal uterine and vaginal bleeding, unspecified: N93.9

## 2019-11-12 MED ORDER — NORGESTIMATE-ETH ESTRADIOL 0.25-35 MG-MCG PO TABS: 1.0000 | ORAL_TABLET | Freq: Every day | ORAL | 3 refills | Status: DC

## 2019-11-12 NOTE — Assessment & Plan Note (Signed)
Likely menstrual cycle changes secondary to hormonal contraceptive device.  Will give patient 3 months of Ortho Tri-Cyclen to see if this can regulate her menstrual cycles.  Patient is not breast-feeding.  No history of blood clots.  Advised patient to try this for least 1 month and to follow-up in 4 to 6 weeks.  Will not get CBC as patient describes "light" bleeding and no symptoms of anemia.

## 2019-11-12 NOTE — Patient Instructions (Signed)
Irregular bleeding patterns are not uncommon after a nexplanon or IUD is placed.  When this happens we usually add oral contraceptive pills for three months to regulate the bleeding.  Please try these medications for at least a month before deciding if it's working.

## 2019-11-12 NOTE — Progress Notes (Signed)
    SUBJECTIVE:   CHIEF COMPLAINT / HPI:   Daily vaginal bleeding: The patient had a Nexplanon placed on 4/21.  After that she had a regular.,  But then periods became more prolonged (going from 7 to 10 days) with some spotting in between and now she is currently having 3 weeks of continuous daily bleeding.  She describes this as a light.  She denies any symptoms of anemia such as fatigue, shortness of breath.  She also had bleeding when she use the Depo shot.  Otherwise when not on birth control she has normal periods.  She does not desire to have any more children, but does not want tubal ligation.  She does not think she can take daily pills without forgetting.  She denies any abdominal pain or cramping.  PERTINENT  PMH / PSH:   OBJECTIVE:   BP 100/74   Pulse 93   Ht 5\' 7"  (1.702 m)   Wt 156 lb 6.4 oz (70.9 kg)   SpO2 98%   BMI 24.50 kg/m   General: Alert.  Oriented.  No acute distress. GU: Patient deferred Pulmonary: No respiratory distress, no tachypnea.  ASSESSMENT/PLAN:   Abnormal uterine bleeding Likely menstrual cycle changes secondary to hormonal contraceptive device.  Will give patient 3 months of Ortho Tri-Cyclen to see if this can regulate her menstrual cycles.  Patient is not breast-feeding.  No history of blood clots.  Advised patient to try this for least 1 month and to follow-up in 4 to 6 weeks.  Will not get CBC as patient describes "light" bleeding and no symptoms of anemia.     , MD [facility]

## 2020-02-14 ENCOUNTER — Encounter: Payer: Self-pay | Admitting: Family Medicine

## 2020-02-14 ENCOUNTER — Ambulatory Visit (INDEPENDENT_AMBULATORY_CARE_PROVIDER_SITE_OTHER): Payer: Medicaid Other | Admitting: Family Medicine

## 2020-02-14 ENCOUNTER — Other Ambulatory Visit: Payer: Self-pay

## 2020-02-14 ENCOUNTER — Other Ambulatory Visit (HOSPITAL_COMMUNITY)
Admission: RE | Admit: 2020-02-14 | Discharge: 2020-02-14 | Disposition: A | Discharge status: Home / Self Care | Payer: Medicaid Other | Source: Ambulatory Visit | Attending: Family Medicine | Admitting: Family Medicine

## 2020-02-14 VITALS — Wt 154.0 lb

## 2020-02-14 DIAGNOSIS — M25512 Pain in left shoulder: Secondary | ICD-10-CM

## 2020-02-14 DIAGNOSIS — Z113 Encounter for screening for infections with a predominantly sexual mode of transmission: Secondary | ICD-10-CM | POA: Diagnosis not present

## 2020-02-14 HISTORY — DX: Pain in left shoulder: M25.512

## 2020-02-14 MED ORDER — METHOCARBAMOL 500 MG PO TABS: 500.0000 mg | ORAL_TABLET | Freq: Every evening | ORAL | 0 refills | Status: DC | PRN

## 2020-02-14 NOTE — Patient Instructions (Signed)
Thank you for coming in to see [place] today! Please see below to review our plan for today's visit:  1. Take Methacarbamol/Robaxin at night about 1 hour before bed time to "reset" your muscles.  2. Stretch your neck and shoulders in the morning and in the afternoon.  3. Please follow up as needed  Please call the clinic at [phone] if your symptoms worsen or you have any concerns. It was our pleasure to serve you!   Dr. Peggyann Shoals [facility]

## 2020-02-14 NOTE — Assessment & Plan Note (Signed)
Fully functional left shoulder appreciated on physical exam with normal range of motion, strength, and sensation.  No reason at this time think rotator cuff tear or adhesive capsulitis as cause for patient's pain.  Tenderness appreciated over subscapularis, trapezius, rhomboids, and levator scapulae.  Patient symptoms most likely due to acute muscle strain while cleaning. -Patient provided with Robaxin 500 mg, instructed to take 1 tablet at night before bedtime -Provided with information regarding stretches to do to reduce her symptoms -Can continue heat/cold compresses, Tylenol as needed

## 2020-02-14 NOTE — Progress Notes (Signed)
    SUBJECTIVE:   CHIEF COMPLAINT / HPI:   Left shoulder pain: Patient reports she has had left shoulder/upper back pain that started about 1 month ago.  Reports the pain started after she had moved some furniture around so she clean her house.  She reports that the muscles near her left shoulder and upper back are tender to touch, her partner sometimes presses on/massages the muscles and it feels better.  Also feels better with cold compresses, not better with warm compress.  Has not tried Tylenol, ibuprofen.  Denies any falls, traumas.  Denies any chest pain or shortness of breath.  Patient is right-handed.  STI Check: Patient presents to clinic today for routine STI screening.  Denies any current symptoms, vaginal discharge, pelvic pain.  PERTINENT  PMH / PSH:  Patient Active Problem List   Diagnosis Date Noted  . Acute pain of left shoulder 02/14/2020  . Abnormal uterine bleeding 11/12/2019  . Nexplanon in place 08/26/2019  . Vaginal odor 08/26/2019  . Back pain 08/18/2019  . Labor and delivery, indication for care 06/06/2019  . 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 Elevated blood pressure reading without diagnosis of hypertension 07/03/2018  . Anemia in pregnancy 03/04/2018  . Abnormal TSH 02/26/2018  . Pap smear abnormality of cervix/human papillomavirus (HPV) positive 02/26/2018  . Limited prenatal care 05/16/2017  . 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 Supervision of pregnancy with grand multiparity 12/05/2016  . Routine screening for STI (sexually transmitted infection) 09/18/2016  . Contraception management 03/15/2011  . Tobacco use disorder 09/12/2008  . Substance abuse affecting pregnancy, antepartum 09/12/2008     OBJECTIVE:   Wt 154 lb (69.9 kg)   LMP 02/13/2020 (Exact Date)   Breastfeeding No   BMI 24.12 kg/m    Physical exam: General: Pleasant patient, no apparent distress Respiratory: Comfortable work of breathing, speaking complete sentences LEFT  Shoulder: Inspection reveals no obvious deformity, atrophy, or asymmetry. No bruising. No swelling Palpation: Some TTP appreciated over left trapezius, levator scapula they, and teres major. No TTP over [facility] joint or bicipital groove. Full ROM in flexion, abduction, internal/external rotation NV intact distally Normal scapular function observed. Special Tests:  - Impingement: neg empty can sign. - Supraspinatous: Negative empty can.  5/5 strength with resisted flexion at 20 degrees - Infraspinatous/Teres Minor: 5/5 strength with ER - Subscapularis: 5/5 strength with IR - No painful arc and no drop arm sign   ASSESSMENT/PLAN:   Acute pain of left shoulder Fully functional left shoulder appreciated on physical exam with normal range of motion, strength, and sensation.  No reason at this time think rotator cuff tear or adhesive capsulitis as cause for patient's pain.  Tenderness appreciated over subscapularis, trapezius, rhomboids, and levator scapulae.  Patient symptoms most likely due to acute muscle strain while cleaning. -Patient provided with Robaxin 500 mg, instructed to take 1 tablet at night before bedtime -Provided with information regarding stretches to do to reduce her symptoms -Can continue heat/cold compresses, Tylenol as needed   STI check: Patient checked today for gonorrhea and chlamydia with urine testing -We will follow-up with results and treat as needed  Dollene Cleveland, DO [facility]

## 2020-02-15 LAB — URINE CYTOLOGY ANCILLARY ONLY
Chlamydia: NEGATIVE
Comment: NEGATIVE
Comment: NEGATIVE
Comment: NORMAL
Neisseria Gonorrhea: NEGATIVE
Trichomonas: NEGATIVE

## 2020-02-16 ENCOUNTER — Encounter: Payer: Self-pay | Admitting: Family Medicine

## 2020-03-11 ENCOUNTER — Telehealth: Payer: Self-pay | Admitting: Family Medicine

## 2020-03-11 NOTE — Telephone Encounter (Signed)
error 

## 2020-03-22 ENCOUNTER — Ambulatory Visit: Payer: Medicaid Other

## 2020-03-22 NOTE — Progress Notes (Deleted)
   Subjective:   Patient ID: Erika Porter    DOB: 01-01-1986, 34 y.o. female   MRN: [id number]  NEVILLE PAULS is a 34 y.o. female with a history of *** here for ***  Knee Pain: Twisted knee last week, still has swelling    Review of Systems:  Per HPI.   Objective:   There were no vitals taken for this visit. Vitals and nursing note reviewed.  General: pleasant ***, sitting comfortably in exam chair, well nourished, well developed, in no acute distress with non-toxic appearance HEENT: normocephalic, atraumatic, moist mucous membranes, oropharynx clear without erythema or exudate, TM normal bilaterally  Neck: supple, non-tender without lymphadenopathy CV: regular rate and rhythm without murmurs, rubs, or gallops, no lower extremity edema, 2+ radial and pedal pulses bilaterally Lungs: clear to auscultation bilaterally with normal work of breathing on room air Resp: breathing comfortably on room air, speaking in full sentences Abdomen: soft, non-tender, non-distended, no masses or organomegaly palpable, normoactive bowel sounds Skin: warm, dry, no rashes or lesions Extremities: warm and well perfused, normal tone MSK: ROM grossly intact, strength intact, gait normal Neuro: Alert and oriented, speech normal  Assessment & Plan:   No problem-specific Assessment & Plan notes found for this encounter.  No orders of the defined types were placed in this encounter.  No orders of the defined types were placed in this encounter.   Orpah Cobb, DO PGY-3, [facility] 03/22/2020 8:31 AM

## 2020-03-28 ENCOUNTER — Other Ambulatory Visit: Payer: Self-pay

## 2020-03-28 ENCOUNTER — Ambulatory Visit: Payer: Medicaid Other | Admitting: Family Medicine

## 2020-03-28 ENCOUNTER — Encounter: Payer: Self-pay | Admitting: Family Medicine

## 2020-03-28 VITALS — BP 120/80 | HR 88 | Ht 67.0 in | Wt 156.0 lb

## 2020-03-28 DIAGNOSIS — M25561 Pain in right knee: Secondary | ICD-10-CM | POA: Insufficient documentation

## 2020-03-28 HISTORY — DX: Pain in right knee: M25.561

## 2020-03-28 NOTE — Patient Instructions (Addendum)
It was a great meeting you today!  Today we discussed the following:  Acute right knee pain: At the moment, I am not concerned enough with the pain and symptoms to need imaging.  Please take NSAIDs or Tylenol as needed for the pain.  I also recommend some light compression, this can be achieved with a knee sleeve that can also help with stability.           Acute Knee Pain, Adult Many things can cause knee pain. Sometimes, knee pain is sudden (acute) and may be caused by damage, swelling, or irritation of the muscles and tissues that support your knee. The pain often goes away on its own with time and rest. If the pain does not go away, tests may be done to find out what is causing the pain. Follow these instructions at home: Pay attention to any changes in your symptoms. Take these actions to relieve your pain. If you have a knee sleeve or brace:   Wear the sleeve or brace as told by your doctor. Remove it only as told by your doctor.  Loosen the sleeve or brace if your toes: ? Tingle. ? Become numb. ? Turn cold and blue.  Keep the sleeve or brace clean.  If the sleeve or brace is not waterproof: ? Do not let it get wet. ? Cover it with a watertight covering when you take a bath or shower. Activity  Rest your knee.  Do not do things that cause pain.  Avoid activities where both feet leave the ground at the same time (high-impact activities). Examples are running, jumping rope, and doing jumping jacks.  Work with a physical therapist to make a safe exercise program, as told by your doctor. Managing pain, stiffness, and swelling   If told, put ice on the knee: ? Put ice in a plastic bag. ? Place a towel between your skin and the bag. ? Leave the ice on for 20 minutes, 2-3 times a day.  If told, put pressure (compression) on your injured knee to control swelling, give support, and help with discomfort. Compression may be done with an elastic bandage. General  instructions  Take all medicines only as told by your doctor.  Raise (elevate) your knee while you are sitting or lying down. Make sure your knee is higher than your heart.  Sleep with a pillow under your knee.  Do not use any products that contain nicotine or tobacco. These include cigarettes, e-cigarettes, and chewing tobacco. These products may slow down healing. If you need help quitting, ask your doctor.  If you are overweight, work with your doctor and a food expert (dietitian) to set goals to lose weight. Being overweight can make your knee hurt more.  Keep all follow-up visits as told by your doctor. This is important. Contact a doctor if:  The knee pain does not stop.  The knee pain changes or gets worse.  You have a fever along with knee pain.  Your knee feels warm when you touch it.  Your knee gives out or locks up. Get help right away if:  Your knee swells, and the swelling gets worse.  You cannot move your knee.  You have very bad knee pain. Summary  Many things can cause knee pain. The pain often goes away on its own with time and rest.  Your doctor may do tests to find out the cause of the pain.  Pay attention to any changes in your symptoms. Relieve  your pain with rest, medicines, light activity, and use of ice.  Get help right away if you cannot move your knee or your knee pain is very bad. This information is not intended to replace advice given to you by your health care provider. Make sure you discuss any questions you have with your health care provider. Document Revised: 10/16/2017 Document Reviewed: 10/16/2017 Elsevier Patient Education  2020 [employer].

## 2020-03-28 NOTE — Assessment & Plan Note (Signed)
Acute right knee pain due to strain with twisting on 10/14.  No current swelling, tenderness on medial knee below the joint line. -Recommend NSAIDs as needed -Recommend mild compression sleeve, patient handout given -Return to work with limitations letter -F/u in 2 to 4 weeks if not resolved, sooner if worsening

## 2020-03-28 NOTE — Progress Notes (Signed)
    SUBJECTIVE:   CHIEF COMPLAINT / HPI:   Knee pain: Right knee. On oct 14th twisted it, then fell and injured the knee further on the 17th "heard a pop". She was unable to walk on it initially and had to be helped up off of the ground. No pain now, just swollen with limited ROM, difficulty sleeping on the leg. 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 Not taking anything for the knee and did originally took Tylenol with some improvement.   PERTINENT  PMH / PSH: Reviewed  OBJECTIVE:   BP 120/80   Pulse 88   Ht 5\' 7"  (1.702 m)   Wt 156 lb (70.8 kg)   SpO2 99%   BMI 24.43 kg/m   Gen: well-appearing, NAD MSK: Tender to palpation below the medial right knee joint, pain with ROM in most directions, more with exaggerated knee flexion and immediately stress.  No obvious swelling when compared to left knee.  Crepitus noted in right knee as well as a mild click/pop with repetitive knee extension/flexion.  ASSESSMENT/PLAN:   Acute pain of right knee Acute right knee pain due to strain with twisting on 10/14.  No current swelling, tenderness on medial knee below the joint line. -Recommend NSAIDs as needed -Recommend mild compression sleeve, patient handout given -Return to work with limitations letter -F/u in 2 to 4 weeks if not resolved, sooner if worsening     11/14, DO [place] [facility]

## 2020-04-19 ENCOUNTER — Ambulatory Visit (INDEPENDENT_AMBULATORY_CARE_PROVIDER_SITE_OTHER): Payer: Medicaid Other | Admitting: Family Medicine

## 2020-04-19 ENCOUNTER — Encounter: Payer: Self-pay | Admitting: Family Medicine

## 2020-04-19 ENCOUNTER — Other Ambulatory Visit (HOSPITAL_COMMUNITY)
Admission: RE | Admit: 2020-04-19 | Discharge: 2020-04-19 | Disposition: A | Discharge status: Home / Self Care | Payer: Medicaid Other | Source: Ambulatory Visit | Attending: Family Medicine | Admitting: Family Medicine

## 2020-04-19 ENCOUNTER — Other Ambulatory Visit: Payer: Self-pay

## 2020-04-19 VITALS — BP 120/80 | HR 112 | Ht 67.0 in | Wt 156.0 lb

## 2020-04-19 DIAGNOSIS — Z113 Encounter for screening for infections with a predominantly sexual mode of transmission: Secondary | ICD-10-CM

## 2020-04-19 DIAGNOSIS — M25561 Pain in right knee: Secondary | ICD-10-CM

## 2020-04-19 LAB — POCT WET PREP (WET MOUNT): Clue Cells Wet Prep Whiff POC: POSITIVE

## 2020-04-19 MED ORDER — METRONIDAZOLE 500 MG PO TABS: 500.0000 mg | ORAL_TABLET | Freq: Two times a day (BID) | ORAL | 0 refills | Status: DC

## 2020-04-19 NOTE — Patient Instructions (Addendum)
Thank you for coming in to see [place] today! Please see below to review our plan for today's visit:  1. Call us tomorrow morning and set up an appt for 2 weeks.  2. I have a feeling you have torn your medial meniscus. Please do the attached exercises at least once daily. Take Aleve 1 tablet every morning and night (about 12 hours apart) and Tylenol 500mg  three times daily (once in the AM with Aleve, again around 1 pm, then again around bedtime with your 2nd dose of Aleve). This will reduce the inflammation causing your knee dysfunction.  3. For your exercises - do each of them 3 times, hold for 15 seconds each.    Please call the clinic at [phone] if your symptoms worsen or you have any concerns. It was our pleasure to serve you!   Dr. [phone] [facility]   Journal for Nurse Practitioners, 15(4), [phone]. Retrieved February 23, 2018 from http://clinicalkey.com/nursing">  Knee Exercises Ask your health care provider which exercises are safe for you. Do exercises exactly as told by your health care provider and adjust them as directed. It is normal to feel mild stretching, pulling, tightness, or discomfort as you do these exercises. Stop right away if you feel sudden pain or your pain gets worse. Do not begin these exercises until told by your health care provider. Stretching and range-of-motion exercises These exercises warm up your muscles and joints and improve the movement and flexibility of your knee. These exercises also help to relieve pain and swelling. Knee extension, prone 1. Lie on your abdomen (prone position) on a bed. 2. Place your left / right knee just beyond the edge of the surface so your knee is not on the bed. You can put a towel under your left / right thigh just above your kneecap for comfort. 3. Relax your leg muscles and allow gravity to straighten your knee (extension). You should feel a stretch behind your left / right knee. 4. Hold this  position for __________ seconds. 5. Scoot up so your knee is supported between repetitions. Repeat __________ times. Complete this exercise __________ times a day. Knee flexion, active  1. Lie on your back with both legs straight. If this causes back discomfort, bend your left / right knee so your foot is flat on the floor. 2. Slowly slide your left / right heel back toward your buttocks. Stop when you feel a gentle stretch in the front of your knee or thigh (flexion). 3. Hold this position for __________ seconds. 4. Slowly slide your left / right heel back to the starting position. Repeat __________ times. Complete this exercise __________ times a day. Quadriceps stretch, prone  1. Lie on your abdomen on a firm surface, such as a bed or padded floor. 2. Bend your left / right knee and hold your ankle. If you cannot reach your ankle or pant leg, loop a belt around your foot and grab the belt instead. 3. Gently pull your heel toward your buttocks. Your knee should not slide out to the side. You should feel a stretch in the front of your thigh and knee (quadriceps). 4. Hold this position for __________ seconds. Repeat __________ times. Complete this exercise __________ times a day. Hamstring, supine 1. Lie on your back (supine position). 2. Loop a belt or towel over the ball of your left / right foot. The ball of your foot is on the walking surface, right under your toes. 3. Straighten  your left / right knee and slowly pull on the belt to raise your leg until you feel a gentle stretch behind your knee (hamstring). ? Do not let your knee bend while you do this. ? Keep your other leg flat on the floor. 4. Hold this position for __________ seconds. Repeat __________ times. Complete this exercise __________ times a day. Strengthening exercises These exercises build strength and endurance in your knee. Endurance is the ability to use your muscles for a long time, even after they get  tired. Quadriceps, isometric This exercise stretches the muscles in front of your thigh (quadriceps) without moving your knee joint (isometric). 1. Lie on your back with your left / right leg extended and your other knee bent. Put a rolled towel or small pillow under your knee if told by your health care provider. 2. Slowly tense the muscles in the front of your left / right thigh. You should see your kneecap slide up toward your hip or see increased dimpling just above the knee. This motion will push the back of the knee toward the floor. 3. For __________ seconds, hold the muscle as tight as you can without increasing your pain. 4. Relax the muscles slowly and completely. Repeat __________ times. Complete this exercise __________ times a day. Straight leg raises This exercise stretches the muscles in front of your thigh (quadriceps) and the muscles that move your hips (hip flexors). 1. Lie on your back with your left / right leg extended and your other knee bent. 2. Tense the muscles in the front of your left / right thigh. You should see your kneecap slide up or see increased dimpling just above the knee. Your thigh may even shake a bit. 3. Keep these muscles tight as you raise your leg 4-6 inches (10-15 cm) off the floor. Do not let your knee bend. 4. Hold this position for __________ seconds. 5. Keep these muscles tense as you lower your leg. 6. Relax your muscles slowly and completely after each repetition. Repeat __________ times. Complete this exercise __________ times a day. Hamstring, isometric 1. Lie on your back on a firm surface. 2. Bend your left / right knee about __________ degrees. 3. Dig your left / right heel into the surface as if you are trying to pull it toward your buttocks. Tighten the muscles in the back of your thighs (hamstring) to "dig" as hard as you can without increasing any pain. 4. Hold this position for __________ seconds. 5. Release the tension gradually and  allow your muscles to relax completely for __________ seconds after each repetition. Repeat __________ times. Complete this exercise __________ times a day. Hamstring curls If told by your health care provider, do this exercise while wearing ankle weights. Begin with __________ lb weights. Then increase the weight by 1 lb (0.5 kg) increments. Do not wear ankle weights that are more than __________ lb. 1. Lie on your abdomen with your legs straight. 2. Bend your left / right knee as far as you can without feeling pain. Keep your hips flat against the floor. 3. Hold this position for __________ seconds. 4. Slowly lower your leg to the starting position. Repeat __________ times. Complete this exercise __________ times a day. Squats This exercise strengthens the muscles in front of your thigh and knee (quadriceps). 1. Stand in front of a table, with your feet and knees pointing straight ahead. You may rest your hands on the table for balance but not for support. 2. Slowly bend  your knees and lower your hips like you are going to sit in a chair. ? Keep your weight over your heels, not over your toes. ? Keep your lower legs upright so they are parallel with the table legs. ? Do not let your hips go lower than your knees. ? Do not bend lower than told by your health care provider. ? If your knee pain increases, do not bend as low. 3. Hold the squat position for __________ seconds. 4. Slowly push with your legs to return to standing. Do not use your hands to pull yourself to standing. Repeat __________ times. Complete this exercise __________ times a day. Wall slides This exercise strengthens the muscles in front of your thigh and knee (quadriceps). 1. Lean your back against a smooth wall or door, and walk your feet out 18-24 inches (46-61 cm) from it. 2. Place your feet hip-width apart. 3. Slowly slide down the wall or door until your knees bend __________ degrees. Keep your knees over your heels,  not over your toes. Keep your knees in line with your hips. 4. Hold this position for __________ seconds. Repeat __________ times. Complete this exercise __________ times a day. Straight leg raises This exercise strengthens the muscles that rotate the leg at the hip and move it away from your body (hip abductors). 1. Lie on your side with your left / right leg in the top position. Lie so your head, shoulder, knee, and hip line up. You may bend your bottom knee to help you keep your balance. 2. Roll your hips slightly forward so your hips are stacked directly over each other and your left / right knee is facing forward. 3. Leading with your heel, lift your top leg 4-6 inches (10-15 cm). You should feel the muscles in your outer hip lifting. ? Do not let your foot drift forward. ? Do not let your knee roll toward the ceiling. 4. Hold this position for __________ seconds. 5. Slowly return your leg to the starting position. 6. Let your muscles relax completely after each repetition. Repeat __________ times. Complete this exercise __________ times a day. Straight leg raises This exercise stretches the muscles that move your hips away from the front of the pelvis (hip extensors). 1. Lie on your abdomen on a firm surface. You can put a pillow under your hips if that is more comfortable. 2. Tense the muscles in your buttocks and lift your left / right leg about 4-6 inches (10-15 cm). Keep your knee straight as you lift your leg. 3. Hold this position for __________ seconds. 4. Slowly lower your leg to the starting position. 5. Let your leg relax completely after each repetition. Repeat __________ times. Complete this exercise __________ times a day. This information is not intended to replace advice given to you by your health care provider. Make sure you discuss any questions you have with your health care provider. Document Revised: 02/24/2018 Document Reviewed: 02/24/2018 Elsevier Patient  Education  2020 [employer].

## 2020-04-19 NOTE — Progress Notes (Signed)
SUBJECTIVE:   CHIEF COMPLAINT / HPI:   R knee pain: Patient seen in the clinic on 03/28/2020 for right knee pain.  Patient stated on October 14 she twisted her right knee, then on the 17th that she fell and injured the knee, "heard a pop".  She was unable to walk on it initially and had to be helped off the ground.  She denies any pain now, reports it swollen and has limited range of motion.  Difficulty with sleeping on the leg.  She has not been able to work.  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  She was not taking anything for the knee at the time of the appointment, but had taken some Tylenol with some improvement.  Physician at the time recommended NSAIDs as needed, mild compression sleeve to decrease swelling, and follow-up in 2-4 weeks if pain not resolved.  Today patient reports the swelling is better but continues.  Also reports that she continues to have pain in the middle part (medial aspect) of her right knee.  Denies any other traumas or falls.  Has taken occasional Tylenol or ibuprofen to help with pain but has not been on a consistent regimen.  STI Check: Patient would like to be checked for STIs today.  Denies any symptoms such as vaginal discharge, abnormal bleeding, pelvic pain, itch or odor.    PERTINENT  PMH / PSH:  Patient Active Problem List   Diagnosis Date Noted  . Acute pain of right knee 03/28/2020  . Nexplanon in place 08/26/2019  . Elevated blood pressure reading without diagnosis of hypertension 07/03/2018  . Abnormal TSH 02/26/2018  . Pap smear abnormality of cervix/human papillomavirus (HPV) positive 02/26/2018  . Routine screening for STI (sexually transmitted infection) 09/18/2016  . Contraception management 03/15/2011  . Tobacco use disorder 09/12/2008     OBJECTIVE:   BP 120/80   Pulse (!) 112   Ht 5\' 7"  (1.702 m)   Wt 156 lb (70.8 kg)   LMP 03/03/2020 (Approximate)   SpO2 98%    BMI 24.43 kg/m    Physical exam: General: Pleasant patient, occasionally becomes flustered when talking about the potential of an STI Respiratory: Comfortable work of breathing, speaking complete sentences GU: No external labial lesions or erythema, normal-appearing vaginal rugae with scant white discharge, cervix normal in appearance without lesion, os closed, no bleeding Right knee: - Inspection: Mild swelling appreciated, no erythema or bruising, skin intact - Palpation: TTP appreciated along medial joint line - ROM: Reduced active ROM in flexion and extension in knee - Strength: 5/5 strength - Neuro/vasc: NV intact - Special Tests: - LIGAMENTS: negative anterior and posterior drawer  -- MENISCUS: Positive Apley's, positive Thessaly  -- PF JOINT: nml patellar mobility bilaterally.  negative patellar grind, negative patellar apprehension  ASSESSMENT/PLAN:   Acute pain of right knee Likely secondary to medial meniscus tear as suggested by positive Apley and Thessaly tests.  Some acute swelling appreciated medially.  Some tenderness to palpation along medial joint line. -Patient given home exercises to perform to strengthen her knee -perform exercises daily, do each exercise 3 times, hold for 15 seconds -Continue to wear knee compression sleeve -Tylenol 500 mg 3 times daily with Aleve 1 tablet twice daily -Asked to follow-up in about 2 weeks if not better -Can consider formal physical therapy, joint injection  Routine screening for STI (sexually transmitted infection) Patient noted to have trichomonas on  wet prep, also found to have bacterial vaginosis. -Patient given prescription for metronidazole 500 mg twice daily x7 days -Patient has Nexplanon inserted since April 2021   Dollene Cleveland, DO [facility]

## 2020-04-21 ENCOUNTER — Encounter: Payer: Self-pay | Admitting: Family Medicine

## 2020-04-21 LAB — CERVICOVAGINAL ANCILLARY ONLY
Chlamydia: NEGATIVE
Comment: NEGATIVE
Comment: NORMAL
Neisseria Gonorrhea: NEGATIVE

## 2020-04-23 ENCOUNTER — Other Ambulatory Visit: Payer: Self-pay | Admitting: Family Medicine

## 2020-04-23 ENCOUNTER — Telehealth: Payer: Self-pay | Admitting: Family Medicine

## 2020-04-23 MED ORDER — METRONIDAZOLE 500 MG PO TABS: 500.0000 mg | ORAL_TABLET | Freq: Two times a day (BID) | ORAL | 0 refills | Status: AC

## 2020-04-23 NOTE — Telephone Encounter (Signed)
Received OOH from patient saying she has "lost" her trichomonas medicine. She collected it 2 days ago but lost it in her car and now she is "very irritated". Refilled metronidazole 500mg  BID for her and advised she takes the full course of it.  MD  PGY-2, [facility]

## 2020-04-26 ENCOUNTER — Encounter: Payer: Self-pay | Admitting: Family Medicine

## 2020-04-26 NOTE — Assessment & Plan Note (Addendum)
Likely secondary to medial meniscus tear as suggested by positive Apley and Thessaly tests.  Some acute swelling appreciated medially.  Some tenderness to palpation along medial joint line. -Patient given home exercises to perform to strengthen her knee -perform exercises daily, do each exercise 3 times, hold for 15 seconds -Continue to wear knee compression sleeve -Tylenol 500 mg 3 times daily with Aleve 1 tablet twice daily -Asked to follow-up in about 2 weeks if not better -Can consider formal physical therapy, joint injection

## 2020-04-26 NOTE — Assessment & Plan Note (Signed)
Patient noted to have trichomonas on wet prep, also found to have bacterial vaginosis. -Patient given prescription for metronidazole 500 mg twice daily x7 days

## 2020-07-01 ENCOUNTER — Emergency Department (HOSPITAL_COMMUNITY): Payer: Medicaid Other

## 2020-07-01 ENCOUNTER — Emergency Department (HOSPITAL_COMMUNITY)
Admission: EM | Admit: 2020-07-01 | Discharge: 2020-07-02 | Disposition: A | Discharge status: Home / Self Care | Payer: Medicaid Other | Attending: Emergency Medicine | Admitting: Emergency Medicine

## 2020-07-01 ENCOUNTER — Other Ambulatory Visit: Payer: Self-pay

## 2020-07-01 ENCOUNTER — Encounter (HOSPITAL_COMMUNITY): Payer: Self-pay

## 2020-07-01 DIAGNOSIS — S0083XA Contusion of other part of head, initial encounter: Secondary | ICD-10-CM | POA: Insufficient documentation

## 2020-07-01 DIAGNOSIS — T1490XA Injury, unspecified, initial encounter: Secondary | ICD-10-CM

## 2020-07-01 DIAGNOSIS — S0990XA Unspecified injury of head, initial encounter: Secondary | ICD-10-CM | POA: Diagnosis present

## 2020-07-01 DIAGNOSIS — Y92410 Unspecified street and highway as the place of occurrence of the external cause: Secondary | ICD-10-CM | POA: Diagnosis not present

## 2020-07-01 DIAGNOSIS — F17210 Nicotine dependence, cigarettes, uncomplicated: Secondary | ICD-10-CM | POA: Insufficient documentation

## 2020-07-01 LAB — I-STAT CHEM 8, ED
BUN: 6 mg/dL (ref 6–20)
Calcium, Ion: 1.08 mmol/L — ABNORMAL LOW (ref 1.15–1.40)
Chloride: 111 mmol/L (ref 98–111)
Creatinine, Ser: 0.9 mg/dL (ref 0.44–1.00)
Glucose, Bld: 50 mg/dL — ABNORMAL LOW (ref 70–99)
HCT: 38 % (ref 36.0–46.0)
Hemoglobin: 12.9 g/dL (ref 12.0–15.0)
Potassium: 3.6 mmol/L (ref 3.5–5.1)
Sodium: 145 mmol/L (ref 135–145)
TCO2: 20 mmol/L — ABNORMAL LOW (ref 22–32)

## 2020-07-01 LAB — I-STAT BETA HCG BLOOD, ED (MC, WL, AP ONLY): I-stat hCG, quantitative: <5 m[IU]/mL (ref <5)

## 2020-07-01 NOTE — ED Notes (Signed)
Pt called X1 for vitals

## 2020-07-01 NOTE — ED Notes (Signed)
Pt called x3 for vitals with no response.
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
## 2020-07-01 NOTE — ED Notes (Signed)
Son, [phone] , would like to speak to mom
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
## 2020-07-04 ENCOUNTER — Telehealth: Payer: Self-pay
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
## 2020-07-05 ENCOUNTER — Ambulatory Visit: Payer: Medicaid Other | Admitting: Student in an Organized Health Care Education/Training Program

## 2020-08-28 ENCOUNTER — Ambulatory Visit: Payer: Medicaid Other | Admitting: Family Medicine

## 2020-08-28 ENCOUNTER — Other Ambulatory Visit (HOSPITAL_COMMUNITY)
Admission: RE | Admit: 2020-08-28 | Discharge: 2020-08-28 | Disposition: A | Discharge status: Home / Self Care | Payer: Medicaid Other | Source: Ambulatory Visit | Attending: Family Medicine | Admitting: Family Medicine

## 2020-08-28 ENCOUNTER — Other Ambulatory Visit: Payer: Self-pay

## 2020-08-28 VITALS — BP 125/88 | HR 94 | Ht 67.0 in | Wt 141.0 lb

## 2020-08-28 DIAGNOSIS — Z113 Encounter for screening for infections with a predominantly sexual mode of transmission: Secondary | ICD-10-CM | POA: Diagnosis present

## 2020-08-28 DIAGNOSIS — N898 Other specified noninflammatory disorders of vagina: Secondary | ICD-10-CM | POA: Diagnosis present

## 2020-08-28 NOTE — Progress Notes (Signed)
   SUBJECTIVE:   CHIEF COMPLAINT / HPI:      Erika Porter is a 35 y.o. female here for STD testing. Denies symptoms.   PERTINENT  PMH / PSH: reviewed and updated as appropriate   OBJECTIVE:   BP 125/88   Pulse 94   Ht 5\' 7"  (1.702 m)   Wt 141 lb (64 kg)   LMP 06/20/2020   SpO2 99%   BMI 22.08 kg/m    GEN: well appearing female in no acute distress  CVS: well perfused  RESP: speaking in full sentences without pause  Pelvic exam: normal external genitalia, vulva, VAGINA and CERVIX: normal appearing cervix without discharge or lesions, ADNEXA: normal adnexa in size, nontender and no masses, exam chaperoned by CMA Tashira.    ASSESSMENT/PLAN:   Encounter for screening examination for sexually transmitted disease Patient asymptomatic. GC/CT, yeast, trich, BV collected per request. Declined HIV and RPR.      08/18/2020, DO PGY-2,  Family Medicine 08/28/2020

## 2020-08-29 ENCOUNTER — Encounter: Payer: Self-pay | Admitting: Family Medicine

## 2020-08-29 LAB — CERVICOVAGINAL ANCILLARY ONLY
Bacterial Vaginitis (gardnerella): NEGATIVE
Candida Glabrata: NEGATIVE
Candida Vaginitis: NEGATIVE
Chlamydia: NEGATIVE
Comment: NEGATIVE
Comment: NEGATIVE
Comment: NEGATIVE
Comment: NEGATIVE
Comment: NEGATIVE
Comment: NORMAL
Neisseria Gonorrhea: NEGATIVE
Trichomonas: NEGATIVE

## 2020-08-29 NOTE — Progress Notes (Signed)
Result letter

## 2020-08-31 NOTE — Assessment & Plan Note (Addendum)
Patient asymptomatic. GC/CT, yeast, trich, BV collected per request. Declined HIV and RPR. Has a Nexplanon.

## 2021-01-24 NOTE — Progress Notes (Signed)
    SUBJECTIVE:   CHIEF COMPLAINT / HPI: breast boil    Patient presents for boil on her bilateral breasts return.  Patient reports that she has intermittent "boils" that occur on her breast and in her axilla and in her groin region.  She reports that the most recent one was on her left breast in central areola.  She states that they are tender to touch and often feels with fluid.  She reports that she recently ruptured the cyst and it drained green and whitish fluid.  Patient states that she is not lactating and has not had any fevers or chills recently.  Patient states that she wanted to have this checked is she has a family history of breast cancer.  PERTINENT  PMH / PSH:    OBJECTIVE:   BP 112/66   Pulse 92   LMP 01/21/2021   SpO2 100%   General: female appearing stated age in no acute distress Breast: chaperone present Bilateral breasts with no palpable masses outside of areola, left breast has shallow 3 mm area of pink ulcerated base  ASSESSMENT/PLAN:   Folliculitis Prescribed Bactroban BID  Discussed return precautions, patient voiced understanding      Ronnald Ramp, MD [facility]

## 2021-01-25 ENCOUNTER — Ambulatory Visit (INDEPENDENT_AMBULATORY_CARE_PROVIDER_SITE_OTHER): Payer: Medicaid Other | Admitting: Family Medicine

## 2021-01-25 ENCOUNTER — Other Ambulatory Visit: Payer: Self-pay

## 2021-01-25 DIAGNOSIS — L739 Follicular disorder, unspecified: Secondary | ICD-10-CM

## 2021-01-25 HISTORY — DX: Follicular disorder, unspecified: L73.9

## 2021-01-25 MED ORDER — MUPIROCIN CALCIUM 2 % EX CREA: TOPICAL_CREAM | CUTANEOUS | 0 refills | Status: DC

## 2021-01-25 NOTE — Patient Instructions (Signed)
It is likely that be bumps you are experiencing is due to some infected gland causing fluid to collect and then be expressed from these different bumps.  I have prescribed a topical ointment for you to apply twice daily to the area where you have these bumps occur.

## 2021-01-25 NOTE — Assessment & Plan Note (Signed)
Prescribed Bactroban BID  Discussed return precautions, patient voiced understanding

## 2021-06-07 ENCOUNTER — Ambulatory Visit (INDEPENDENT_AMBULATORY_CARE_PROVIDER_SITE_OTHER): Payer: Medicaid Other | Admitting: Family Medicine

## 2021-06-07 ENCOUNTER — Other Ambulatory Visit (HOSPITAL_COMMUNITY)
Admission: RE | Admit: 2021-06-07 | Discharge: 2021-06-07 | Disposition: A | Discharge status: Home / Self Care | Payer: Medicaid Other | Source: Ambulatory Visit | Attending: Family Medicine | Admitting: Family Medicine

## 2021-06-07 ENCOUNTER — Other Ambulatory Visit: Payer: Self-pay

## 2021-06-07 VITALS — BP 116/81 | HR 88 | Wt 148.4 lb

## 2021-06-07 DIAGNOSIS — N898 Other specified noninflammatory disorders of vagina: Secondary | ICD-10-CM | POA: Diagnosis not present

## 2021-06-07 DIAGNOSIS — Z124 Encounter for screening for malignant neoplasm of cervix: Secondary | ICD-10-CM

## 2021-06-07 DIAGNOSIS — R87618 Other abnormal cytological findings on specimens from cervix uteri: Secondary | ICD-10-CM | POA: Diagnosis not present

## 2021-06-07 DIAGNOSIS — Z113 Encounter for screening for infections with a predominantly sexual mode of transmission: Secondary | ICD-10-CM | POA: Diagnosis not present

## 2021-06-07 DIAGNOSIS — Z3200 Encounter for pregnancy test, result unknown: Secondary | ICD-10-CM

## 2021-06-07 DIAGNOSIS — Z114 Encounter for screening for human immunodeficiency virus [HIV]: Secondary | ICD-10-CM | POA: Diagnosis not present

## 2021-06-07 DIAGNOSIS — B9689 Other specified bacterial agents as the cause of diseases classified elsewhere: Secondary | ICD-10-CM

## 2021-06-07 DIAGNOSIS — N760 Acute vaginitis: Secondary | ICD-10-CM | POA: Diagnosis not present

## 2021-06-07 LAB — POCT WET PREP (WET MOUNT)
Clue Cells Wet Prep Whiff POC: POSITIVE
Trichomonas Wet Prep HPF POC: ABSENT

## 2021-06-07 LAB — POCT URINE PREGNANCY: Preg Test, Ur: NEGATIVE

## 2021-06-07 MED ORDER — METRONIDAZOLE 500 MG PO TABS: 500.0000 mg | ORAL_TABLET | Freq: Two times a day (BID) | ORAL | 0 refills | Status: DC

## 2021-06-07 MED ORDER — FLUCONAZOLE 150 MG PO TABS
150.0000 mg | ORAL_TABLET | Freq: Once | ORAL | 0 refills | Status: AC
Start: 2021-06-07 — End: 2021-06-07

## 2021-06-07 NOTE — Patient Instructions (Addendum)
It was a pleasure to see you today!   We will get some labs today.  If they are abnormal or we need to do something about them, I will call you.  If they are normal, I will send you a message on MyChart (if it is active) or a letter in the mail.  If you don't hear from [place] in 2 weeks, please call the office  [phone]. In case you have a yeast infection, I will send a prescription for fluconazole. Take one dose one time.   Be Well,  Dr. Leary Roca

## 2021-06-07 NOTE — Assessment & Plan Note (Signed)
Obtained pap smear today with HPV.

## 2021-06-07 NOTE — Progress Notes (Signed)
° ° °  SUBJECTIVE:   CHIEF COMPLAINT / HPI:   Vaginal Discharge: Patient is a 36 y.o. female presenting with vaginal discharge for several days- she cannot exactly recall.  She also reports itching, but denies vaginal odor.  She is interested in screening for sexually transmitted infections today. She has a nexplanon. Will add pap smear as last one was in 2018.  PERTINENT  PMH / PSH: None relevant  OBJECTIVE:   BP 116/81    Pulse 88    Wt 148 lb 6.4 oz (67.3 kg)    SpO2 99%    BMI 23.24 kg/m    General: NAD, pleasant, able to participate in exam Respiratory: Normal effort, no obvious respiratory distress Pelvic: VULVA: normal appearing vulva with no masses, tenderness or lesions, VAGINA: Normal appearing vagina with normal color, no lesions, with scant discharge present, CERVIX: No lesions, scant and white discharge present  ASSESSMENT/PLAN:   Vaginitis and vulvovaginitis 36 y.o. female with vaginal discharge for several days, as well as pruritus.  Physical exam significant for scant white discharge.  Wet prep performed today shows clue cells and + whiff test consistent with BV.  Patient is interested in STI screening.   Plan: -Wet prep as above.  Will treat with flagyl. -GC/chlamydia pending -Will check HIV and RPR  Pap smear abnormality of cervix/human papillomavirus (HPV) positive Obtained pap smear today with HPV.    Shirlean Mylar, MD [facility]

## 2021-06-07 NOTE — Assessment & Plan Note (Signed)
36 y.o. female with vaginal discharge for several days, as well as pruritus.  Physical exam significant for scant white discharge.  Wet prep performed today shows clue cells and + whiff test consistent with BV.  Patient is interested in STI screening.   Plan: -Wet prep as above.  Will treat with flagyl. -GC/chlamydia pending -Will check HIV and RPR

## 2021-06-08 LAB — HIV ANTIBODY (ROUTINE TESTING W REFLEX): HIV Screen 4th Generation wRfx: NONREACTIVE

## 2021-06-08 LAB — RPR: RPR Ser Ql: NONREACTIVE

## 2021-06-11 ENCOUNTER — Telehealth: Payer: Self-pay | Admitting: Family Medicine

## 2021-06-11 ENCOUNTER — Encounter: Payer: Self-pay | Admitting: Family Medicine

## 2021-06-11 LAB — CYTOLOGY - PAP
Chlamydia: NEGATIVE
Comment: NEGATIVE
Comment: NEGATIVE
Comment: NORMAL
Diagnosis: NEGATIVE
High risk HPV: NEGATIVE
Neisseria Gonorrhea: NEGATIVE

## 2021-06-11 NOTE — Telephone Encounter (Signed)
Pap smear NILM, HPV negative; she will not need a repeat until 2028. GC negative as well. Attempted to call patient to inform her of results, but pt did not answer and no VM. Will send letter of normal results.  Shirlean Mylar, MD [facility], PGY-3

## 2021-08-27 ENCOUNTER — Ambulatory Visit (INDEPENDENT_AMBULATORY_CARE_PROVIDER_SITE_OTHER): Payer: Medicaid Other | Admitting: Family Medicine

## 2021-08-27 NOTE — Progress Notes (Signed)
Patient scheduled for appointment today.  However when discussed with her she states that she does not think she has an appointment today and is simply here for her child (also has an appointment today).  She states that she typically has appointments with a female provider and will plan to reschedule if needed in the future. ?

## 2021-08-27 NOTE — Progress Notes (Deleted)
? ? ?  SUBJECTIVE:  ? ?CHIEF COMPLAINT / HPI:  ? ?Vaginal Discharge: ?Patient is a 36 y.o. female presenting with vaginal discharge for *** days.  She states the discharge is of *** consistency.  She endorses *** vaginal odor.  She is interested in screening for sexually transmitted infections today. ? ?PERTINENT  PMH / PSH: ***None relevant ? ?OBJECTIVE:  ? ?There were no vitals taken for this visit.  ? ?General: NAD, pleasant, able to participate in exam ?Respiratory: Normal effort, no obvious respiratory distress ?Pelvic: VULVA: normal appearing vulva with no masses, tenderness or lesions, VAGINA: Normal appearing vagina with normal color, no lesions, with {GYN VAGINAL DISCHARGE:21986} discharge present, ***CERVIX: No lesions, {GYN VAGINAL DISCHARGE:21986} discharge present, ? ?Chaperone *** present for pelvic exam ? ?ASSESSMENT/PLAN:  ? ?No problem-specific Assessment & Plan notes found for this encounter. ?  ? ?Assessment:  ?36 y.o. female with vaginal discharge for***days, as well as***.  Physical exam significant for*** discharge.  Wet prep performed today shows *** consistent with ***.  Patient is interested in STI screening.   ?Plan: ?-Wet prep as above.  Will treat with***. ?-GC/chlamydia pending ?-Will check HIV and RPR ? ?Jackelyn Poling, DO ?[facility]  ?

## 2021-08-29 ENCOUNTER — Telehealth: Payer: Self-pay | Admitting: Family Medicine

## 2021-08-29 ENCOUNTER — Encounter: Payer: Self-pay | Admitting: Family Medicine

## 2021-08-29 ENCOUNTER — Ambulatory Visit (INDEPENDENT_AMBULATORY_CARE_PROVIDER_SITE_OTHER): Payer: Medicaid Other | Admitting: Family Medicine

## 2021-08-29 VITALS — BP 105/71 | HR 81 | Wt 146.2 lb

## 2021-08-29 DIAGNOSIS — K5900 Constipation, unspecified: Secondary | ICD-10-CM | POA: Diagnosis not present

## 2021-08-29 DIAGNOSIS — K625 Hemorrhage of anus and rectum: Secondary | ICD-10-CM

## 2021-08-29 DIAGNOSIS — D649 Anemia, unspecified: Secondary | ICD-10-CM | POA: Diagnosis not present

## 2021-08-29 LAB — HEMOCCULT GUIAC POC 1CARD (OFFICE): Fecal Occult Blood, POC: NEGATIVE

## 2021-08-29 MED ORDER — POLYETHYLENE GLYCOL 3350 17 GM/SCOOP PO POWD
17.0000 g | Freq: Two times a day (BID) | ORAL | 1 refills | Status: AC | PRN
Start: 2021-08-29 — End: ?

## 2021-08-29 NOTE — Progress Notes (Signed)
? ? ?  SUBJECTIVE:  ? ?CHIEF COMPLAINT / HPI:  ? ?Bright red blood per rectum ?- happened twice, five days ago and then two days ago ?- cannot remember if constipated or hard stool ?- never happened before ?- only on wiping ?- no blood in stool or urine ?- no blood from vagina, nexplanon in place, still having irregular periods, LMP early January, only has period if stressed ?- [id number], youngest baby is 36 years old ?- no known hemorrhoids, no weight loss ?- some night sweats, fatigue, and difficulty sleeping, wonders if they are hot flashes ?- last CBC 06/07/2019, Hgb 10.6 ? ?PERTINENT  PMH / PSH: Tobacco use, abnormal Pap smear, Nexplanon in place ? ?OBJECTIVE:  ? ?BP 105/71   Pulse 81   Wt 146 lb 3.2 oz (66.3 kg)   LMP 08/29/2021   SpO2 100%   BMI 22.90 kg/m?   ? ?Physical Exam ?General: Awake, alert, oriented, no acute distress ?Respiratory: Normal work of breathing, no respiratory distress ?Neuro: Cranial nerves II through X grossly intact, able to move all extremities spontaneously ?Anus: deflated hemorrhoids at 6 o clock position, normal appearing anus, normal sphincter tone ? ?ASSESSMENT/PLAN:  ? ?Blood per rectum ?Acute, intermittent.  Two total episodes in last 5 days, unknown if concurrent constipation or hard stool.  Clinic FOBT negative, physical exam reveals hemorrhoid at 6 o'clock position.  CBC shows stable hemoglobin at normal level, no anemia.  Given likely explanation of hemorrhoid, no further investigation required at this time.  Recommend conservative management including stool softening measures and topical application of OTC cream such as Preparation H.  See AVS for more. ?  ? ? ?Fayette Pho, MD ?[facility]  ?

## 2021-08-29 NOTE — Telephone Encounter (Signed)
Called patient to discuss negative FOBT. Phone went straight to VM. Left HIPAA safe VM.  Given presence of deflated external hemorrhoids and negative FOBT from exam today, no need at this time for GI referral. ? ?We will await CBC results, will follow. ? ?Fayette Pho, MD ? ?

## 2021-08-29 NOTE — Patient Instructions (Addendum)
It was wonderful to see you today. Thank you for allowing me to be a part of your care. Below is a short summary of what we discussed at your visit today: ? ?Blood from rectum ?Today your physical exam showed small deflated hemorrhoids.   ? ?We collected a stool test. I will tell you the result of this test before you leave.  ? ?We also collected blood work to see how your hemoglobin is doing. That result should be back in 2-3 days. If the results are normal, I will send you a letter or MyChart message. If the results are abnormal, I will give you a call.   ? ?Focus on having soft, easy to pass stools. Try adding fiber to your diet or using miralax daily if your stools are hard.  ? ?Cooking and Nutrition Classes ?The [employer] in [place] provides many classes at low or no cost to [employer], nutrition, and agriculture.  Their website offers a huge variety of information related to topics such as gardening, nutrition, cooking, parenting, and health.  Also listed are classes and events, both online and in-person.  Check out their website here: [email or web address]  ? ?Food finder app ?Download the [employer] App or Call 211 to easily find food banks and pantries and other resources nearby.  ? ?Please bring all of your medications to every appointment! ? ?If you have any questions or concerns, please do not hesitate to contact us via phone or MyChart message.  ? ?Fayette Pho, MD  ?

## 2021-08-30 ENCOUNTER — Encounter: Payer: Self-pay | Admitting: Family Medicine

## 2021-08-30 DIAGNOSIS — K625 Hemorrhage of anus and rectum: Secondary | ICD-10-CM | POA: Insufficient documentation

## 2021-08-30 LAB — CBC
Hematocrit: 34.8 % (ref 34.0–46.6)
Hemoglobin: 11.5 g/dL (ref 11.1–15.9)
MCH: 26.7 pg (ref 26.6–33.0)
MCHC: 33 g/dL (ref 31.5–35.7)
MCV: 81 fL (ref 79–97)
Platelets: 277 10*3/uL (ref 150–450)
RBC: 4.3 x10E6/uL (ref 3.77–5.28)
RDW: 13.5 % (ref 11.7–15.4)
WBC: 7.5 10*3/uL (ref 3.4–10.8)

## 2021-08-30 NOTE — Assessment & Plan Note (Signed)
Acute, intermittent.  Two total episodes in last 5 days, unknown if concurrent constipation or hard stool.  Clinic FOBT negative, physical exam reveals hemorrhoid at 6 o'clock position.  CBC shows stable hemoglobin at normal level, no anemia.  Given likely explanation of hemorrhoid, no further investigation required at this time.  Recommend conservative management including stool softening measures and topical application of OTC cream such as Preparation H.  See AVS for more. ?

## 2021-10-30 ENCOUNTER — Other Ambulatory Visit (HOSPITAL_COMMUNITY)
Admission: RE | Admit: 2021-10-30 | Discharge: 2021-10-30 | Disposition: A | Discharge status: Home / Self Care | Payer: Medicaid Other | Source: Ambulatory Visit | Attending: Family Medicine | Admitting: Family Medicine

## 2021-10-30 ENCOUNTER — Encounter: Payer: Self-pay | Admitting: Family Medicine

## 2021-10-30 ENCOUNTER — Ambulatory Visit (INDEPENDENT_AMBULATORY_CARE_PROVIDER_SITE_OTHER): Payer: Medicaid Other | Admitting: Family Medicine

## 2021-10-30 VITALS — BP 110/89 | HR 72 | Wt 144.2 lb

## 2021-10-30 DIAGNOSIS — N939 Abnormal uterine and vaginal bleeding, unspecified: Secondary | ICD-10-CM | POA: Diagnosis not present

## 2021-10-30 DIAGNOSIS — N898 Other specified noninflammatory disorders of vagina: Secondary | ICD-10-CM | POA: Diagnosis not present

## 2021-10-30 DIAGNOSIS — Z113 Encounter for screening for infections with a predominantly sexual mode of transmission: Secondary | ICD-10-CM | POA: Insufficient documentation

## 2021-10-30 DIAGNOSIS — A599 Trichomoniasis, unspecified: Secondary | ICD-10-CM

## 2021-10-30 LAB — POCT WET PREP (WET MOUNT): Clue Cells Wet Prep Whiff POC: NEGATIVE

## 2021-10-30 MED ORDER — METRONIDAZOLE 500 MG PO TABS: 500.0000 mg | ORAL_TABLET | Freq: Two times a day (BID) | ORAL | 0 refills | Status: AC

## 2021-10-30 NOTE — Progress Notes (Signed)
    SUBJECTIVE:   CHIEF COMPLAINT / HPI:   Chief Complaint  Patient presents with   Vaginal Discharge    Erika Porter is a 36 y.o. female presents for   Vaginal Discharge Having vaginal discharge for about a month ago.  Discharge consistency: sticky Discharge color: clear Medications tried: no  No recent antibiotic use  No LMP recorded. (Menstrual status: Irregular Periods). Contraception: Nexplanon placed in 2021.  Possible STD exposure: No  Symptoms Fever: No Dysuria: No Vaginal bleeding: Currently menstruating  Abdomen or Pelvic pain: No Back pain: No Genital sores or ulcers: No Rash: No Pain during sex: No Odor : no      PERTINENT  PMH / PSH: reviewed and updated as appropriate   OBJECTIVE:   BP 110/89   Pulse 72   Wt 144 lb 4 oz (65.4 kg)   SpO2 100%   BMI 22.59 kg/m   GEN: well appearing female in no acute distress  CVS: well perfused  RESP: speaking in full sentences without pause  ABD: soft, non-tender, non-distended, no palpable masses  Pelvic exam: normal external genitalia, vulva, VAGINA and CERVIX: normal appearing cervix without discharge or lesions, ADNEXA: normal adnexa in size, nontender and no masses, WET MOUNT done - results: KOH done, trichomonads, DNA probe for chlamydia and GC obtained, exam chaperoned by CMA.     ASSESSMENT/PLAN:   No problem-specific Assessment & Plan notes found for this encounter.    Trichomonas  Confirmed on wet prep.  Pap up-to-date and was normal in January 2023. - Treatment: Flagyl 500 BID x 7 days and abstain from coitus during course of treatment. Advised patient to not drink alcohol while taking this medication.  - F/U if symptoms not improving or getting worse.  - Will f/u on G/C Chlamydia and call in Rx if positive.  - Hep C, HIV and RPR - F/U with PCP as needed.     Encounter for STI testing GC and chlamydia DNA  probe sent to lab. HIV, hep C and RPR collected. Advised patient to use barrier  protection/condoms.  Patient being treated for trichomonas as above.  Will need test of cure            Katha Cabal, DO [facility]

## 2021-10-30 NOTE — Patient Instructions (Signed)
Be sure to stop by the pharmacy to pick up your antibiotics. Do NOT drink alcohol as this medication can cause severe vomiting.   Retest in 3-6 weeks to be sure the infection clears up.

## 2021-10-31 LAB — CBC
Hematocrit: 36 % (ref 34.0–46.6)
Hemoglobin: 11.7 g/dL (ref 11.1–15.9)
MCH: 26.6 pg (ref 26.6–33.0)
MCHC: 32.5 g/dL (ref 31.5–35.7)
MCV: 82 fL (ref 79–97)
Platelets: 249 10*3/uL (ref 150–450)
RBC: 4.4 x10E6/uL (ref 3.77–5.28)
RDW: 13.3 % (ref 11.7–15.4)
WBC: 6.2 10*3/uL (ref 3.4–10.8)

## 2021-10-31 LAB — CERVICOVAGINAL ANCILLARY ONLY
Chlamydia: NEGATIVE
Comment: NEGATIVE
Comment: NEGATIVE
Comment: NORMAL
Neisseria Gonorrhea: NEGATIVE
Trichomonas: POSITIVE — AB

## 2021-10-31 LAB — RPR W/REFLEX TO TREPSURE: RPR: NONREACTIVE

## 2021-10-31 LAB — HEPATITIS C ANTIBODY: Hep C Virus Ab: NONREACTIVE

## 2021-10-31 LAB — HIV ANTIBODY (ROUTINE TESTING W REFLEX): HIV Screen 4th Generation wRfx: NONREACTIVE

## 2021-10-31 LAB — T PALLIDUM ANTIBODY, EIA: T pallidum Antibody, EIA: NEGATIVE

## 2021-11-06 ENCOUNTER — Encounter: Payer: Self-pay | Admitting: Family Medicine

## 2022-01-07 ENCOUNTER — Other Ambulatory Visit (HOSPITAL_COMMUNITY)
Admission: RE | Admit: 2022-01-07 | Discharge: 2022-01-07 | Disposition: A | Discharge status: Home / Self Care | Payer: Medicaid Other | Source: Ambulatory Visit | Attending: Family Medicine | Admitting: Family Medicine

## 2022-01-07 ENCOUNTER — Ambulatory Visit (INDEPENDENT_AMBULATORY_CARE_PROVIDER_SITE_OTHER): Payer: Medicaid Other | Admitting: Family Medicine

## 2022-01-07 VITALS — BP 104/62 | HR 90 | Ht 67.0 in | Wt 142.4 lb

## 2022-01-07 DIAGNOSIS — N898 Other specified noninflammatory disorders of vagina: Secondary | ICD-10-CM | POA: Insufficient documentation

## 2022-01-07 LAB — POCT WET PREP (WET MOUNT)
Clue Cells Wet Prep Whiff POC: POSITIVE
Trichomonas Wet Prep HPF POC: ABSENT

## 2022-01-07 NOTE — Patient Instructions (Addendum)
It was great seeing you today!  Today we discussed your vaginal discharge, we did testing for multiple vaginal infections. I will let you know of any abnormal results. Please make sure to use protection each time.   Please follow up at your next scheduled appointment, if anything arises between now and then, please don't hesitate to contact our office.   Thank you for allowing [place] to be a part of your medical care!  Thank you, Dr. Robyne Peers

## 2022-01-07 NOTE — Assessment & Plan Note (Signed)
-  wet prep and cervical ancillary testing pending -patient agreeable to vaginal testing but politely declines blood testing -up to date on PAP smear  -safe sex practices discussed

## 2022-01-07 NOTE — Progress Notes (Signed)
    SUBJECTIVE:   CHIEF COMPLAINT / HPI:   Patient presents with vaginal discharge and irritation for a few weeks. During her recent visit, she was positive for trichimonas and wants to make sure that this is resolved and wants to make sure that you have no other infection. Denies fever, chills, abdominal pain, pelvic pain and dysuria. LMP 8/10 which was regular. Sexually active with 1 female partner. She does not use protection.   OBJECTIVE:   BP 104/62   Pulse 90   Ht 5\' 7"  (1.702 m)   Wt 142 lb 6.4 oz (64.6 kg)   SpO2 100%   BMI 22.30 kg/m   General: Patient well-appearing, in no acute distress. Resp: normal work of breathing  GU: normal labia without rash or external lesions, thick clearish white cervical discharge noted, normal vagina, no adnexal masses or tenderness noted   GU exam performed in the presence of chaperone April Zimmerman, 02-03-1973.   ASSESSMENT/PLAN:   Vaginal discharge -wet prep and cervical ancillary testing pending -patient agreeable to vaginal testing but politely declines blood testing -up to date on PAP smear  -safe sex practices discussed    [place], DO [facility]

## 2022-01-08 ENCOUNTER — Other Ambulatory Visit: Payer: Self-pay | Admitting: Family Medicine

## 2022-01-08 DIAGNOSIS — B9689 Other specified bacterial agents as the cause of diseases classified elsewhere: Secondary | ICD-10-CM

## 2022-01-08 LAB — CERVICOVAGINAL ANCILLARY ONLY
Chlamydia: NEGATIVE
Comment: NEGATIVE
Comment: NEGATIVE
Comment: NORMAL
Neisseria Gonorrhea: NEGATIVE
Trichomonas: NEGATIVE

## 2022-01-08 MED ORDER — METRONIDAZOLE 500 MG PO TABS: 500.0000 mg | ORAL_TABLET | Freq: Two times a day (BID) | ORAL | 0 refills | Status: AC

## 2022-01-09 NOTE — Progress Notes (Signed)
Tried calling pt however pt did not  answer. Left on vm to contact the dr office back when she gets a chance.

## 2022-02-17 NOTE — Progress Notes (Deleted)
    SUBJECTIVE:   CHIEF COMPLAINT / HPI:   Vaginal Discharge: Patient is a 36 y.o. female presenting with vaginal discharge for *** days.  She states the discharge is of *** consistency.  She endorses *** vaginal odor.  She is not *** interested in screening for sexually transmitted infections today. She has contraception with *** {Contraceptives:21111124}. She {DOES NOT does:27190::"does not"} use barrier method consistently.  PERTINENT  PMH / PSH: ***None relevant  OBJECTIVE:   There were no vitals taken for this visit.   General: NAD, pleasant, able to participate in exam Respiratory: Normal effort, no obvious respiratory distress Pelvic: VULVA: normal appearing vulva with no masses, tenderness or lesions, VAGINA: Normal appearing vagina with normal color, no lesions, with {GYN VAGINAL DISCHARGE:21986} discharge present, ***CERVIX: No lesions, {GYN VAGINAL DISCHARGE:21986} discharge present  Chaperone *** CMA present for pelvic exam  ASSESSMENT/PLAN:   No problem-specific Assessment & Plan notes found for this encounter.   Assessment:  36 y.o. female with vaginal discharge for***days, as well as***.  Physical exam significant for*** discharge.  Wet prep performed today shows *** consistent with ***.  Patient is not interested in STI screening.   Plan: -Wet prep as above.  Will treat with***. -Discussed protection during intercourse and contraceptive methods*** -Follow-up as needed  Gerrit Heck, MD [place]

## 2022-02-18 ENCOUNTER — Ambulatory Visit: Payer: Medicaid Other | Admitting: Student

## 2022-06-06 ENCOUNTER — Ambulatory Visit: Payer: Medicaid Other | Admitting: Family Medicine

## 2022-06-06 NOTE — Progress Notes (Deleted)
SUBJECTIVE:   CHIEF COMPLAINT / HPI:   Erika Porter is a 37 y.o. female who presents to the [facility] clinic today to discuss the following concerns:   Nexplanon Removal and Re-insertion Nexplanon was placed 08/2019.   PERTINENT  PMH / PSH: ***  OBJECTIVE:   There were no vitals taken for this visit. ***  General: NAD, pleasant, able to participate in exam Respiratory: normal effort Skin: warm and dry, no rashes noted Psych: Normal affect and mood  ASSESSMENT/PLAN:   No problem-specific Assessment & Plan notes found for this encounter.   Progestin Implant Removal Note (PATIENT NAME) is here for removal of her etonogestrel rod implant (Implanon/Nexplanon). She would like it removed because of: ***  An informed consent was taken prior to removal and is to be scanned into the Electronic Health Record.  Risks of the procedure include: bleeding, infection, difficulty with removal, scarring and nerve damage. There may be bruising at the site of incision and down the arm.  Procedure Note: Time out taken: *** Procedure: Progestin Implant Removal Procedure confirmed by patient and team (YES/NO) Side: (RIGHT/LEFT) Position correct for procedure (YES/NO) Equipment for procedure available (YES/NO)  The patient is place in the supine position. Aseptic conditions are maintained. The rod is located by palpation. The area is cleaned with antiseptic. *** cc of 1% lidocaine with epinephrine is injected just underneath the end of the implant closest to the elbow. After firmly pressing down on the end of the implant closer to the axilla a 2-3 mm incision is made with a scalpel. The rod is pushed to the incision site and grasped with a mosquito forceps and gently removed. Blunt dissection (WAS/WAS NOT) needed. The patient (DID/DID NOT) tolerate the procedure well. The rod was removed in its entirety. The incision was dressed with a small adhesive bandage closure and a pressure dressing was  applied. An alternate plan for contraception was discussed. The patient would like to use *** for her contraception.   PRE-OP DIAGNOSIS: desired long-term, reversible contraception  POST-OP DIAGNOSIS: Same  PROCEDURE: Nexplanon  placement Performing Physician: _  Supervising Physician (if applicable): _   PROCEDURE:  ICON :  _  Negative Site (check):       [_]      Right Arm        [_]     Left Arm        Serial # _ Sterile Preparation:    [_]      Betadine        [_]     Chloraprep          Expiration Date [_]   Insertion site was selected 8 - 10 cm from medial epicondyle and marked along with guiding site using sterile marker Procedure area was prepped and draped in a sterile fashion. _ mL of 1% lidocaine _ withORwithout epinephrine used for subcutaneous anesthesia. Anesthesia confirmed.  Nexplanon  trocar was inserted subcutaneously and then Nexplanon  capsule delivered subcutaneously Trocar was removed from the insertion site. Nexplanon  capsule was palpated by provider and patient to assure satisfactory placement. Estimated blood loss of _  mL Dressings applied:     _   Adhesive Dressing     _  Gauze/Tape     _   Biocclusive Followup: The patient tolerated the procedure well without complications.  Standard post-procedure care is explained and return precautions are given.   Sharion Settler, [place]

## 2022-06-06 NOTE — Patient Instructions (Incomplete)
Nexplanon Instructions After Insertion  Keep bandage clean and dry for 24 hours  May use ice/Tylenol/Ibuprofen for soreness or pain  If you develop fever, drainage or increased warmth from incision site-contact office immediately   

## 2022-06-24 ENCOUNTER — Other Ambulatory Visit (HOSPITAL_COMMUNITY)
Admission: RE | Admit: 2022-06-24 | Discharge: 2022-06-24 | Disposition: A | Discharge status: Home / Self Care | Payer: Medicaid Other | Source: Ambulatory Visit | Attending: Family Medicine | Admitting: Family Medicine

## 2022-06-24 ENCOUNTER — Encounter: Payer: Self-pay | Admitting: Family Medicine

## 2022-06-24 ENCOUNTER — Ambulatory Visit (INDEPENDENT_AMBULATORY_CARE_PROVIDER_SITE_OTHER): Payer: Medicaid Other | Admitting: Family Medicine

## 2022-06-24 VITALS — BP 120/77 | HR 85 | Wt 151.8 lb

## 2022-06-24 DIAGNOSIS — N760 Acute vaginitis: Secondary | ICD-10-CM | POA: Diagnosis not present

## 2022-06-24 DIAGNOSIS — B9689 Other specified bacterial agents as the cause of diseases classified elsewhere: Secondary | ICD-10-CM | POA: Diagnosis not present

## 2022-06-24 DIAGNOSIS — Z3046 Encounter for surveillance of implantable subdermal contraceptive: Secondary | ICD-10-CM | POA: Diagnosis present

## 2022-06-24 DIAGNOSIS — Z113 Encounter for screening for infections with a predominantly sexual mode of transmission: Secondary | ICD-10-CM | POA: Insufficient documentation

## 2022-06-24 LAB — POCT WET PREP (WET MOUNT)
Clue Cells Wet Prep Whiff POC: POSITIVE
Trichomonas Wet Prep HPF POC: ABSENT

## 2022-06-24 MED ORDER — METRONIDAZOLE 500 MG PO TABS: 500.0000 mg | ORAL_TABLET | Freq: Two times a day (BID) | ORAL | 0 refills | Status: DC

## 2022-06-24 NOTE — Patient Instructions (Addendum)
It was wonderful to see you today.  Nexplanon Instructions After Insertion  Keep bandage clean and dry for 24 hours  May use ice/Tylenol/Ibuprofen for soreness or pain  If you develop fever, drainage or increased warmth from incision site-contact office immediately   -We are checking for sexually transmitted infections including chlamydia, gonorrhea, trichomonas. I will let you know of the results via MyChart or telephone call. We are also checking for bacterial vaginosis and yeast, which are not sexually transmitted infections.  -You should abstain from sexual activity until we have the results. If your test is positive for a sexually transmitted infection, it is important that both you and your partner are both treated.  -It is always important to use barrier protection, such as condoms, to help prevent sexually transmitted infections.    Thank you for coming to your visit as scheduled. We have had a large "no-show" problem lately, and this significantly limits our ability to see and care for patients. As a friendly reminder- if you cannot make your appointment please call to cancel. We do have a no show policy for those who do not cancel within 24 hours. Our policy is that if you miss or fail to cancel an appointment within 24 hours, 3 times in a 58-month period, you may be dismissed from our clinic.   Thank you for choosing [facility].   Please call [phone] with any questions about today's appointment.  Please be sure to schedule follow up at the front  desk before you leave today.   Sharion Settler, DO PGY-3 Family Medicine

## 2022-06-24 NOTE — Progress Notes (Signed)
    Nexplanon Removal and Insertion Erika Porter is a 37 y.o. [id number] who desires LARC with Nexplanon. She currently has a Nexplanon in place- placed 08/25/2019. She presents today for removal of current Nexplanon and for replacement.   Physical Exam Vitals:   06/24/22 1618  BP: 120/77  Pulse: 85  SpO2: 100%    Gen: Awake, alert, pleasant, appears stated age  Resp: Breathing comfortably on room air, normal effort GU: Normal appearance of labia majora and minora, without lesions. Vagina tissue pink, moist, without lesions or abrasions. There was some yellow discharge at vaginal vault. Cervix normal appearance, non-friable, with clear/yellow discharge from os.  Skin: Nexplanon palpated in left upper arm  Psych: Normal affect and mood  GU exam chaperoned by Mercer Pod, CMA   1. Routine screening for STI (sexually transmitted infection) Wet prep positive for BV, will treat with flagyl. Will await other swabs. Declined blood work.  - POCT Wet Prep [facility]) - Cervicovaginal ancillary only - Recommend barrier protection.  - metroNIDAZOLE (FLAGYL) 500 MG tablet; Take 1 tablet (500 mg total) by mouth 2 (two) times daily.  Dispense: 21 tablet; Refill: 0  2. Encounter for removal and reinsertion of Nexplanon Nexplanon removed and re-inserted. Did have difficulty with removal and it required blunt dissection. Dr. Andria Frames came and assisted with procedure. Patient tolerated well. See procedure note below.   PROCEDURE NOTE  Progestin Implant Removal and Insertion Note Erika Porter  is here for removal of her etonogestrel rod implant Nexplanon. She would like it replaced with another Nexplanon.  An informed consent was taken prior to removal and is to be scanned into the Electronic Health Record.  Risks of the procedure include: bleeding, infection, difficulty with removal, scarring and nerve damage. There may be bruising at the site of incision and down the arm.  Procedure Note: Time  out taken: 4:45 PM   Team: Dr. Sharion Settler, Dr. Madison Hickman (preceptor), Leonia Corona, [place]   Patient name and DOB confirmed: Yes Procedure: Progestin Implant Removal Procedure confirmed by patient and team: Yes  Side: LEFT  Position correct for procedure YES Equipment for procedure available YES  The patient is place in the supine position. Aseptic conditions are maintained. The rod is located by palpation. The area is cleaned with antiseptic. 5 cc of 1% lidocaine with epinephrine is injected just underneath the end of the implant closest to the elbow. After firmly pressing down on the end of the implant closer to the axilla a 2-3 mm incision is made with a scalpel. The rod is pushed to the incision site and grasped with a mosquito forceps and gently removed. Blunt dissection WAS needed. Dr. Andria Frames did come and assist with procedure and is the one who ultimately removed Nexplanon. The patient  DID tolerate the procedure well. The rod was removed in its entirety.   Nexplanon removed from packaging,  Device confirmed in needle, then inserted full length of needle and withdrawn per handbook instructions. Nexplanon was able to palpated in the patient's arm. There was minimal blood loss. The incision was closed with steri-strips, dressed with a small adhesive bandage closure and a pressure dressing was applied. The patient tolerated the procedure well and was given post procedure instructions.    Sharion Settler PGY-3 Family Medicine

## 2022-06-26 ENCOUNTER — Encounter: Payer: Self-pay | Admitting: Family Medicine

## 2022-06-26 LAB — CERVICOVAGINAL ANCILLARY ONLY
Chlamydia: NEGATIVE
Comment: NEGATIVE
Comment: NORMAL
Neisseria Gonorrhea: NEGATIVE

## 2022-07-03 MED ORDER — ETONOGESTREL 68 MG ~~LOC~~ IMPL
68.0000 mg | DRUG_IMPLANT | Freq: Once | SUBCUTANEOUS | Status: AC
  Administered 2022-06-24: 68 mg via SUBCUTANEOUS

## 2022-07-03 NOTE — Addendum Note (Signed)
Addended by: Talbot Grumbling on: 07/03/2022 12:26 PM   Modules accepted: Orders

## 2022-08-01 ENCOUNTER — Other Ambulatory Visit (HOSPITAL_COMMUNITY)
Admission: RE | Admit: 2022-08-01 | Discharge: 2022-08-01 | Disposition: A | Discharge status: Home / Self Care | Payer: Medicaid Other | Source: Ambulatory Visit | Attending: Family Medicine | Admitting: Family Medicine

## 2022-08-01 ENCOUNTER — Encounter: Payer: Self-pay | Admitting: Student

## 2022-08-01 ENCOUNTER — Ambulatory Visit (INDEPENDENT_AMBULATORY_CARE_PROVIDER_SITE_OTHER): Payer: Medicaid Other | Admitting: Student

## 2022-08-01 VITALS — BP 112/78 | HR 101 | Ht 67.0 in | Wt 152.8 lb

## 2022-08-01 DIAGNOSIS — N898 Other specified noninflammatory disorders of vagina: Secondary | ICD-10-CM | POA: Insufficient documentation

## 2022-08-01 LAB — POCT WET PREP (WET MOUNT)
Clue Cells Wet Prep Whiff POC: NEGATIVE
Trichomonas Wet Prep HPF POC: ABSENT

## 2022-08-01 MED ORDER — METRONIDAZOLE 500 MG PO TABS: 500.0000 mg | ORAL_TABLET | Freq: Two times a day (BID) | ORAL | 0 refills | Status: AC

## 2022-08-01 NOTE — Assessment & Plan Note (Signed)
Treat empirically with metronidazole.  Ordered GC chlamydia, wet prep (negative), patient politely declined blood testing.  Up-to-date on Pap.  Patient understands that if GC chlamydia are positive, will change antibiotic course.Marland Kitchen

## 2022-08-01 NOTE — Patient Instructions (Addendum)
It was great to see you today! Thank you for choosing [facility] for your primary care. Erika Porter was seen for follow up.  Today we addressed: -Continue with metronidazole x7 days twice a day with food  -Call if your symptoms continue   If you haven't already, sign up for My Chart to have easy access to your labs results, and communication with your primary care physician.  We are checking some labs today. If they are abnormal, I will call you. If they are normal, I will send you a MyChart message (if it is active) or a letter in the mail. If you do not hear about your labs in the next 2 weeks, please call the office. I recommend that you always bring your medications to each appointment as this makes it easy to ensure you are on the correct medications and helps [place] not miss refills when you need them. Call the clinic at [phone] if your symptoms worsen or you have any concerns.  You should return to our clinic Return if symptoms worsen or fail to improve. Please arrive 15 minutes before your appointment to ensure smooth check in process.  We appreciate your efforts in making this happen.  Thank you for allowing me to participate in your care, Erskine Emery, MD 08/01/2022, 2:56 PM PGY-2, [place]

## 2022-08-01 NOTE — Progress Notes (Signed)
  SUBJECTIVE:   CHIEF COMPLAINT / HPI:   Follow up: Was treated for BV for vaginal irritation on last check.  Patient reports that she has the vaginal irritation, initially improved with the metro but she lost the medicine before she finished it.  UTD on pap for 05/2021--NILM Denies abdominal pain, fever, chills, diarrhea.    PERTINENT  PMH / PSH:   History of HPV positive Pap smear, tobacco use  Patient Care Team: Erskine Emery, MD as PCP - General (Family Medicine) OBJECTIVE:  BP 112/78   Pulse (!) 101   Ht 5\' 7"  (1.702 m)   Wt 152 lb 12.8 oz (69.3 kg)   SpO2 98%   BMI 23.93 kg/m  Physical Exam  General: Alert and oriented in no apparent distress Heart: Regular rate and rhythm with no murmurs appreciated Lungs: normal wob Abdomen: no abdominal pain Skin: Warm and dry Female genitalia: Vulva: not indicated and normal appearing vulva with no masses, tenderness or lesions Vagina: not indicated and normal appearing vagina with normal color and discharge, no lesions Scant blood in the vaginal vault  Chaperoned by Marcelle Smiling   ASSESSMENT/PLAN:  Vaginal discharge Assessment & Plan: Treat empirically with metronidazole.  Ordered GC chlamydia, wet prep (negative), patient politely declined blood testing.  Up-to-date on Pap.  Patient understands that if GC chlamydia are positive, will change antibiotic course..   Orders: -     Cervicovaginal ancillary only -     POCT Wet Prep [facility]) -     metroNIDAZOLE; Take 1 tablet (500 mg total) by mouth 2 (two) times daily for 7 days.  Dispense: 14 tablet; Refill: 0   Return if symptoms worsen or fail to improve. Erskine Emery, MD 08/01/2022, 3:24 PM PGY-2, [place]

## 2022-08-02 LAB — CERVICOVAGINAL ANCILLARY ONLY
Chlamydia: NEGATIVE
Comment: NEGATIVE
Comment: NORMAL
Neisseria Gonorrhea: NEGATIVE

## 2022-08-04 ENCOUNTER — Encounter: Payer: Self-pay | Admitting: Student

## 2022-11-28 ENCOUNTER — Ambulatory Visit: Payer: Medicaid Other | Admitting: Family Medicine

## 2022-11-28 NOTE — Progress Notes (Deleted)
    SUBJECTIVE:   CHIEF COMPLAINT / HPI:   STI testing, discuss nexplanon removal.   PERTINENT  PMH / PSH: ***  OBJECTIVE:   There were no vitals taken for this visit.  ***  ASSESSMENT/PLAN:   No problem-specific Assessment & Plan notes found for this encounter.     Gerrit Heck, DO [facility]

## 2023-02-03 ENCOUNTER — Ambulatory Visit: Payer: MEDICAID | Admitting: Family Medicine

## 2023-02-03 NOTE — Progress Notes (Deleted)
    SUBJECTIVE:   CHIEF COMPLAINT / HPI:   Pap, std testing, birth control discussion  PERTINENT  PMH / PSH: ***  OBJECTIVE:   There were no vitals taken for this visit.  ***  ASSESSMENT/PLAN:   No problem-specific Assessment & Plan notes found for this encounter.     Gerrit Heck, DO [facility]

## 2023-02-05 ENCOUNTER — Ambulatory Visit: Payer: MEDICAID | Admitting: Family Medicine

## 2023-03-28 ENCOUNTER — Ambulatory Visit (INDEPENDENT_AMBULATORY_CARE_PROVIDER_SITE_OTHER): Payer: MEDICAID | Admitting: Family Medicine

## 2023-03-28 VITALS — BP 127/87 | HR 84 | Ht 67.0 in | Wt 150.4 lb

## 2023-03-28 DIAGNOSIS — R0789 Other chest pain: Secondary | ICD-10-CM

## 2023-03-28 DIAGNOSIS — S29011A Strain of muscle and tendon of front wall of thorax, initial encounter: Secondary | ICD-10-CM | POA: Diagnosis not present

## 2023-03-28 MED ORDER — MELOXICAM 7.5 MG PO TABS: 7.5000 mg | ORAL_TABLET | Freq: Every day | ORAL | 0 refills | Status: AC

## 2023-03-28 NOTE — Patient Instructions (Addendum)
I have ordered an x-ray of your chest. I have also sent in meloxicam to help with the muscle pain and inflammation. Let us know if you continue to get worse or do not improve. Be sure to follow up with your therapist.
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
## 2023-03-28 NOTE — Assessment & Plan Note (Signed)
Symptoms of muscle pain, most pronounced over the right chest wall, are most likely muscle strain after the fall, especially with point tenderness to the area on my exam.  Reassured by vitals and normal cardiopulmonary exam as well as by patient's active status in the room.  Will send in meloxicam 7.5 mg to be taken daily for 7 days to help inflammation.  Given continued pain and preference, will order chest x-ray to further evaluate for rib fracture.  Will follow-up results.

## 2023-04-04 ENCOUNTER — Ambulatory Visit: Payer: MEDICAID

## 2023-06-11 ENCOUNTER — Other Ambulatory Visit (HOSPITAL_COMMUNITY)
Admission: RE | Admit: 2023-06-11 | Discharge: 2023-06-11 | Disposition: A | Discharge status: Home / Self Care | Payer: MEDICAID | Source: Ambulatory Visit | Attending: Family Medicine | Admitting: Family Medicine

## 2023-06-11 ENCOUNTER — Ambulatory Visit (INDEPENDENT_AMBULATORY_CARE_PROVIDER_SITE_OTHER): Payer: MEDICAID | Admitting: Student

## 2023-06-11 VITALS — BP 120/76 | HR 90 | Ht 67.0 in | Wt 142.0 lb

## 2023-06-11 DIAGNOSIS — B9689 Other specified bacterial agents as the cause of diseases classified elsewhere: Secondary | ICD-10-CM | POA: Diagnosis not present

## 2023-06-11 DIAGNOSIS — N760 Acute vaginitis: Secondary | ICD-10-CM | POA: Diagnosis not present

## 2023-06-11 DIAGNOSIS — Z113 Encounter for screening for infections with a predominantly sexual mode of transmission: Secondary | ICD-10-CM | POA: Insufficient documentation

## 2023-06-11 DIAGNOSIS — N898 Other specified noninflammatory disorders of vagina: Secondary | ICD-10-CM

## 2023-06-11 LAB — POCT WET PREP (WET MOUNT)
Clue Cells Wet Prep Whiff POC: POSITIVE
Trichomonas Wet Prep HPF POC: ABSENT

## 2023-06-11 LAB — CERVICOVAGINAL ANCILLARY ONLY
Chlamydia: NEGATIVE
Chlamydia: NEGATIVE
Comment: NEGATIVE
Comment: NEGATIVE
Comment: NORMAL
Comment: NORMAL
Neisseria Gonorrhea: NEGATIVE
Neisseria Gonorrhea: NEGATIVE

## 2023-06-11 MED ORDER — METRONIDAZOLE 500 MG PO TABS: 500.0000 mg | ORAL_TABLET | Freq: Three times a day (TID) | ORAL | 0 refills | Status: DC

## 2023-06-11 NOTE — Assessment & Plan Note (Signed)
Point-of-care wet prep positive for BV.  Metronidazole 5-day course sent to pharmacy.  GC swab for vagina and throat sent for culture.  She declined RPR and HIV testing today

## 2023-06-11 NOTE — Progress Notes (Signed)
    SUBJECTIVE:   CHIEF COMPLAINT / HPI:   Erika Porter is a 38 y.o. female  presenting for vaginal discharge and odor.   Patient reports vaginal odor, irritation, pain ongoing for a couple of weeks. She denies having new partners. She currently has a nexplanon for birth control.   PERTINENT  PMH / PSH: Reviewed and updated   OBJECTIVE:   BP 120/76   Pulse 90   Ht 5\' 7"  (1.702 m)   Wt 142 lb (64.4 kg)   SpO2 100%   BMI 22.24 kg/m   Well-appearing, no acute distress Cardio: Regular rate, regular rhythm, no murmurs on exam. Pulm: Clear, no wheezing, no crackles. No increased work of breathing Abdominal: bowel sounds present, soft, non-tender, non-distended Extremities: no peripheral edema  Neuro: alert and oriented x3, speech normal in content, no facial asymmetry, strength intact and equal bilaterally in UE and LE, pupils equal and reactive to light.   Pelvic Exam: MA chaperone present  Normal external genitalia + Abnormal discharge and odor No cervical motion tenderness  Cervix visualized with no lesions       03/28/2023    9:03 AM 08/01/2022    2:00 PM 01/07/2022    4:06 PM  PHQ9 SCORE ONLY  PHQ-9 Total Score 18 7 12       ASSESSMENT/PLAN:   Vaginal discharge Point-of-care wet prep positive for BV.  Metronidazole 5-day course sent to pharmacy.  GC swab for vagina and throat sent for culture.  She declined RPR and HIV testing today     Glendale Chard, DO [facility] [place] [facility]

## 2023-06-11 NOTE — Patient Instructions (Signed)
I will call you with your results.  If I need to send antibiotics I will send it to your pharmacy.

## 2023-06-16 ENCOUNTER — Encounter: Payer: Self-pay | Admitting: Student

## 2023-06-16 NOTE — Progress Notes (Signed)
Negative GC. Result letter routed to staff to mail to patient.   Glendale Chard, DO [facility], PGY-2 06/16/23 10:40 AM

## 2023-12-30 ENCOUNTER — Other Ambulatory Visit (HOSPITAL_COMMUNITY)
Admission: RE | Admit: 2023-12-30 | Discharge: 2023-12-30 | Disposition: A | Discharge status: Home / Self Care | Source: Ambulatory Visit | Attending: Family Medicine | Admitting: Family Medicine

## 2023-12-30 ENCOUNTER — Encounter: Payer: Self-pay | Admitting: Family Medicine

## 2023-12-30 ENCOUNTER — Ambulatory Visit: Admitting: Family Medicine

## 2023-12-30 VITALS — BP 100/71 | HR 91 | Ht 67.0 in | Wt 147.8 lb

## 2023-12-30 DIAGNOSIS — N898 Other specified noninflammatory disorders of vagina: Secondary | ICD-10-CM | POA: Diagnosis not present

## 2023-12-30 DIAGNOSIS — Z113 Encounter for screening for infections with a predominantly sexual mode of transmission: Secondary | ICD-10-CM | POA: Diagnosis not present

## 2023-12-30 LAB — POCT WET PREP (WET MOUNT)
Clue Cells Wet Prep Whiff POC: POSITIVE
Trichomonas Wet Prep HPF POC: ABSENT

## 2023-12-30 MED ORDER — METRONIDAZOLE 0.75 % VA GEL: 1.0000 | Freq: Every day | VAGINAL | 0 refills | Status: DC

## 2023-12-30 NOTE — Patient Instructions (Addendum)
 It was wonderful to see you today! Thank you for choosing [facility].   Please bring ALL of your medications with you to every visit.   Today we talked about:  We swabbed you for vaginal infections and STI's.  I will follow-up with you regarding those results if you need treatment.  Please try to avoid using any fragrance products in your vaginal region.  If you do have any known exposures please come back into treatment.  Please follow up as needed for persistent symptoms  If you haven't already, sign up for My Chart to have easy access to your labs results, and communication with your primary care physician.   We are checking some labs today. If they are abnormal, I will call you. If they are normal, I will send you a MyChart message (if it is active) or a letter in the mail. If you do not hear about your labs in the next 2 weeks, please call the office.  Call the clinic at [phone] if your symptoms worsen or you have any concerns.  Please be sure to schedule follow up at the front desk before you leave today.   Izetta Nap, DO Family Medicine

## 2023-12-30 NOTE — Progress Notes (Signed)
    SUBJECTIVE:   CHIEF COMPLAINT / HPI:   Vaginal irritation Ongoing for the past couple of days.  Would like swabs and STI testing including blood work today.  No known exposures.  Using Nexplanon  for birth control.  Stopped her menstrual cycle 1 to 2 weeks ago.  Denies dysuria or urinary frequency.  Denies abdominal pain.  PERTINENT  PMH / PSH: Tobacco use disorder  OBJECTIVE:   BP 100/71   Pulse 91   Ht 5' 7 (1.702 m)   Wt 147 lb 12.8 oz (67 kg)   SpO2 100%   BMI 23.15 kg/m    General: NAD, pleasant, able to participate in exam Pelvic exam: normal external genitalia, vulva, vagina, cervix, uterus and adnexa, VAGINA: normal appearing vagina with normal color and discharge, no lesions, vaginal discharge - white and creamy, exam chaperoned by Dayshia, CMA.  ASSESSMENT/PLAN:   Assessment & Plan Vaginal irritation Wet prep consistent with BV, symptomatic treatment.  Obtain G/C swab, will follow-up results.  Ordered HIV, RPR and hep C blood work to be done, patient did not obtain before leaving clinic, will schedule an appointment to complete. -MetroGel  nightly x 5 days -Follow-up G/C    Dr. Izetta Nap, DO [facility] Center

## 2023-12-31 LAB — CERVICOVAGINAL ANCILLARY ONLY
Chlamydia: NEGATIVE
Comment: NEGATIVE
Comment: NORMAL
Neisseria Gonorrhea: NEGATIVE

## 2024-01-01 ENCOUNTER — Ambulatory Visit: Payer: Self-pay | Admitting: Family Medicine

## 2024-01-01 NOTE — Telephone Encounter (Signed)
 Negative for G/C. Called at patient request, did not answer and unable to leave voicemail. Letter sent.

## 2024-01-02 DIAGNOSIS — F331 Major depressive disorder, recurrent, moderate: Secondary | ICD-10-CM | POA: Diagnosis not present

## 2024-01-02 DIAGNOSIS — F411 Generalized anxiety disorder: Secondary | ICD-10-CM | POA: Diagnosis not present

## 2024-01-03 DIAGNOSIS — F411 Generalized anxiety disorder: Secondary | ICD-10-CM | POA: Diagnosis not present

## 2024-01-03 DIAGNOSIS — F331 Major depressive disorder, recurrent, moderate: Secondary | ICD-10-CM | POA: Diagnosis not present

## 2024-01-05 DIAGNOSIS — F411 Generalized anxiety disorder: Secondary | ICD-10-CM | POA: Diagnosis not present

## 2024-01-05 DIAGNOSIS — F331 Major depressive disorder, recurrent, moderate: Secondary | ICD-10-CM | POA: Diagnosis not present

## 2024-01-09 DIAGNOSIS — F411 Generalized anxiety disorder: Secondary | ICD-10-CM | POA: Diagnosis not present

## 2024-01-09 DIAGNOSIS — F331 Major depressive disorder, recurrent, moderate: Secondary | ICD-10-CM | POA: Diagnosis not present

## 2024-01-12 DIAGNOSIS — F331 Major depressive disorder, recurrent, moderate: Secondary | ICD-10-CM | POA: Diagnosis not present

## 2024-01-12 DIAGNOSIS — F411 Generalized anxiety disorder: Secondary | ICD-10-CM | POA: Diagnosis not present

## 2024-01-14 DIAGNOSIS — F411 Generalized anxiety disorder: Secondary | ICD-10-CM | POA: Diagnosis not present

## 2024-01-14 DIAGNOSIS — F331 Major depressive disorder, recurrent, moderate: Secondary | ICD-10-CM | POA: Diagnosis not present

## 2024-01-20 DIAGNOSIS — F411 Generalized anxiety disorder: Secondary | ICD-10-CM | POA: Diagnosis not present

## 2024-01-20 DIAGNOSIS — F331 Major depressive disorder, recurrent, moderate: Secondary | ICD-10-CM | POA: Diagnosis not present

## 2024-01-23 DIAGNOSIS — F331 Major depressive disorder, recurrent, moderate: Secondary | ICD-10-CM | POA: Diagnosis not present

## 2024-01-23 DIAGNOSIS — F411 Generalized anxiety disorder: Secondary | ICD-10-CM | POA: Diagnosis not present

## 2024-01-26 DIAGNOSIS — F411 Generalized anxiety disorder: Secondary | ICD-10-CM | POA: Diagnosis not present

## 2024-01-26 DIAGNOSIS — F331 Major depressive disorder, recurrent, moderate: Secondary | ICD-10-CM | POA: Diagnosis not present

## 2024-01-29 DIAGNOSIS — F331 Major depressive disorder, recurrent, moderate: Secondary | ICD-10-CM | POA: Diagnosis not present

## 2024-01-29 DIAGNOSIS — F411 Generalized anxiety disorder: Secondary | ICD-10-CM | POA: Diagnosis not present

## 2024-02-03 DIAGNOSIS — F411 Generalized anxiety disorder: Secondary | ICD-10-CM | POA: Diagnosis not present

## 2024-02-03 DIAGNOSIS — F331 Major depressive disorder, recurrent, moderate: Secondary | ICD-10-CM | POA: Diagnosis not present

## 2024-02-06 DIAGNOSIS — F331 Major depressive disorder, recurrent, moderate: Secondary | ICD-10-CM | POA: Diagnosis not present

## 2024-02-06 DIAGNOSIS — F411 Generalized anxiety disorder: Secondary | ICD-10-CM | POA: Diagnosis not present

## 2024-02-10 DIAGNOSIS — F411 Generalized anxiety disorder: Secondary | ICD-10-CM | POA: Diagnosis not present

## 2024-02-10 DIAGNOSIS — F331 Major depressive disorder, recurrent, moderate: Secondary | ICD-10-CM | POA: Diagnosis not present

## 2024-02-13 DIAGNOSIS — F411 Generalized anxiety disorder: Secondary | ICD-10-CM | POA: Diagnosis not present

## 2024-02-13 DIAGNOSIS — F331 Major depressive disorder, recurrent, moderate: Secondary | ICD-10-CM | POA: Diagnosis not present

## 2024-03-05 ENCOUNTER — Ambulatory Visit (INDEPENDENT_AMBULATORY_CARE_PROVIDER_SITE_OTHER): Admitting: Family Medicine

## 2024-03-05 ENCOUNTER — Encounter: Payer: Self-pay | Admitting: Family Medicine

## 2024-03-05 VITALS — BP 118/74 | HR 67 | Ht 67.0 in | Wt 148.6 lb

## 2024-03-05 DIAGNOSIS — M545 Low back pain, unspecified: Secondary | ICD-10-CM | POA: Diagnosis not present

## 2024-03-05 DIAGNOSIS — G8929 Other chronic pain: Secondary | ICD-10-CM

## 2024-03-05 MED ORDER — MELOXICAM 7.5 MG PO TABS: 7.5000 mg | ORAL_TABLET | Freq: Every day | ORAL | 0 refills | Status: AC

## 2024-03-05 NOTE — Patient Instructions (Addendum)
 VISIT SUMMARY: During your visit, we discussed your worsening back pain, which has been affecting your sleep and daily activities.  YOUR PLAN: LUMBAR MUSCLE STRAIN: Your back pain is due to a muscle strain in your lower back, which has been made worse by prolonged standing. -Take meloxicam  as prescribed to help manage the pain. -Start physical therapy to strengthen your muscles and aid in rehabilitation. -Use heating pads on your back to help relieve the pain. -I have given you exercises for your back. -Let me know if this does not improve.  Please let me know if you have any other questions.  Dr. Tharon

## 2024-03-05 NOTE — Progress Notes (Signed)
   SUBJECTIVE:   CHIEF COMPLAINT / HPI:  Discussed the use of AI scribe software for clinical note transcription with the patient, who gave verbal consent to proceed.  History of Present Illness Erika Porter is a 38 year old female who presents with worsening back pain.  She has experienced back pain for several years, with recent exacerbation after prolonged standing while doing her hair three to four days ago. The pain is located in the middle and lower back, radiating to her buttocks, and is described as stinging and burning. It causes difficulty in getting up in the morning and has disrupted her sleep for the past three nights.  She typically uses two pillows for back support during sleep, but this has not been effective recently. A previous episode of back pain a few months ago was alleviated with medication. She is mostly sedentary and experiences increased pain when standing for long periods.  She reports occasional numbness in her legs, particularly in the thighs, where she can pinch herself without feeling pain; however, she feels this could be anxiety. She denies any falls or incontinence and does not report any true weakness.   OBJECTIVE:  BP 118/74   Pulse 67   Ht 5' 7 (1.702 m)   Wt 148 lb 9.6 oz (67.4 kg)   LMP  (LMP Unknown) Comment: approximate mid September 2025.  SpO2 99%   BMI 23.27 kg/m   Physical Exam GENERAL: Alert, cooperative, well developed, no acute distress. HEENT: Normocephalic, normal oropharynx, moist mucous membranes. EXTREMITIES: No cyanosis or edema. MUSCULOSKELETAL: No tenderness on palpation of the back. NEUROLOGICAL: Cranial nerves grossly intact, moves all extremities without gross motor or sensory deficit. Normal motor strength and coordination in lower extremities. Sensation intact in lower extremities. Normal gait.  ASSESSMENT/PLAN:  Assessment and Plan Assessment & Plan Lumbar muscle strain   Chronic lumbar strain is exacerbated by  increase in activity and prolonged standing, causing severe pain radiating to the buttocks and difficulty sleeping. There is no neurological compromise today with normal sensation and motor function, and symptoms are consistent with muscle strain. Prescribed meloxicam  for pain management since this has worked before. Referred to physical therapy for muscle strengthening and rehabilitation. Advised use of heating pads for symptomatic relief.    Stuart Redo, MD [facility]

## 2024-03-15 DIAGNOSIS — F331 Major depressive disorder, recurrent, moderate: Secondary | ICD-10-CM | POA: Diagnosis not present

## 2024-03-20 DIAGNOSIS — F331 Major depressive disorder, recurrent, moderate: Secondary | ICD-10-CM | POA: Diagnosis not present

## 2024-03-20 DIAGNOSIS — F411 Generalized anxiety disorder: Secondary | ICD-10-CM | POA: Diagnosis not present

## 2024-03-25 DIAGNOSIS — F331 Major depressive disorder, recurrent, moderate: Secondary | ICD-10-CM | POA: Diagnosis not present

## 2024-03-27 DIAGNOSIS — F331 Major depressive disorder, recurrent, moderate: Secondary | ICD-10-CM | POA: Diagnosis not present

## 2024-03-27 DIAGNOSIS — F411 Generalized anxiety disorder: Secondary | ICD-10-CM | POA: Diagnosis not present

## 2024-03-29 DIAGNOSIS — F331 Major depressive disorder, recurrent, moderate: Secondary | ICD-10-CM | POA: Diagnosis not present

## 2024-03-29 DIAGNOSIS — F411 Generalized anxiety disorder: Secondary | ICD-10-CM | POA: Diagnosis not present

## 2024-04-02 DIAGNOSIS — F411 Generalized anxiety disorder: Secondary | ICD-10-CM | POA: Diagnosis not present

## 2024-04-02 DIAGNOSIS — F331 Major depressive disorder, recurrent, moderate: Secondary | ICD-10-CM | POA: Diagnosis not present

## 2024-04-06 DIAGNOSIS — F331 Major depressive disorder, recurrent, moderate: Secondary | ICD-10-CM | POA: Diagnosis not present

## 2024-04-08 DIAGNOSIS — F331 Major depressive disorder, recurrent, moderate: Secondary | ICD-10-CM | POA: Diagnosis not present

## 2024-04-13 DIAGNOSIS — F331 Major depressive disorder, recurrent, moderate: Secondary | ICD-10-CM | POA: Diagnosis not present

## 2024-04-16 DIAGNOSIS — F411 Generalized anxiety disorder: Secondary | ICD-10-CM | POA: Diagnosis not present

## 2024-04-16 DIAGNOSIS — F331 Major depressive disorder, recurrent, moderate: Secondary | ICD-10-CM | POA: Diagnosis not present

## 2024-04-19 DIAGNOSIS — F331 Major depressive disorder, recurrent, moderate: Secondary | ICD-10-CM | POA: Diagnosis not present

## 2024-04-22 DIAGNOSIS — F331 Major depressive disorder, recurrent, moderate: Secondary | ICD-10-CM | POA: Diagnosis not present

## 2024-04-26 DIAGNOSIS — F331 Major depressive disorder, recurrent, moderate: Secondary | ICD-10-CM | POA: Diagnosis not present

## 2024-04-30 DIAGNOSIS — F331 Major depressive disorder, recurrent, moderate: Secondary | ICD-10-CM | POA: Diagnosis not present

## 2024-05-03 DIAGNOSIS — F331 Major depressive disorder, recurrent, moderate: Secondary | ICD-10-CM | POA: Diagnosis not present

## 2024-05-05 DIAGNOSIS — F331 Major depressive disorder, recurrent, moderate: Secondary | ICD-10-CM | POA: Diagnosis not present

## 2024-05-05 DIAGNOSIS — F411 Generalized anxiety disorder: Secondary | ICD-10-CM | POA: Diagnosis not present

## 2024-05-10 DIAGNOSIS — F331 Major depressive disorder, recurrent, moderate: Secondary | ICD-10-CM | POA: Diagnosis not present

## 2024-05-14 DIAGNOSIS — F331 Major depressive disorder, recurrent, moderate: Secondary | ICD-10-CM | POA: Diagnosis not present

## 2024-05-31 ENCOUNTER — Ambulatory Visit: Payer: Self-pay

## 2024-05-31 ENCOUNTER — Other Ambulatory Visit (HOSPITAL_COMMUNITY)
Admission: RE | Admit: 2024-05-31 | Discharge: 2024-05-31 | Disposition: A | Discharge status: Home / Self Care | Source: Ambulatory Visit | Attending: Family Medicine | Admitting: Family Medicine

## 2024-05-31 VITALS — BP 140/80 | HR 67 | Wt 151.6 lb

## 2024-05-31 DIAGNOSIS — N898 Other specified noninflammatory disorders of vagina: Secondary | ICD-10-CM | POA: Diagnosis present

## 2024-05-31 DIAGNOSIS — F321 Major depressive disorder, single episode, moderate: Secondary | ICD-10-CM | POA: Diagnosis present

## 2024-05-31 DIAGNOSIS — Z0000 Encounter for general adult medical examination without abnormal findings: Secondary | ICD-10-CM

## 2024-05-31 DIAGNOSIS — N760 Acute vaginitis: Secondary | ICD-10-CM

## 2024-05-31 DIAGNOSIS — B9689 Other specified bacterial agents as the cause of diseases classified elsewhere: Secondary | ICD-10-CM

## 2024-05-31 DIAGNOSIS — F17200 Nicotine dependence, unspecified, uncomplicated: Secondary | ICD-10-CM

## 2024-05-31 DIAGNOSIS — Z862 Personal history of diseases of the blood and blood-forming organs and certain disorders involving the immune mechanism: Secondary | ICD-10-CM

## 2024-05-31 LAB — POCT WET PREP (WET MOUNT)
Clue Cells Wet Prep Whiff POC: POSITIVE
Trichomonas Wet Prep HPF POC: ABSENT

## 2024-05-31 MED ORDER — FLUOXETINE HCL 20 MG PO CAPS: 20.0000 mg | ORAL_CAPSULE | Freq: Every day | ORAL | 3 refills | Status: AC

## 2024-05-31 MED ORDER — METRONIDAZOLE 0.75 % VA GEL: 1.0000 | Freq: Every day | VAGINAL | 0 refills | Status: AC

## 2024-05-31 MED ORDER — NICOTINE 14 MG/24HR TD PT24: 14.0000 mg | MEDICATED_PATCH | Freq: Every day | TRANSDERMAL | 0 refills | Status: AC

## 2024-05-31 NOTE — Patient Instructions (Addendum)
 Thank you for visiting the clinic today, it was good to see you!  Please always bring your medication bottles  In today's visit we discussed:  Depression: I suspect many of your symptoms of numbness/tingling/sleep/little interest are related to depression.  We will start a 6 to 8-week trial of taking Prozac  20 mg every day.  It takes several weeks for the true effect of medication to be seen, so please have a discussion with a provider before discontinuing.  Also put in a referral for you to speak with a therapist.  Vaginal discharge: We collected a wet prep today, and I will send in a prescription for metronidazole  gel if it is positive for bacterial vaginosis as we suspected.  Tobacco use: I will suspect this may be contributing to shortness of breath, I will send in a prescription for nicotine  patches, use this once a day.  If it does not work and we will try different treatments.  Health maintenance: I have collected routine blood work to make sure we are not missing any chronic health problems common as we age.  Please follow-up in 1 to 2 months  For any questions, please call the office at [phone] or send me a message in MyChart. Have a great day!  -Fairy Amy, MD  [facility] Family Medicine Resident, PGY-1

## 2024-05-31 NOTE — Assessment & Plan Note (Signed)
 History of recurrent BV infections, symptoms and pelvic exam consistent with prior infections.  Prefers gel over oral medications GC chlamydia probe pending wet prep with moderate clue cells and many bacteria, positive whiff test Order placed for MetroGel  to apply nightly for 5 days

## 2024-05-31 NOTE — Progress Notes (Signed)
 "   SUBJECTIVE:   CHIEF COMPLAINT / HPI:   Numbness: In back and thighs, intermittent. Started first in legs a few years ago. Recently has noticed more symptoms in her back. Mostly concerned about the numbness over the back. Hard to determine when symptoms started. No difficulty with ambulation.  Mood: Reports she has been feeling more moody recently. Not very good talking about emotions, feels more bored like there's nothing to do in her life anymore. Insomnia is her main symptom, feels restless trying to fall asleep and reports 0 appetite.  Symptoms been ongoing for several months.  Does get sleep after kids are in school. Never been on an SSRI. Spoke with a therapist as a kid which helped a little. Has racing thoughts at night. No history of high energy, working on projects for days on end, or delusions of grandeur.  No SI/HI.  SOB: Randomly feels that she needs to catch her breath. Worse when laying down, feels more in her throat and deep chest. Goes back to normal after a few seconds. Smokes <1/2 pack per day for about 20 years, smokes more when she's mad. Has tried quitting cigarettes a few times with no success. Feels like she can quit cold turkey.  Vaginal discharge: More irritation over the last few days, very similar to previous flares that she has had. No recent or new sexual partners.  PERTINENT  PMH / PSH: Tobacco use  OBJECTIVE:   Wt 151 lb 9.6 oz (68.8 kg)   BMI 23.74 kg/m    Cardiac: Regular rate and rhythm. Normal S1/S2. No murmurs, rubs, or gallops appreciated. Lungs: Clear bilaterally to ascultation.  Abdomen: Normoactive bowel sounds. No tenderness to deep or light palpation. No rebound or guarding.  Pelvis: Vulva symmetric and normal-appearing, no erythema or tenderness, vaginal wall normal-appearing with moderate milky discharge, no notable odor. Psych: Pleasant and appropriate      05/31/2024    2:34 PM 03/05/2024    2:06 PM 12/30/2023   10:58 AM 03/28/2023     9:03 AM 08/01/2022    2:00 PM  Depression screen PHQ 2/9  Decreased Interest 3 3 2 3 3   Down, Depressed, Hopeless 3 3 1 3  0  PHQ - 2 Score 6 6 3 6 3   Altered sleeping 3 3 3 3 3   Tired, decreased energy 3 3 3 3 1   Change in appetite 3 3 1 3  0  Feeling bad or failure about yourself  0 3 1 1  0  Trouble concentrating 0 3 3 1  0  Moving slowly or fidgety/restless 0 3 1 1  0  Suicidal thoughts 0 0 0 0 0  PHQ-9 Score 15 24  15  18  7    Difficult doing work/chores Extremely dIfficult Extremely dIfficult   Not difficult at all     Data saved with a previous flowsheet row definition     ASSESSMENT/PLAN:   Assessment & Plan Depression, major, single episode, moderate (HCC) Has had history of low mood and little interest in things that used to pleasure several months now.  PHQ-9 score 15, last survey scored 24 both significantly affecting her ability to work.  No history of SSRIs, SI/HI.  Denies any manic symptoms. Extensive discussion had regarding side effects of medication and benefits of treatment.  Patient lost her phone and would like to come and pick up summary of results tomorrow afternoon, if she is not contacted then we will call sister. Start fluoxetine  20 mg daily VBCI a  referral placed for counseling for depression/caregiver stress 1 month f/u History of anemia Has had anemia in the past most commonly associated with pregnancy, last ferritin was within normal limits.  Endorsing low mood and low energy. CBC Tobacco Use Unclear how much effort she put in in the past to quit smoking, but does have a desire to quit.  Endorsing intermittent shortness of breath that spontaneously resolves after few seconds. Nicotine  patch 14 mg daily as she is smoking half pack a day Bacterial vaginosis History of recurrent BV infections, symptoms and pelvic exam consistent with prior infections.  Prefers gel over oral medications GC chlamydia probe pending wet prep with moderate clue cells and many  bacteria, positive whiff test Order placed for [employer]  to apply nightly for 5 days Routine adult health maintenance Has not had recent lab work done due to monitor electrolytes and lipid.  BMP and lipid panel ordered     Fairy Amy, MD [facility] "

## 2024-05-31 NOTE — Assessment & Plan Note (Addendum)
 Unclear how much effort she put in in the past to quit smoking, but does have a desire to quit.  Endorsing intermittent shortness of breath that spontaneously resolves after few seconds. Nicotine  patch 14 mg daily as she is smoking half pack a day

## 2024-06-01 LAB — CERVICOVAGINAL ANCILLARY ONLY
Chlamydia: NEGATIVE
Comment: NEGATIVE
Comment: NEGATIVE
Comment: NORMAL
Neisseria Gonorrhea: NEGATIVE
Trichomonas: NEGATIVE

## 2024-06-01 LAB — BASIC METABOLIC PANEL WITH GFR
BUN/Creatinine Ratio: 10 (ref 9–23)
BUN: 7 mg/dL (ref 6–20)
CO2: 21 mmol/L (ref 20–29)
Calcium: 8.9 mg/dL (ref 8.7–10.2)
Chloride: 104 mmol/L (ref 96–106)
Creatinine, Ser: 0.7 mg/dL (ref 0.57–1.00)
Glucose: 78 mg/dL (ref 70–99)
Potassium: 3.8 mmol/L (ref 3.5–5.2)
Sodium: 139 mmol/L (ref 134–144)
eGFR: 113 mL/min/1.73

## 2024-06-01 LAB — CBC WITH DIFFERENTIAL/PLATELET
Basophils Absolute: 0 x10E3/uL (ref 0.0–0.2)
Basos: 1 %
EOS (ABSOLUTE): 0.3 x10E3/uL (ref 0.0–0.4)
Eos: 6 %
Hematocrit: 36.5 % (ref 34.0–46.6)
Hemoglobin: 11.4 g/dL (ref 11.1–15.9)
Immature Grans (Abs): 0 x10E3/uL (ref 0.0–0.1)
Immature Granulocytes: 0 %
Lymphocytes Absolute: 1.9 x10E3/uL (ref 0.7–3.1)
Lymphs: 38 %
MCH: 26.9 pg (ref 26.6–33.0)
MCHC: 31.2 g/dL — ABNORMAL LOW (ref 31.5–35.7)
MCV: 86 fL (ref 79–97)
Monocytes Absolute: 0.5 x10E3/uL (ref 0.1–0.9)
Monocytes: 10 %
Neutrophils Absolute: 2.3 x10E3/uL (ref 1.4–7.0)
Neutrophils: 45 %
Platelets: 229 x10E3/uL (ref 150–450)
RBC: 4.24 x10E6/uL (ref 3.77–5.28)
RDW: 14.1 % (ref 11.7–15.4)
WBC: 5 x10E3/uL (ref 3.4–10.8)

## 2024-06-01 LAB — LIPID PANEL
Chol/HDL Ratio: 1.9 ratio (ref 0.0–4.4)
Cholesterol, Total: 187 mg/dL (ref 100–199)
HDL: 98 mg/dL
LDL Chol Calc (NIH): 77 mg/dL (ref 0–99)
Triglycerides: 65 mg/dL (ref 0–149)
VLDL Cholesterol Cal: 12 mg/dL (ref 5–40)

## 2024-06-04 ENCOUNTER — Telehealth: Payer: Self-pay | Admitting: *Deleted

## 2024-06-04 NOTE — Progress Notes (Unsigned)
 Complex Care Management Note Care Guide Note  06/04/2024 Name: Erika Porter MRN: [id number] DOB: December 07, 1985   Complex Care Management Outreach Attempts: An unsuccessful telephone outreach was attempted today to offer the patient information about available complex care management services.  Follow Up Plan:  Additional outreach attempts will be made to offer the patient complex care management information and services.   Encounter Outcome:  No Answer  Erika Porter  [facility]  [facility], [facility] Guide  Direct Dial: [phone]  Fax [phone]

## 2024-06-07 NOTE — Progress Notes (Signed)
 Complex Care Management Note  Care Guide Note 06/07/2024 Name: MERL GUARDINO MRN: [id number] DOB: 1985/05/25  HUYEN PERAZZO is a 39 y.o. year old female who sees Lorrane Pac, MD for primary care. I reached out to Harlene CHRISTELLA Ahle by phone today to offer complex care management services.  Ms. Dahmer was given information about Complex Care Management services today including:   The Complex Care Management services include support from the care team which includes your Nurse Care Manager, Clinical Social Worker, or Pharmacist.  The Complex Care Management team is here to help remove barriers to the health concerns and goals most important to you. Complex Care Management services are voluntary, and the patient may decline or stop services at any time by request to their care team member.   Complex Care Management Consent Status: Patient agreed to services and verbal consent obtained.   Follow up plan:  Telephone appointment with complex care management team member scheduled for:  06/30/24  Encounter Outcome:  Patient Scheduled  Harlene Satterfield  [facility]  [facility], [facility] Guide  Direct Dial: [phone]  Fax [phone]

## 2024-06-30 ENCOUNTER — Telehealth: Admitting: Licensed Clinical Social Worker

## 2024-07-01 ENCOUNTER — Ambulatory Visit: Payer: Self-pay
